# Patient Record
Sex: Female | Born: 1940 | Race: Black or African American | Hispanic: No | Smoking: Current every day smoker
Health system: Southern US, Community
[De-identification: ages and names within clinical notes are randomized; demographics above are authoritative.]

## PROBLEM LIST (undated history)

## (undated) DIAGNOSIS — I8390 Asymptomatic varicose veins of unspecified lower extremity: Secondary | ICD-10-CM

## (undated) DIAGNOSIS — R0902 Hypoxemia: Secondary | ICD-10-CM

## (undated) DIAGNOSIS — I739 Peripheral vascular disease, unspecified: Secondary | ICD-10-CM

## (undated) DIAGNOSIS — M1710 Unilateral primary osteoarthritis, unspecified knee: Secondary | ICD-10-CM

## (undated) DIAGNOSIS — E785 Hyperlipidemia, unspecified: Secondary | ICD-10-CM

## (undated) DIAGNOSIS — I10 Essential (primary) hypertension: Secondary | ICD-10-CM

## (undated) DIAGNOSIS — M79609 Pain in unspecified limb: Secondary | ICD-10-CM

## (undated) DIAGNOSIS — IMO0001 Reserved for inherently not codable concepts without codable children: Secondary | ICD-10-CM

## (undated) DIAGNOSIS — K219 Gastro-esophageal reflux disease without esophagitis: Secondary | ICD-10-CM

## (undated) DIAGNOSIS — M179 Osteoarthritis of knee, unspecified: Secondary | ICD-10-CM

## (undated) DIAGNOSIS — Z8679 Personal history of other diseases of the circulatory system: Secondary | ICD-10-CM

## (undated) HISTORY — DX: Unilateral primary osteoarthritis, unspecified knee: M17.10

## (undated) HISTORY — DX: Reserved for inherently not codable concepts without codable children: IMO0001

## (undated) HISTORY — PX: COLON SURGERY: SHX602

## (undated) HISTORY — PX: CHOLECYSTECTOMY: SHX55

## (undated) HISTORY — DX: Hyperlipidemia, unspecified: E78.5

## (undated) HISTORY — PX: HEMORRHOID SURGERY: SHX153

## (undated) HISTORY — DX: Gastro-esophageal reflux disease without esophagitis: K21.9

## (undated) HISTORY — DX: Essential (primary) hypertension: I10

## (undated) HISTORY — DX: Personal history of other diseases of the circulatory system: Z86.79

## (undated) HISTORY — DX: Hypoxemia: R09.02

## (undated) HISTORY — DX: Asymptomatic varicose veins of unspecified lower extremity: I83.90

## (undated) HISTORY — DX: Osteoarthritis of knee, unspecified: M17.9

## (undated) HISTORY — DX: Peripheral vascular disease, unspecified: I73.9

## (undated) HISTORY — DX: Pain in unspecified limb: M79.609

---
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
## 1999-02-06 ENCOUNTER — Ambulatory Visit (HOSPITAL_COMMUNITY): Admission: RE | Admit: 1999-02-06 | Discharge: 1999-02-06 | Payer: Self-pay | Admitting: Gastroenterology

## 1999-03-30 ENCOUNTER — Ambulatory Visit (HOSPITAL_COMMUNITY): Admission: RE | Admit: 1999-03-30 | Discharge: 1999-03-30 | Payer: Self-pay | Admitting: Pulmonary Disease

## 1999-03-30 ENCOUNTER — Encounter: Payer: Self-pay | Admitting: Pulmonary Disease

## 2005-07-02 ENCOUNTER — Ambulatory Visit (HOSPITAL_COMMUNITY): Admission: RE | Admit: 2005-07-02 | Discharge: 2005-07-02 | Payer: Self-pay | Admitting: Pulmonary Disease

## 2005-07-02 ENCOUNTER — Encounter: Admission: RE | Admit: 2005-07-02 | Discharge: 2005-07-02 | Payer: Self-pay | Admitting: Pulmonary Disease

## 2006-07-02 ENCOUNTER — Encounter: Admission: RE | Admit: 2006-07-02 | Discharge: 2006-07-02 | Payer: Self-pay | Admitting: Pulmonary Disease

## 2006-12-05 ENCOUNTER — Ambulatory Visit (HOSPITAL_COMMUNITY): Admission: RE | Admit: 2006-12-05 | Discharge: 2006-12-05 | Payer: Self-pay | Admitting: Pulmonary Disease

## 2010-10-09 ENCOUNTER — Ambulatory Visit (HOSPITAL_COMMUNITY): Admission: RE | Admit: 2010-10-09 | Discharge: 2010-10-09 | Payer: Self-pay | Admitting: Pulmonary Disease

## 2011-06-27 ENCOUNTER — Encounter: Payer: Self-pay | Admitting: Vascular Surgery

## 2011-06-27 NOTE — Progress Notes (Signed)
[facility] [place]  Referred by: Dr. Corine Shelter, Montez Hageman  Reason for referral: B calf cramping  History of Present Illness  Christina Branch is a 70 y.o. female who presents with chief complaint: B calf cramping after walking.  Onset of symptom occurred, last few month.  Pain is described as cramping after ~1 hour of walking, severity 3-5/10, and associated with walking.  Patient has attempted to treat this pain with rest.  The patient has no rest pain symptoms also and no leg wounds/ulcers.  Atherosclerotic risk factors include: HTN, Smoking, and Hyperlipidemia.  Past Medical History  Diagnosis Date  . Hypertension   . Pain in limb   . Myalgia and myositis, unspecified   . GERD (gastroesophageal reflux disease)   . Hyperlipidemia   . DJD (degenerative joint disease) of knee   . Claudication   . Hypoxemia   . H/O: varicose veins     Right Leg    Past Surgical History  Procedure Date  . Cholecystectomy     Open cholecystectomy  . Colon surgery     partial resection due to large polyps (Benign)  . Hemorrhoid surgery   . Fracture surgery 1953    ORIF right hip    History   Social History  . Marital Status: Divorced    Spouse Name: N/A    Number of Children: N/A  . Years of Education: N/A   Occupational History  . Not on file.   Social History Main Topics  . Smoking status: Current Everyday Smoker -- 0.2 packs/day    Types: Cigarettes  . Smokeless tobacco: Not on file  . Alcohol Use: No  . Drug Use: No  . Sexually Active:    Other Topics Concern  . Not on file   Social History Narrative  . No narrative on file    History reviewed. No pertinent family history.  No current outpatient prescriptions on file.  Allergies as of 06/29/2011 - Review Complete 06/29/2011  Allergen Reaction Noted  . Penicillins Hives 06/29/2011  . Sulfa antibiotics Nausea And Vomiting 06/29/2011    Review of Systems (Positive items in bold and italic,  otherwise negative)  General: Weight loss, Weight gain, Loss of appetite, Fever  Neurologic: Dizziness, Blackouts, Headaches, Seizure  Ear/Nose/Throat: Change in eyesight, Change in hearing, Nose bleeds, Sore throat  Vascular: Pain in legs with walking, Pain in feet while lying flat, Non-healing ulcer, Stroke, "Mini stroke", Slurred speech, Temporary blindness, Blood clot in vein, Phlebitis  Pulmonary: Home oxygen, Productive cough, Bronchitis, Coughing up blood, Asthma, Wheezing  Musculoskeletal: Arthritis, Joint pain, Muscle pain  Cardiac: Chest pain, Chest tightness/pressure, Shortness of breath when lying flat, Shortness of breath with exertion, Palpitations, Heart murmur, Arrythmia, Atrial fibrillation  Hematologic: Bleeding problems, Clotting disorder, Anemia  Psychiatric:  Depression, Anxiety, Attention deficit disorder  Gastrointestinal:  Black stool, Blood in stool, Peptic ulcer disease, Reflux, Hiatal hernia, Trouble swallowing, Diarrhea, Constipation  Urinary:  Kidney disease, Burning with urination, Frequent urination, Difficulty urinating  Skin: Ulcers, Rashes  Physical Examination  Filed Vitals:   06/29/11 1358  BP: 156/79  Pulse: 54  Resp: 12    General: A&O x 3, WDWN, thin  Head: [place]/AT  Ear/Nose/Throat: Hearing grossly intact, nares w/o erythema or drainage, oropharynx w/o Erythema/Exudate  Eyes: PERRLA, EOMI  Neck: Supple, no nuchal rigidity, no palpable LAD  Pulmonary: Sym exp, good air movt, CTAB, no rales, rhonchi, & wheezing  Cardiac: RRR, Nl S1, S2, no Murmurs, rubs  or gallops  Vascular: Vessel Right Left  Radial Palpable Palpable  Ulnary Palpable Palpable  Brachial Palpable Palpable  Carotid Palpable, without bruit Palpable, without bruit  Aorta Non-palpable N/A  Femoral Palpable Palpable  Popliteal Non-palpable Non-palpable  PT Palpable Palpable  DP Palpable Palpable   Gastrointestinal: soft, NTND, -G/R, - HSM, - masses, - CVAT  B  Musculoskeletal: M/S 5/5 throughout   Neurologic: CN 2-12 intact, Pain and light touch intact in extremities, Motor exam as listed above  Psychiatric: Judgment intact, Mood & affect appropriatefor pt's clinical situation  Dermatologic: See M/S exam for extremity exam, no rashes otherwise noted  Lymph : No Cervical, Axillary, or Inguinal lymphadenopathy   Non-Invasive Vascular Imaging  ABI (Date: 06/29/11)  RLE: 1.01, Biphasic PT, Triphasic DP  LLE: 0.96, Biphasic PT, Triphasic DP  Outside Studies/Documentation 5 pages of outside documents were reviewed including: arterial duplex.  Medical Decision Making  Christina Branch is a 70 y.o. female who presents with: sx consistent with BLE intermittent claudication.   Her resting ABI are completely normal but I suspect exercise ABI would reveal some degree of PAD in this patient, but given the high functional status in this patient, I would likely not intervene on her anyway.  I discussed with the patient the natural history of intermittent claudication: 75% of patients have stable or improved symptoms in a year an only 2% require amputation. Eventually 20% may require intervention in a year.  I discussed in depth with the patient the nature of atherosclerosis, and emphasized the importance of maximal medical management including strict control of blood pressure, blood glucose, and lipid levels, obtaining regular exercise, and cessation of smoking.  The patient is aware that without maximal medical management the underlying atherosclerotic disease process will progress, limiting the benefit of any interventions.  I discussed in depth with the patient a walking plan and how to execute such.  The patient is not interested in starting Pletal.  The patient will follow up in 1 year with the following studies BLE ABI.    Thank you for allowing [place] to participate in this patient's care.  Leonides Sake, MD [facility]  [place] Office: [phone] Pager: [phone]

## 2011-06-29 ENCOUNTER — Ambulatory Visit (INDEPENDENT_AMBULATORY_CARE_PROVIDER_SITE_OTHER): Payer: Medicare Other | Admitting: Vascular Surgery

## 2011-06-29 ENCOUNTER — Encounter: Payer: Self-pay | Admitting: Vascular Surgery

## 2011-06-29 ENCOUNTER — Encounter (INDEPENDENT_AMBULATORY_CARE_PROVIDER_SITE_OTHER): Payer: Medicare Other

## 2011-06-29 VITALS — BP 156/79 | HR 54 | Resp 12 | Ht 71.0 in | Wt 160.0 lb

## 2011-06-29 DIAGNOSIS — M79609 Pain in unspecified limb: Secondary | ICD-10-CM

## 2011-06-29 DIAGNOSIS — I739 Peripheral vascular disease, unspecified: Secondary | ICD-10-CM

## 2012-07-03 ENCOUNTER — Encounter: Payer: Self-pay | Admitting: Vascular Surgery

## 2012-07-04 ENCOUNTER — Encounter: Payer: Self-pay | Admitting: Vascular Surgery

## 2012-07-04 ENCOUNTER — Ambulatory Visit (INDEPENDENT_AMBULATORY_CARE_PROVIDER_SITE_OTHER): Payer: Medicare Other | Admitting: Vascular Surgery

## 2012-07-04 ENCOUNTER — Ambulatory Visit (INDEPENDENT_AMBULATORY_CARE_PROVIDER_SITE_OTHER): Payer: Medicare Other | Admitting: *Deleted

## 2012-07-04 VITALS — BP 151/77 | HR 52 | Resp 16 | Ht 71.0 in | Wt 168.4 lb

## 2012-07-04 DIAGNOSIS — M79609 Pain in unspecified limb: Secondary | ICD-10-CM

## 2012-07-04 DIAGNOSIS — I739 Peripheral vascular disease, unspecified: Secondary | ICD-10-CM | POA: Insufficient documentation

## 2012-07-04 NOTE — Progress Notes (Signed)
[facility] [place]  Established Intermittent Claudication  History of Present Illness  Christina Branch is a 71 y.o. (11/23/1941) female who presents with chief complaint: bilateral calf pain.  The patient's symptoms have not progressed.  The patient's symptoms are: cramping and numbness in calves after 1 hour walking  The patient's treatment regimen currently included: maximal medical management and rest.  Past Medical History, Past Surgical History, Social History, Family History, Medications, Allergies, and Review of Systems are unchanged from previous evaluation on 06/29/11.  Physical Examination  Filed Vitals:   07/04/12 1358  BP: 151/77  Pulse: 52  Resp: 16  Height: 5\' 11"  (1.803 m)  Weight: 168 lb 6.4 oz (76.386 kg)  SpO2: 98%   Body mass index is 23.49 kg/(m^2).  General: A&O x 3, WDWN  Pulmonary: Sym exp, good air movt, CTAB, no rales, rhonchi, & wheezing  Cardiac: RRR, Nl S1, S2, no Murmurs, rubs or gallops  Vascular: Vessel Right Left  Radial Palpable Palpable  Brachial Palpable Palpable  Carotid Palpable, without bruit Palpable, without bruit  Aorta Non-palpable N/A  Femoral Palpable Palpable  Popliteal Non-palpable Non-palpable  PT Palpable Palpable  DP Palpable Palpable   Musculoskeletal: M/S 5/5 throughout , Extremities without ischemic changes   Neurologic: Pain and light touch intact in extremities , Motor exam as listed above  Non-Invasive Vascular Imaging ABI (Date: 07/04/12)  RLE: 1.06, PT & DP: triphasic  LLE: 1.01, PT & DP: triphasic    Medical Decision Making  Christina Branch is a 71 y.o. female who presents with: B leg intermittent claudication without evidence of critical limb ischemia.  Based on the patient's vascular studies and examination, I have offered the patient: exercise ABI.  The exercise ABI could be able to uncover any underlying iliac disease.  If she is asx after the exercise ABI, I doubt she has a vascular  based cause of her sx.    I think consideration of possible nerve compression as the cause of her neuropathic like sx should be entertained if the exercise ABI is normal.  She will follow up in 4 weeks after that study is completed  I discussed in depth with the patient the nature of atherosclerosis, and emphasized the importance of maximal medical management including strict control of blood pressure, blood glucose, and lipid levels, antiplatelet agents, obtaining regular exercise, and cessation of smoking.  The patient is aware that without maximal medical management the underlying atherosclerotic disease process will progress, limiting the benefit of any interventions.  Thank you for allowing [place] to participate in this patient's care.  Leonides Sake, MD [facility] [place] Office: [phone] Pager: [phone]  07/04/2012, 2:26 PM

## 2012-07-04 NOTE — Addendum Note (Signed)
Addended by: Fransisco Hertz on: 07/04/2012 03:01 PM   Modules accepted: Orders

## 2012-07-31 ENCOUNTER — Encounter: Payer: Self-pay | Admitting: Vascular Surgery

## 2012-08-01 ENCOUNTER — Ambulatory Visit (INDEPENDENT_AMBULATORY_CARE_PROVIDER_SITE_OTHER): Payer: Medicare Other | Admitting: *Deleted

## 2012-08-01 ENCOUNTER — Encounter: Payer: Self-pay | Admitting: Vascular Surgery

## 2012-08-01 ENCOUNTER — Ambulatory Visit (INDEPENDENT_AMBULATORY_CARE_PROVIDER_SITE_OTHER): Payer: Medicare Other | Admitting: Vascular Surgery

## 2012-08-01 VITALS — BP 151/68 | HR 50 | Resp 16 | Ht 71.0 in | Wt 167.0 lb

## 2012-08-01 DIAGNOSIS — I70219 Atherosclerosis of native arteries of extremities with intermittent claudication, unspecified extremity: Secondary | ICD-10-CM | POA: Insufficient documentation

## 2012-08-01 DIAGNOSIS — I739 Peripheral vascular disease, unspecified: Secondary | ICD-10-CM

## 2012-08-01 NOTE — Progress Notes (Signed)
[facility] [place]  Established Intermittent Claudication  History of Present Illness  Christina Branch is a 71 y.o. ([birth year]) female who presents with chief complaint: R hip and thigh pain.  The patient's symptoms have progressed.  She denies any L side sx currently.  She does not have change in her claudication sx after one mile of walking.  The patient's treatment regimen currently included: maximal medical management.  Past Medical History, Past Surgical History, Social History, Family History, Medications, Allergies, and Review of Systems are unchanged from previous evaluation on 06/29/11.  Physical Examination  Filed Vitals:   08/01/12 1113  BP: 151/68  Pulse: 50  Resp: 16  Height: 5\' 11"  (1.803 m)  Weight: 167 lb (75.751 kg)  SpO2: 100%   Body mass index is 23.29 kg/(m^2).  General: A&O x 3, WDWN  Pulmonary: Sym exp, good air movt, CTAB, no rales, rhonchi, & wheezing  Cardiac: RRR, Nl S1, S2, no Murmurs, rubs or gallops  Vascular: Vessel Right Left  Radial Palpable Palpable  Ulnar Palpable Palpable  Brachial Palpable Palpable  Carotid Palpable, without bruit Palpable, without bruit  Aorta Not palpable N/A  Femoral Palpable Palpable  Popliteal Not palpable Not palpable  PT Palpable Palpable  DP Palpable Palpable   Musculoskeletal: M/S 5/5 throughout , Extremities without ischemic changes   Neurologic: Pain and light touch intact in extremities , Motor exam as listed above  Non-Invasive Vascular Imaging ABI w/ exercise (Date: 08/01/12)  RLE: unchanged  LLE: 15 mm Hg pressure drop  Medical Decision Making  Christina Branch is a 71 y.o. 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  The risk to benefit ratio for angiography  in this patient are not acceptable in this patient.  Her R side sx are not vasculogenic as the ABI was normal even with exercise.    Based on the patient's vascular studies and examination, I have offered the patient: repeat ABI in 3 months.  I counseled the patient to strongly consider complete tobacco cessation.  I discussed in depth with the patient the nature of atherosclerosis, and emphasized the importance of maximal medical management including strict control of blood pressure, blood glucose, and lipid levels, antiplatelet agents, obtaining regular exercise, and cessation of smoking.  The patient is aware that without maximal medical management the underlying atherosclerotic disease process will progress, limiting the benefit of any interventions.  Thank you for allowing [place] to participate in this patient's care.  Leonides Sake, MD [facility] [place] Office: [phone] Pager: [phone]  08/01/2012, 12:36 PM

## 2012-08-01 NOTE — Addendum Note (Signed)
Addended by: Sharee Pimple on: 08/01/2012 01:53 PM   Modules accepted: Orders

## 2012-10-31 ENCOUNTER — Ambulatory Visit: Payer: Medicare Other | Admitting: Vascular Surgery

## 2013-01-27 ENCOUNTER — Other Ambulatory Visit (HOSPITAL_COMMUNITY): Payer: Self-pay | Admitting: Pulmonary Disease

## 2013-02-03 ENCOUNTER — Ambulatory Visit (HOSPITAL_COMMUNITY)
Admission: RE | Admit: 2013-02-03 | Discharge: 2013-02-03 | Disposition: A | Discharge status: Home / Self Care | Payer: Medicare Other | Source: Ambulatory Visit | Attending: Pulmonary Disease | Admitting: Pulmonary Disease

## 2013-02-03 DIAGNOSIS — I6529 Occlusion and stenosis of unspecified carotid artery: Secondary | ICD-10-CM

## 2013-02-03 DIAGNOSIS — I10 Essential (primary) hypertension: Secondary | ICD-10-CM | POA: Insufficient documentation

## 2013-02-03 DIAGNOSIS — F17200 Nicotine dependence, unspecified, uncomplicated: Secondary | ICD-10-CM | POA: Insufficient documentation

## 2013-02-03 NOTE — Progress Notes (Signed)
VASCULAR LAB PRELIMINARY  PRELIMINARY  PRELIMINARY  PRELIMINARY  Carotid duplex completed.    Preliminary report:  Bilateral:  No evidence of hemodynamically significant internal carotid artery stenosis.   Vertebral artery flow is antegrade.     SLAUGHTER, [place], RVS 02/03/2013, 10:50 AM

## 2013-07-20 ENCOUNTER — Other Ambulatory Visit (HOSPITAL_COMMUNITY): Payer: Self-pay | Admitting: Pulmonary Disease

## 2013-07-20 ENCOUNTER — Ambulatory Visit (HOSPITAL_COMMUNITY)
Admission: RE | Admit: 2013-07-20 | Discharge: 2013-07-20 | Disposition: A | Discharge status: Home / Self Care | Payer: Medicare Other | Source: Ambulatory Visit | Attending: Pulmonary Disease | Admitting: Pulmonary Disease

## 2013-07-20 DIAGNOSIS — M1990 Unspecified osteoarthritis, unspecified site: Secondary | ICD-10-CM

## 2013-07-20 DIAGNOSIS — M542 Cervicalgia: Secondary | ICD-10-CM | POA: Insufficient documentation

## 2013-10-13 ENCOUNTER — Telehealth: Payer: Self-pay | Admitting: Vascular Surgery

## 2013-10-16 NOTE — Telephone Encounter (Signed)
patient will call back to r/s her appt. for abi's and ov with suzanne which i informed her are overdue.

## 2014-01-28 ENCOUNTER — Emergency Department (HOSPITAL_COMMUNITY)
Admission: EM | Admit: 2014-01-28 | Discharge: 2014-01-28 | Disposition: A | Discharge status: Home / Self Care | Payer: Medicare Other | Attending: Emergency Medicine | Admitting: Emergency Medicine

## 2014-01-28 ENCOUNTER — Encounter (HOSPITAL_COMMUNITY): Payer: Self-pay | Admitting: Emergency Medicine

## 2014-01-28 DIAGNOSIS — L0231 Cutaneous abscess of buttock: Secondary | ICD-10-CM | POA: Insufficient documentation

## 2014-01-28 DIAGNOSIS — L03317 Cellulitis of buttock: Principal | ICD-10-CM

## 2014-01-28 DIAGNOSIS — M1710 Unilateral primary osteoarthritis, unspecified knee: Secondary | ICD-10-CM | POA: Insufficient documentation

## 2014-01-28 DIAGNOSIS — F17200 Nicotine dependence, unspecified, uncomplicated: Secondary | ICD-10-CM | POA: Insufficient documentation

## 2014-01-28 DIAGNOSIS — K219 Gastro-esophageal reflux disease without esophagitis: Secondary | ICD-10-CM | POA: Insufficient documentation

## 2014-01-28 DIAGNOSIS — Z880 Allergy status to penicillin: Secondary | ICD-10-CM | POA: Insufficient documentation

## 2014-01-28 DIAGNOSIS — Z862 Personal history of diseases of the blood and blood-forming organs and certain disorders involving the immune mechanism: Secondary | ICD-10-CM | POA: Insufficient documentation

## 2014-01-28 DIAGNOSIS — Z79899 Other long term (current) drug therapy: Secondary | ICD-10-CM | POA: Insufficient documentation

## 2014-01-28 DIAGNOSIS — Z8639 Personal history of other endocrine, nutritional and metabolic disease: Secondary | ICD-10-CM | POA: Insufficient documentation

## 2014-01-28 DIAGNOSIS — I10 Essential (primary) hypertension: Secondary | ICD-10-CM | POA: Insufficient documentation

## 2014-01-28 DIAGNOSIS — L0291 Cutaneous abscess, unspecified: Secondary | ICD-10-CM

## 2014-01-28 DIAGNOSIS — IMO0002 Reserved for concepts with insufficient information to code with codable children: Secondary | ICD-10-CM | POA: Insufficient documentation

## 2014-01-28 MED ORDER — CLINDAMYCIN HCL 150 MG PO CAPS: 450.0000 mg | ORAL_CAPSULE | Freq: Three times a day (TID) | ORAL | Status: DC

## 2014-01-28 MED ORDER — HYDROCODONE-ACETAMINOPHEN 5-325 MG PO TABS: 2.0000 | ORAL_TABLET | ORAL | Status: DC | PRN

## 2014-01-28 NOTE — Discharge Instructions (Signed)
Abscess An abscess is an infected area that contains a collection of pus and debris.It can occur in almost any part of the body. An abscess is also known as a furuncle or boil. CAUSES  An abscess occurs when tissue gets infected. This can occur from blockage of oil or sweat glands, infection of hair follicles, or a minor injury to the skin. As the body tries to fight the infection, pus collects in the area and creates pressure under the skin. This pressure causes pain. People with weakened immune systems have difficulty fighting infections and get certain abscesses more often.  SYMPTOMS Usually an abscess develops on the skin and becomes a painful mass that is red, warm, and tender. If the abscess forms under the skin, you may feel a moveable soft area under the skin. Some abscesses break open (rupture) on their own, but most will continue to get worse without care. The infection can spread deeper into the body and eventually into the bloodstream, causing you to feel ill.  DIAGNOSIS  Your caregiver will take your medical history and perform a physical exam. A sample of fluid may also be taken from the abscess to determine what is causing your infection. TREATMENT  Your caregiver may prescribe antibiotic medicines to fight the infection. However, taking antibiotics alone usually does not cure an abscess. Your caregiver may need to make a small cut (incision) in the abscess to drain the pus. In some cases, gauze is packed into the abscess to reduce pain and to continue draining the area. HOME CARE INSTRUCTIONS   Only take over-the-counter or prescription medicines for pain, discomfort, or fever as directed by your caregiver.  If you were prescribed antibiotics, take them as directed. Finish them even if you start to feel better.  If gauze is used, follow your caregiver's directions for changing the gauze.  To avoid spreading the infection:  Keep your draining abscess covered with a  bandage.  Wash your hands well.  Do not share personal care items, towels, or whirlpools with others.  Avoid skin contact with others.  Keep your skin and clothes clean around the abscess.  Keep all follow-up appointments as directed by your caregiver. SEEK MEDICAL CARE IF:   You have increased pain, swelling, redness, fluid drainage, or bleeding.  You have muscle aches, chills, or a general ill feeling.  You have a fever. MAKE SURE YOU:   Understand these instructions.  Will watch your condition.  Will get help right away if you are not doing well or get worse. Document Released: 08/22/2005 Document Revised: 05/13/2012 Document Reviewed: 01/25/2012 [facility] Patient Information 2014 [place].  Abscess Care After An abscess (also called a boil or furuncle) is an infected area that contains a collection of pus. Signs and symptoms of an abscess include pain, tenderness, redness, or hardness, or you may feel a moveable soft area under your skin. An abscess can occur anywhere in the body. The infection may spread to surrounding tissues causing cellulitis. A cut (incision) by the surgeon was made over your abscess and the pus was drained out. Gauze may have been packed into the space to provide a drain that will allow the cavity to heal from the inside outwards. The boil may be painful for 5 to 7 days. Most people with a boil do not have high fevers. Your abscess, if seen early, may not have localized, and may not have been lanced. If not, another appointment may be required for this if it does  not get better on its own or with medications. HOME CARE INSTRUCTIONS   Only take over-the-counter or prescription medicines for pain, discomfort, or fever as directed by your caregiver.  When you bathe, soak and then remove gauze or iodoform packs at least daily or as directed by your caregiver. You may then wash the wound gently with mild soapy water. Repack with gauze or do as your  caregiver directs. SEEK IMMEDIATE MEDICAL CARE IF:   You develop increased pain, swelling, redness, drainage, or bleeding in the wound site.  You develop signs of generalized infection including muscle aches, chills, fever, or a general ill feeling.  An oral temperature above 102 F (38.9 C) develops, not controlled by medication. See your caregiver for a recheck if you develop any of the symptoms described above. If medications (antibiotics) were prescribed, take them as directed. Document Released: 05/31/2005 Document Revised: 02/04/2012 Document Reviewed: 01/26/2008 [facility] Patient Information 2014 [place].   Take antibiotic as prescribed Return here or to your PCP in 2 days for a wound check and removal of the packing Return if symptoms worsen or if fever  Take ibuprofen for pain May take hydrocodone for moderate to severe pain

## 2014-01-28 NOTE — ED Provider Notes (Signed)
CSN: [id number]     Arrival date & time 01/28/14  1444 History  This chart was scribed for non-physician practitioner  Elisha Headland, NP, working with Janice Norrie, MD, by Neta Ehlers, ED Scribe. This patient was seen in room [place] and the patient's care was started at 4:16 PM.  First MD Initiated Contact with Patient 01/28/14 1541     Chief Complaint  Patient presents with  . Abscess    HPI HPI Comments: Christina Branch is a 73 y.o. female who presents to the Emergency Department complaining of abscess to her right buttocks which has been present for at least four days. She reports the abscess seems to have "popped," though she is unsure if it drained.  She denies pain associated with the site. She also denies a recent fever; in the ED her temperature is 98.52F. Additionally, she denies a h/o abscesses.   Past Medical History  Diagnosis Date  . Hypertension   . Pain in limb   . Myalgia and myositis, unspecified   . GERD (gastroesophageal reflux disease)   . Hyperlipidemia   . DJD (degenerative joint disease) of knee   . Claudication   . Hypoxemia   . H/O: varicose veins     Right Leg  . Varicose veins    Past Surgical History  Procedure Laterality Date  . Cholecystectomy      Open cholecystectomy  . Colon surgery      partial resection due to large polyps (Benign)  . Hemorrhoid surgery    . Fracture surgery  1953    ORIF right hip   Family History  Problem Relation Age of Onset  . Cancer Mother    History  Substance Use Topics  . Smoking status: Current Every Day Smoker -- 0.25 packs/day    Types: Cigarettes  . Smokeless tobacco: Never Used  . Alcohol Use: No   No OB history provided.  Review of Systems  Constitutional: Negative for fever.  Skin: Positive for color change and wound.  All other systems reviewed and are negative.   Allergies  Penicillins and Sulfa antibiotics  Home Medications   Current Outpatient Rx  Name  Route  Sig  Dispense  Refill   . amLODipine (NORVASC) 10 MG tablet   Oral   Take 10 mg by mouth daily.          . cyclobenzaprine (FLEXERIL) 10 MG tablet   Oral   Take 10 mg by mouth 2 (two) times daily. For muscle spasms         . desoximetasone (TOPICORT) 0.25 % cream   Topical   Apply 1 application topically 2 (two) times daily.         Marland Kitchen HYDROcodone-ibuprofen (VICOPROFEN) 7.5-200 MG per tablet   Oral   Take 1 tablet by mouth every 6 (six) hours as needed for moderate pain.          . Multiple Vitamins-Minerals (CENTRUM SILVER PO)   Oral   Take 1 tablet by mouth daily.          Marland Kitchen omeprazole (PRILOSEC) 20 MG capsule   Oral   Take 20 mg by mouth daily.          Triage Vitals: BP 171/73  Pulse 68  Temp(Src) 98.5 F (36.9 C) (Oral)  Resp 16  SpO2 97%  Physical Exam  Nursing note and vitals reviewed. Constitutional: She is oriented to person, place, and time. She appears well-developed and well-nourished. No distress.  HENT:  Head: Normocephalic and atraumatic.  Eyes: EOM are normal.  Neck: Neck supple. No tracheal deviation present.  Cardiovascular: Normal rate.   Pulmonary/Chest: Effort normal. No respiratory distress.  Musculoskeletal: Normal range of motion.  Neurological: She is alert and oriented to person, place, and time.  Skin: Skin is warm and dry. There is erythema.  4 cm circumscribed area, soft and indurated in the center, to the right buttocks. Moderate erythema. Fluctuant at the middle.   Psychiatric: She has a normal mood and affect. Her behavior is normal.    ED Course  Procedures (including critical care time)  DIAGNOSTIC STUDIES: Oxygen Saturation is 97% on room air, normal by my interpretation.    COORDINATION OF CARE:  4:18 PM- Discussed treatment plan with patient, which includes an incision and drainage, and the patient agreed to the plan.   5:01 PM- INCISION AND DRAINAGE PROCEDURE NOTE: Patient identification was confirmed and verbal consent was  obtained. This procedure was performed by Elisha Headland, NP, at 5:01 PM. Site: Right buttocks Sterile procedures observed Needle size: 23 Anesthetic used (type and amt): 2% lido with epi; 7 cc Blade size:  Drainage: Copious amounts of purulent fluid Complexity: Complex Packing used Site anesthetized, incision made over site, wound drained and explored loculations, rinsed with copious amounts of normal saline, wound packed with sterile gauze, covered with dry, sterile dressing.  Pt tolerated procedure well without complications.  Instructions for care discussed verbally and pt provided with additional written instructions for homecare and f/u.   Labs Review Labs Reviewed - No data to display Imaging Review No results found.   EKG Interpretation None      MDM   Final diagnoses:  Abscess   I&D of abscess on right buttock. Copious amount of purulent fluid drained. Afebrile. Erythema and edema localized. No red streaking. Discussed plan of care with pt and she agrees. Wound care instructions given. Prescription for Clindamycin and Norco given. Return precaution discussed.  I personally performed the services described in this documentation, which was scribed in my presence. The recorded information has been reviewed and is accurate.      Elisha Headland, NP 02/04/14 1430

## 2014-01-28 NOTE — ED Notes (Signed)
Per pt, states abscess on right buttocks-states she think it "popped"

## 2014-01-30 ENCOUNTER — Encounter (HOSPITAL_COMMUNITY): Payer: Self-pay | Admitting: Emergency Medicine

## 2014-01-30 ENCOUNTER — Emergency Department (HOSPITAL_COMMUNITY)
Admission: EM | Admit: 2014-01-30 | Discharge: 2014-01-30 | Disposition: A | Discharge status: Home / Self Care | Payer: Medicare Other | Attending: Emergency Medicine | Admitting: Emergency Medicine

## 2014-01-30 DIAGNOSIS — Z79899 Other long term (current) drug therapy: Secondary | ICD-10-CM | POA: Insufficient documentation

## 2014-01-30 DIAGNOSIS — IMO0002 Reserved for concepts with insufficient information to code with codable children: Secondary | ICD-10-CM | POA: Insufficient documentation

## 2014-01-30 DIAGNOSIS — I10 Essential (primary) hypertension: Secondary | ICD-10-CM | POA: Insufficient documentation

## 2014-01-30 DIAGNOSIS — Z880 Allergy status to penicillin: Secondary | ICD-10-CM | POA: Insufficient documentation

## 2014-01-30 DIAGNOSIS — Z4801 Encounter for change or removal of surgical wound dressing: Secondary | ICD-10-CM | POA: Insufficient documentation

## 2014-01-30 DIAGNOSIS — F17200 Nicotine dependence, unspecified, uncomplicated: Secondary | ICD-10-CM | POA: Insufficient documentation

## 2014-01-30 DIAGNOSIS — Z5189 Encounter for other specified aftercare: Secondary | ICD-10-CM

## 2014-01-30 DIAGNOSIS — Z48817 Encounter for surgical aftercare following surgery on the skin and subcutaneous tissue: Secondary | ICD-10-CM | POA: Insufficient documentation

## 2014-01-30 DIAGNOSIS — E785 Hyperlipidemia, unspecified: Secondary | ICD-10-CM | POA: Insufficient documentation

## 2014-01-30 DIAGNOSIS — K219 Gastro-esophageal reflux disease without esophagitis: Secondary | ICD-10-CM | POA: Insufficient documentation

## 2014-01-30 DIAGNOSIS — M1710 Unilateral primary osteoarthritis, unspecified knee: Secondary | ICD-10-CM | POA: Insufficient documentation

## 2014-01-30 NOTE — ED Notes (Signed)
Pt presents today with request to have wound checked and to have her packing removed from the site of an I&D that was done to the patient's right buttocks. Pt was seen on 01/28/2014 here for the I&D. Pt denies complications, vitals are WDL, and pt is in NAD.

## 2014-01-30 NOTE — ED Provider Notes (Addendum)
CSN: [id number]     Arrival date & time 01/30/14  1510 History   First MD Initiated Contact with Patient 01/30/14 1549     Chief Complaint  Patient presents with  . Wound Check     (Consider location/radiation/quality/duration/timing/severity/associated sxs/prior Treatment) Patient is a 73 y.o. female presenting with wound check. The history is provided by the patient.  Wound Check Pertinent negatives include no chest pain, no abdominal pain, no headaches and no shortness of breath.   patient status post I&D of abscess on the right buttocks done at [facility] on March 5. Patient presents for wound check. Patient states that the wound area is feeling much better.  Past Medical History  Diagnosis Date  . Hypertension   . Pain in limb   . Myalgia and myositis, unspecified   . GERD (gastroesophageal reflux disease)   . Hyperlipidemia   . DJD (degenerative joint disease) of knee   . Claudication   . Hypoxemia   . H/O: varicose veins     Right Leg  . Varicose veins    Past Surgical History  Procedure Laterality Date  . Cholecystectomy      Open cholecystectomy  . Colon surgery      partial resection due to large polyps (Benign)  . Hemorrhoid surgery    . Fracture surgery  1953    ORIF right hip   Family History  Problem Relation Age of Onset  . Cancer Mother    History  Substance Use Topics  . Smoking status: Current Every Day Smoker -- 0.25 packs/day    Types: Cigarettes  . Smokeless tobacco: Never Used  . Alcohol Use: No   OB History   Grav Para Term Preterm Abortions TAB SAB Ect Mult Living                 Review of Systems  Constitutional: Negative for fever.  Respiratory: Negative for shortness of breath.   Cardiovascular: Negative for chest pain.  Gastrointestinal: Negative for abdominal pain.  Skin: Positive for wound.  Neurological: Negative for headaches.  Hematological: Does not bruise/bleed easily.  Psychiatric/Behavioral: Negative for confusion.       Allergies  Penicillins and Sulfa antibiotics  Home Medications   Current Outpatient Rx  Name  Route  Sig  Dispense  Refill  . amLODipine (NORVASC) 10 MG tablet   Oral   Take 10 mg by mouth daily.          . clindamycin (CLEOCIN) 150 MG capsule   Oral   Take 3 capsules (450 mg total) by mouth 3 (three) times daily.   45 capsule   0   . cyclobenzaprine (FLEXERIL) 10 MG tablet   Oral   Take 10 mg by mouth 2 (two) times daily. For muscle spasms         . desoximetasone (TOPICORT) 0.25 % cream   Topical   Apply 1 application topically 2 (two) times daily.         Marland Kitchen HYDROcodone-acetaminophen (NORCO/VICODIN) 5-325 MG per tablet   Oral   Take 2 tablets by mouth every 4 (four) hours as needed.   6 tablet   0   . HYDROcodone-ibuprofen (VICOPROFEN) 7.5-200 MG per tablet   Oral   Take 1 tablet by mouth every 6 (six) hours as needed for moderate pain.          . Multiple Vitamins-Minerals (CENTRUM SILVER PO)   Oral   Take 1 tablet by mouth daily.          Marland Kitchen  omeprazole (PRILOSEC) 20 MG capsule   Oral   Take 20 mg by mouth daily.          BP 149/81  Pulse 56  Temp(Src) 98.2 F (36.8 C) (Oral)  Resp 16  SpO2 95% Physical Exam  Nursing note and vitals reviewed. Constitutional: She is oriented to person, place, and time. She appears well-developed and well-nourished. No distress.  HENT:  Head: Normocephalic and atraumatic.  Eyes: Conjunctivae and EOM are normal. Pupils are equal, round, and reactive to light.  Neck: Normal range of motion.  Pulmonary/Chest: Effort normal. No respiratory distress.  Abdominal: Soft. There is no tenderness.  Musculoskeletal:  Right buttocks area with minimal erythema measuring about the 3 cm. I&D opening of with the packing gauze in place. This was removed. No significant induration no further fluctuance minimal tenderness.  Neurological: She is alert and oriented to person, place, and time. No cranial nerve deficit. She  exhibits normal muscle tone. Coordination normal.  Skin: Skin is warm.    ED Course  Procedures (including critical care time) Labs Review Labs Reviewed - No data to display Imaging Review No results found.   EKG Interpretation None      MDM   Final diagnoses:  Wound check, abscess    Patient status post I&D of abscess on March 5 here with packing placed. Patient returns for wound check and packing removal. The abscess today is healing well significantly deep abscess cavity. Packing all removed and then replaced with quarter inch packing. And redress. Patient will need at this packing removed again in 2-3 days. Patient will followup either here or with her primary care doctor.    Mervin Kung, MD 01/30/14 0973  Mervin Kung, MD 01/30/14 [phone]

## 2014-01-30 NOTE — Discharge Instructions (Signed)
Abscess repacked today. We'll need to have packing removed again in 2-3 days. That can be done here or you can followup with your regular doctor. Return earlier for any new or worse symptoms. Abscess appears to be healing well up to this point.

## 2014-02-04 NOTE — ED Provider Notes (Signed)
Medical screening examination/treatment/procedure(s) were performed by non-physician practitioner and as supervising physician I was immediately available for consultation/collaboration.   EKG Interpretation None       Rolland Porter, MD, Abram Sander   Janice Norrie, MD 02/04/14 [phone]

## 2014-06-17 ENCOUNTER — Emergency Department (HOSPITAL_COMMUNITY): Payer: Medicare Other

## 2014-06-17 ENCOUNTER — Encounter (HOSPITAL_COMMUNITY): Payer: Self-pay | Admitting: Emergency Medicine

## 2014-06-17 ENCOUNTER — Emergency Department (HOSPITAL_COMMUNITY)
Admission: EM | Admit: 2014-06-17 | Discharge: 2014-06-18 | Disposition: A | Discharge status: Home / Self Care | Payer: Medicare Other | Attending: Emergency Medicine | Admitting: Emergency Medicine

## 2014-06-17 DIAGNOSIS — Z8739 Personal history of other diseases of the musculoskeletal system and connective tissue: Secondary | ICD-10-CM | POA: Diagnosis not present

## 2014-06-17 DIAGNOSIS — Z880 Allergy status to penicillin: Secondary | ICD-10-CM | POA: Insufficient documentation

## 2014-06-17 DIAGNOSIS — R079 Chest pain, unspecified: Secondary | ICD-10-CM | POA: Diagnosis present

## 2014-06-17 DIAGNOSIS — Z862 Personal history of diseases of the blood and blood-forming organs and certain disorders involving the immune mechanism: Secondary | ICD-10-CM | POA: Diagnosis not present

## 2014-06-17 DIAGNOSIS — I10 Essential (primary) hypertension: Secondary | ICD-10-CM | POA: Diagnosis not present

## 2014-06-17 DIAGNOSIS — R040 Epistaxis: Secondary | ICD-10-CM | POA: Insufficient documentation

## 2014-06-17 DIAGNOSIS — Z7982 Long term (current) use of aspirin: Secondary | ICD-10-CM | POA: Insufficient documentation

## 2014-06-17 DIAGNOSIS — K219 Gastro-esophageal reflux disease without esophagitis: Secondary | ICD-10-CM | POA: Insufficient documentation

## 2014-06-17 DIAGNOSIS — F17200 Nicotine dependence, unspecified, uncomplicated: Secondary | ICD-10-CM | POA: Diagnosis not present

## 2014-06-17 DIAGNOSIS — R0789 Other chest pain: Secondary | ICD-10-CM | POA: Insufficient documentation

## 2014-06-17 DIAGNOSIS — Z79899 Other long term (current) drug therapy: Secondary | ICD-10-CM | POA: Diagnosis not present

## 2014-06-17 DIAGNOSIS — Z8639 Personal history of other endocrine, nutritional and metabolic disease: Secondary | ICD-10-CM | POA: Insufficient documentation

## 2014-06-17 LAB — I-STAT TROPONIN, ED: Troponin i, poc: 0.01 ng/mL (ref 0.00–0.08)

## 2014-06-17 LAB — BASIC METABOLIC PANEL
Anion gap: 15 (ref 5–15)
BUN: 14 mg/dL (ref 6–23)
CO2: 22 mEq/L (ref 19–32)
Calcium: 10.5 mg/dL (ref 8.4–10.5)
Chloride: 103 mEq/L (ref 96–112)
Creatinine, Ser: 0.58 mg/dL (ref 0.50–1.10)
GFR calc Af Amer: >90 mL/min (ref >90)
GFR calc non Af Amer: 89 mL/min — ABNORMAL LOW (ref >90)
Glucose, Bld: 91 mg/dL (ref 70–99)
Potassium: 3.3 mEq/L — ABNORMAL LOW (ref 3.7–5.3)
Sodium: 140 mEq/L (ref 137–147)

## 2014-06-17 LAB — CBC
HCT: 51.9 % — ABNORMAL HIGH (ref 36.0–46.0)
Hemoglobin: 17.1 g/dL — ABNORMAL HIGH (ref 12.0–15.0)
MCH: 30.9 pg (ref 26.0–34.0)
MCHC: 32.9 g/dL (ref 30.0–36.0)
MCV: 93.9 fL (ref 78.0–100.0)
PLATELETS: 261 10*3/uL (ref 150–400)
RBC: 5.53 MIL/uL — ABNORMAL HIGH (ref 3.87–5.11)
RDW: 14.5 % (ref 11.5–15.5)
WBC: 8.1 10*3/uL (ref 4.0–10.5)

## 2014-06-17 MED ORDER — GABAPENTIN 100 MG PO CAPS: 100.0000 mg | ORAL_CAPSULE | Freq: Three times a day (TID) | ORAL | Status: DC

## 2014-06-17 NOTE — Discharge Instructions (Signed)

## 2014-06-17 NOTE — ED Notes (Signed)
Bed: WA12 Expected date:  Expected time:  Means of arrival:  Comments: Tr 1 

## 2014-06-17 NOTE — ED Provider Notes (Signed)
CSN: [id number]     Arrival date & time 06/17/14  2145 History   First MD Initiated Contact with Patient 06/17/14 2216     Chief Complaint  Patient presents with  . Chest Pain  . Epistaxis     (Consider location/radiation/quality/duration/timing/severity/associated sxs/prior Treatment) Patient is a 73 y.o. female presenting with chest pain and nosebleeds.  Chest Pain Associated symptoms: no abdominal pain, no back pain, no cough, no dizziness, no fever, no headache, no nausea, no numbness, no palpitations, no shortness of breath, not vomiting and no weakness   Epistaxis Associated symptoms: no cough, no dizziness, no fever and no headaches    Patient presents with sharp stabbing type pain under her left breast is episodic in nature. She states is going on for the past 2 weeks. Episodes lasts less than 3 seconds. She currently has pain. The pain is not associated with breathing or movement. She's had no shortness of breath, nausea vomiting. She's had no fever or chills. She denies any chest trauma. And is being evaluated by her primary Dr. for this pain he felt this may be neuropathic. Denies any recent rashes.  Patient also had a brief episode of epistaxis earlier this evening. States he was bleeding from the right near. Bleeding was controlled with pressure. No previous history of epistaxis. No dizziness or lightheadedness. No vision changes.  Past Medical History  Diagnosis Date  . Hypertension   . Pain in limb   . Myalgia and myositis, unspecified   . GERD (gastroesophageal reflux disease)   . Hyperlipidemia   . DJD (degenerative joint disease) of knee   . Claudication   . Hypoxemia   . H/O: varicose veins     Right Leg  . Varicose veins    Past Surgical History  Procedure Laterality Date  . Cholecystectomy      Open cholecystectomy  . Colon surgery      partial resection due to large polyps (Benign)  . Hemorrhoid surgery    . Fracture surgery  1953    ORIF right hip    Family History  Problem Relation Age of Onset  . Cancer Mother    History  Substance Use Topics  . Smoking status: Current Every Day Smoker -- 0.25 packs/day    Types: Cigarettes  . Smokeless tobacco: Never Used  . Alcohol Use: No   OB History   Grav Para Term Preterm Abortions TAB SAB Ect Mult Living                 Review of Systems  Unable to perform ROS Constitutional: Negative for fever and chills.  HENT: Positive for nosebleeds.   Respiratory: Negative for cough and shortness of breath.   Cardiovascular: Positive for chest pain. Negative for palpitations and leg swelling.  Gastrointestinal: Negative for nausea, vomiting and abdominal pain.  Genitourinary: Negative for dysuria.  Musculoskeletal: Negative for back pain, myalgias, neck pain and neck stiffness.  Skin: Negative for rash and wound.  Neurological: Negative for dizziness, syncope, weakness, light-headedness, numbness and headaches.  All other systems reviewed and are negative.     Allergies  Penicillins and Sulfa antibiotics  Home Medications   Prior to Admission medications   Medication Sig Start Date End Date Taking? Authorizing Provider  amLODipine (NORVASC) 10 MG tablet Take 10 mg by mouth daily.  03/31/12  Yes Historical Provider, MD  aspirin 81 MG chewable tablet Chew 81 mg by mouth daily.   Yes Historical Provider, MD  HYDROcodone-ibuprofen Burtis Junes)  7.5-200 MG per tablet Take 1 tablet by mouth every 6 (six) hours as needed for moderate pain.  06/03/12  Yes Historical Provider, MD  Multiple Vitamins-Minerals (CENTRUM SILVER PO) Take 1 tablet by mouth daily.    Yes Historical Provider, MD  omeprazole (PRILOSEC) 20 MG capsule Take 20 mg by mouth daily.   Yes Historical Provider, MD   BP 191/65  Temp(Src) 98.5 F (36.9 C) (Oral)  Resp 21  SpO2 95% Physical Exam  Nursing note and vitals reviewed. Constitutional: She is oriented to person, place, and time. She appears well-developed and  well-nourished. No distress.  HENT:  Head: Normocephalic and atraumatic.  Mouth/Throat: Oropharynx is clear and moist.  Small amount of dried blood in the right nare. No active bleeding.  Eyes: EOM are normal. Pupils are equal, round, and reactive to light.  Neck: Normal range of motion. Neck supple.  Cardiovascular: Normal rate and regular rhythm.  Exam reveals no gallop and no friction rub.   No murmur heard. Pulmonary/Chest: Effort normal and breath sounds normal. No respiratory distress. She has no wheezes. She has no rales. She exhibits no tenderness.  Abdominal: Soft. Bowel sounds are normal. She exhibits no distension and no mass. There is no tenderness. There is no rebound and no guarding.  Musculoskeletal: Normal range of motion. She exhibits no edema and no tenderness.  No calf swelling or tenderness.  Neurological: She is alert and oriented to person, place, and time.  Skin: Skin is warm and dry. No rash noted. No erythema.  Psychiatric: She has a normal mood and affect. Her behavior is normal.    ED Course  Procedures (including critical care time) Labs Review Labs Reviewed  CBC - Abnormal; Notable for the following:    RBC 5.53 (*)    Hemoglobin 17.1 (*)    HCT 51.9 (*)    All other components within normal limits  BASIC METABOLIC PANEL  I-STAT TROPOININ, ED    Imaging Review No results found.   EKG Interpretation   Date/Time:  Thursday June 17 2014 21:50:26 EDT Ventricular Rate:  63 PR Interval:  158 QRS Duration: 65 QT Interval:  400 QTC Calculation: 409 R Axis:   40 Text Interpretation:  Sinus rhythm Supraventricular bigeminy Left atrial  enlargement Confirmed by Lita Mains  MD, Donabelle Molden ([id number]) on 06/17/2014  11:52:49 PM      MDM   Final diagnoses:  None   Patient with very atypical left chest pain. Suspicion for coronary artery disease is very low. Chest pain is sharp and episodic lasting a few seconds. This been going on for the last 2 weeks. She  has a normal troponin and non-concerning EKG. Patient has close followup with primary MD. return precautions have been given. Given the nature of the pain is maybe neuropathic. Start on trial of Neurontin.      Julianne Rice, MD 06/17/14 [phone]

## 2014-06-17 NOTE — ED Notes (Signed)
Per pt report: pt has had chest pain in her left breast area for the past 2 weeks.  Pt reports the pain seems to go to her back.  Pt denies SOB, N/V but does have a headache. Pt reports she saw her PCP (Dr. Leonel Ramsay) for her chest pain and then when the pt left the office, pt experienced a nosebleed. Bleeding is controlled. Pt a/o x 4.  Skin warm and dry. Pt ambulatory.

## 2014-06-23 ENCOUNTER — Ambulatory Visit (HOSPITAL_COMMUNITY)
Admission: RE | Admit: 2014-06-23 | Discharge: 2014-06-23 | Disposition: A | Discharge status: Home / Self Care | Payer: Medicare Other | Source: Ambulatory Visit | Attending: Pulmonary Disease | Admitting: Pulmonary Disease

## 2014-06-23 ENCOUNTER — Other Ambulatory Visit (HOSPITAL_COMMUNITY): Payer: Self-pay | Admitting: Pulmonary Disease

## 2014-06-23 DIAGNOSIS — M47814 Spondylosis without myelopathy or radiculopathy, thoracic region: Secondary | ICD-10-CM | POA: Diagnosis not present

## 2014-06-23 DIAGNOSIS — R079 Chest pain, unspecified: Secondary | ICD-10-CM | POA: Insufficient documentation

## 2014-08-20 ENCOUNTER — Other Ambulatory Visit: Payer: Self-pay | Admitting: Orthopedic Surgery

## 2014-08-20 DIAGNOSIS — M25562 Pain in left knee: Secondary | ICD-10-CM

## 2014-08-25 ENCOUNTER — Other Ambulatory Visit: Payer: Medicare Other

## 2014-08-26 ENCOUNTER — Other Ambulatory Visit: Payer: Medicare Other

## 2014-08-31 ENCOUNTER — Other Ambulatory Visit: Payer: Medicare Other

## 2014-08-31 ENCOUNTER — Other Ambulatory Visit: Payer: Self-pay | Admitting: Orthopedic Surgery

## 2014-08-31 DIAGNOSIS — M25562 Pain in left knee: Secondary | ICD-10-CM

## 2014-09-06 ENCOUNTER — Ambulatory Visit
Admission: RE | Admit: 2014-09-06 | Discharge: 2014-09-06 | Disposition: A | Discharge status: Home / Self Care | Payer: Medicare Other | Source: Ambulatory Visit | Attending: Orthopedic Surgery | Admitting: Orthopedic Surgery

## 2014-09-06 DIAGNOSIS — M25562 Pain in left knee: Secondary | ICD-10-CM

## 2016-09-20 ENCOUNTER — Other Ambulatory Visit (HOSPITAL_COMMUNITY): Payer: Self-pay | Admitting: Pulmonary Disease

## 2016-09-20 DIAGNOSIS — R51 Headache: Principal | ICD-10-CM

## 2016-09-20 DIAGNOSIS — R519 Headache, unspecified: Secondary | ICD-10-CM

## 2016-09-25 ENCOUNTER — Ambulatory Visit (HOSPITAL_COMMUNITY)
Admission: RE | Admit: 2016-09-25 | Discharge: 2016-09-25 | Disposition: A | Discharge status: Home / Self Care | Payer: Medicare Other | Source: Ambulatory Visit | Attending: Pulmonary Disease | Admitting: Pulmonary Disease

## 2016-09-25 DIAGNOSIS — G319 Degenerative disease of nervous system, unspecified: Secondary | ICD-10-CM | POA: Diagnosis not present

## 2016-09-25 DIAGNOSIS — R51 Headache: Secondary | ICD-10-CM | POA: Diagnosis not present

## 2016-09-25 DIAGNOSIS — R519 Headache, unspecified: Secondary | ICD-10-CM

## 2016-10-09 ENCOUNTER — Other Ambulatory Visit (HOSPITAL_COMMUNITY): Payer: Self-pay | Admitting: Pulmonary Disease

## 2016-10-09 ENCOUNTER — Ambulatory Visit (HOSPITAL_COMMUNITY)
Admission: RE | Admit: 2016-10-09 | Discharge: 2016-10-09 | Disposition: A | Discharge status: Home / Self Care | Payer: Medicare Other | Source: Ambulatory Visit | Attending: Pulmonary Disease | Admitting: Pulmonary Disease

## 2016-10-09 DIAGNOSIS — T148XXA Other injury of unspecified body region, initial encounter: Secondary | ICD-10-CM

## 2016-10-09 DIAGNOSIS — S99921A Unspecified injury of right foot, initial encounter: Secondary | ICD-10-CM | POA: Diagnosis not present

## 2016-10-09 DIAGNOSIS — R937 Abnormal findings on diagnostic imaging of other parts of musculoskeletal system: Secondary | ICD-10-CM | POA: Diagnosis not present

## 2016-10-09 DIAGNOSIS — X58XXXA Exposure to other specified factors, initial encounter: Secondary | ICD-10-CM | POA: Diagnosis not present

## 2016-10-09 DIAGNOSIS — M19071 Primary osteoarthritis, right ankle and foot: Secondary | ICD-10-CM | POA: Insufficient documentation

## 2016-10-24 ENCOUNTER — Encounter (INDEPENDENT_AMBULATORY_CARE_PROVIDER_SITE_OTHER): Payer: Self-pay | Admitting: Orthopedic Surgery

## 2016-10-24 ENCOUNTER — Ambulatory Visit (INDEPENDENT_AMBULATORY_CARE_PROVIDER_SITE_OTHER): Payer: Medicare Other

## 2016-10-24 ENCOUNTER — Ambulatory Visit (INDEPENDENT_AMBULATORY_CARE_PROVIDER_SITE_OTHER): Payer: Medicare Other | Admitting: Orthopedic Surgery

## 2016-10-24 DIAGNOSIS — M545 Low back pain, unspecified: Secondary | ICD-10-CM

## 2016-10-24 DIAGNOSIS — M79671 Pain in right foot: Secondary | ICD-10-CM | POA: Diagnosis not present

## 2016-10-24 MED ORDER — METHOCARBAMOL 500 MG PO TABS: 500.0000 mg | ORAL_TABLET | Freq: Three times a day (TID) | ORAL | 0 refills | Status: DC | PRN

## 2016-10-24 MED ORDER — DICLOFENAC SODIUM 1 % TD GEL: 4.0000 g | Freq: Two times a day (BID) | TRANSDERMAL | Status: AC

## 2016-10-24 NOTE — Progress Notes (Signed)
Office Visit Note   Patient: Christina Branch           Date of Birth: [birth year]           MRN: [id number] Visit Date: 10/24/2016 Requested by: Vincente Liberty, MD [address] [place], [place] [address] PCP: Leola Brazil, MD  Subjective: Chief Complaint  Patient presents with  . Right 2nd Toe - Pain  . Lower Back - Pain    HPI Christina Branch is a 60 showed female who injured her right second toe 10/04/2016.  Outside radiographs are reviewed.  Interpretation was no fracture however in the distal part of the middle phalanx there is some obliquity which I think does represent a fracture.  That is consistent with her mechanism of injury as well as her exam.  Patient states that the right second toe still swollen and it hurts a lot.  She is concerned about the circulation to the toe.  Patient also describes Back pain.  Besides her.  She feels like it's more of a muscular problem.  She is putting on her sock and straightened up and had the onset of pain which is a grabbing type pain this occurred 10/17/2016.              Review of Systems All systems reviewed are negative as they relate to the chief complaint within the history of present illness.  Patient denies  fevers or chills.    Assessment & Plan: Visit Diagnoses:  1. Pain in right foot   2. Acute bilateral low back pain without sciatica     Plan: Impression is stable right toe distal phalanx fracture which doesn't really need any treatment or intervention.  The perfusion to the toe is intact.  In regards to the low back she has pretty good x-rays in terms of absence of degenerative changes.  I think this likely is a muscular problem.  I'm not try her on some muscle relaxers and diclofenac gel.  Follow-Up Instructions: No Follow-up on file.   Orders:  Orders Placed This Encounter  Procedures  . XR Lumbar Spine 2-3 Views   No orders of the defined types were placed in this encounter.     Procedures: No procedures  performed   Clinical Data: No additional findings.  Objective: Vital Signs: There were no vitals taken for this visit.  Physical Exam  Constitutional: She appears well-developed.  HENT:  Head: Normocephalic.  Eyes: EOM are normal.  Neck: Normal range of motion.  Cardiovascular: Normal rate.   Pulmonary/Chest: Effort normal.  Neurological: She is alert.  Skin: Skin is warm.  Psychiatric: She has a normal mood and affect.    Ortho Exam examination of the right second toe demonstrates some swelling in a fusiform fashion of the toe.  The toe itself is perfused.  Minimal pain with flexion of the PIP and DIP joint of the toe.  The foot itself is also perfused but pulses are diminished which is consistent with her smoking history.  Ankle range of motion is intact.  There is no significant deformity to the second toe in relation to the other toes  Patient has some pain with rotation in forward and lateral bending but no nerve root tension signs and good ankle dorsi flexion plantar flexion: History strength bilaterally with no paresthesias in the legs.  Specialty Comments:  No specialty comments available.  Imaging: Xr Lumbar Spine 2-3 Views  Result Date: 10/24/2016 AP lateral lumbar spine reviewed.  No  spondylolisthesis is present.  Only mild facet arthritis is visualized on the lateral.  Disc space is maintained in the lumbar spine.  Evidence of prior hardware placement noted in the right hip with subsequent degenerative changes present.  left hip normal    PMFS History: Patient Active Problem List   Diagnosis Date Noted  . Atherosclerosis of native arteries of the extremities with intermittent claudication 08/01/2012  . Pain in limb 07/04/2012  . Peripheral vascular disease, unspecified 07/04/2012  . Intermittent claudication ([place]) 06/29/2011   Past Medical History:  Diagnosis Date  . Claudication ([place])   . DJD (degenerative joint disease) of knee   . GERD (gastroesophageal  reflux disease)   . H/O: varicose veins    Right Leg  . Hyperlipidemia   . Hypertension   . Hypoxemia   . Myalgia and myositis, unspecified   . Pain in limb   . Varicose veins     Family History  Problem Relation Age of Onset  . Cancer Mother     Past Surgical History:  Procedure Laterality Date  . CHOLECYSTECTOMY     Open cholecystectomy  . COLON SURGERY     partial resection due to large polyps (Benign)  . FRACTURE SURGERY  1953   ORIF right hip  . HEMORRHOID SURGERY     Social History   Occupational History  . Not on file.   Social History Main Topics  . Smoking status: Current Every Day Smoker    Packs/day: 0.25    Types: Cigarettes  . Smokeless tobacco: Never Used  . Alcohol use No  . Drug use: No  . Sexual activity: Not on file

## 2016-10-25 ENCOUNTER — Telehealth (INDEPENDENT_AMBULATORY_CARE_PROVIDER_SITE_OTHER): Payer: Self-pay | Admitting: *Deleted

## 2016-10-25 NOTE — Telephone Encounter (Signed)
Pt called stating her RX was not at pharmacy and also had a question about yesterdays visit pt requesting call back.

## 2016-10-25 NOTE — Telephone Encounter (Signed)
Yes please prescribe and send her samples thanks

## 2016-10-25 NOTE — Telephone Encounter (Signed)
Pt still had [facility] in her chart for a pharmacy, I took this out because pt stated this was done yesterday and when I tried to explain that it was still in there and possibly her RX went to that one pt got very angry.

## 2016-10-25 NOTE — Telephone Encounter (Signed)
Patient called and was asking about the pennsaid gel.  Was I supposed to give her samples?

## 2016-10-26 NOTE — Telephone Encounter (Signed)
IC pt LMVM to come pickup samples, cannot call this in for her, she has [employer] and it will not cover it.

## 2017-05-23 ENCOUNTER — Telehealth (INDEPENDENT_AMBULATORY_CARE_PROVIDER_SITE_OTHER): Payer: Self-pay

## 2017-05-23 NOTE — Telephone Encounter (Signed)
Patient called to make an appointment.

## 2017-05-27 ENCOUNTER — Ambulatory Visit (INDEPENDENT_AMBULATORY_CARE_PROVIDER_SITE_OTHER): Payer: Medicare Other | Admitting: Orthopedic Surgery

## 2017-05-27 ENCOUNTER — Encounter (INDEPENDENT_AMBULATORY_CARE_PROVIDER_SITE_OTHER): Payer: Self-pay | Admitting: Orthopedic Surgery

## 2017-05-27 DIAGNOSIS — G8929 Other chronic pain: Secondary | ICD-10-CM | POA: Diagnosis not present

## 2017-05-27 DIAGNOSIS — M25562 Pain in left knee: Secondary | ICD-10-CM

## 2017-05-27 DIAGNOSIS — M25512 Pain in left shoulder: Secondary | ICD-10-CM

## 2017-05-27 MED ORDER — METHYLPREDNISOLONE ACETATE 40 MG/ML IJ SUSP
40.0000 mg | INTRAMUSCULAR | Status: AC | PRN
  Administered 2017-05-27: 40 mg via INTRA_ARTICULAR

## 2017-05-27 MED ORDER — BUPIVACAINE HCL 0.25 % IJ SOLN
4.0000 mL | INTRAMUSCULAR | Status: AC | PRN
Start: 2017-05-27 — End: 2017-05-27
  Administered 2017-05-27: 4 mL via INTRA_ARTICULAR

## 2017-05-27 MED ORDER — BUPIVACAINE HCL 0.5 % IJ SOLN
9.0000 mL | INTRAMUSCULAR | Status: AC | PRN
  Administered 2017-05-27: 9 mL via INTRA_ARTICULAR

## 2017-05-27 MED ORDER — LIDOCAINE HCL 1 % IJ SOLN
5.0000 mL | INTRAMUSCULAR | Status: AC | PRN
  Administered 2017-05-27: 5 mL

## 2017-05-27 NOTE — Progress Notes (Signed)
Office Visit Note   Patient: Christina Branch           Date of Birth: June 05, 1941           MRN: [id number] Visit Date: 05/27/2017 Requested by: Vincente Liberty, MD [id number] [place], [place] [address] PCP: Vincente Liberty, MD  Subjective: Chief Complaint  Patient presents with  . Left Shoulder - Pain  . Left Knee - Pain    HPI: Christina Branch is a 76 year old female with left knee pain and left shoulder pain.  She has a known history of arthritis in both of these areas.  Previous aspiration injection in the knee on the left performed September 2015.  Left shoulder previously injected June 2015.  She states with the left shoulder she's having some waking out of sleep due to pain.  She also reports some pain radiating down the arm.  She is right-hand dominant.  States that the pain in the left knee has gotten worse in the last couple of weeks.              ROS: All systems reviewed are negative as they relate to the chief complaint within the history of present illness.  Patient denies  fevers or chills.   Assessment & Plan: Visit Diagnoses:  1. Chronic pain of left knee   2. Chronic left shoulder pain     Plan: Impression is symptomatic left knee arthritis primarily patellofemoral with mild effusion.  Lantus to aspirate and inject that knee today.  In regard to the left shoulder I think she has some arthritis and early rotator cuff arthropathy but with reasonable the vein pain strength.  We'll inject this area as well today.  I will see her back as needed.  Below shoulder levels strengthening exercises and nonweightbearing quad strengthening exercises have been discussed in the past and she should continue those moving forward  Follow-Up Instructions: Return if symptoms worsen or fail to improve.   Orders:  No orders of the defined types were placed in this encounter.  No orders of the defined types were placed in this encounter.     Procedures: Large Joint Inj Date/Time:  05/27/2017 5:11 PM Performed by: Meredith Pel Authorized by: Meredith Pel   Consent Given by:  Patient Site marked: the procedure site was marked   Timeout: prior to procedure the correct patient, procedure, and site was verified   Indications:  Pain, joint swelling and diagnostic evaluation Location:  Knee Site:  L knee Prep: patient was prepped and draped in usual sterile fashion   Needle Size:  18 G Needle Length:  1.5 inches Approach:  Superolateral Ultrasound Guidance: No   Fluoroscopic Guidance: No   Arthrogram: No   Medications:  5 mL lidocaine 1 %; 4 mL bupivacaine 0.25 %; 40 mg methylPREDNISolone acetate 40 MG/ML Aspiration Attempted: Yes   Aspirate amount (mL):  15 Aspirate:  Yellow Patient tolerance:  Patient tolerated the procedure well with no immediate complications Large Joint Inj Date/Time: 05/27/2017 5:11 PM Performed by: Meredith Pel Authorized by: Meredith Pel   Consent Given by:  Patient Site marked: the procedure site was marked   Timeout: prior to procedure the correct patient, procedure, and site was verified   Indications:  Pain and diagnostic evaluation Location:  Shoulder Site:  L subacromial bursa Prep: patient was prepped and draped in usual sterile fashion   Needle Size:  18 G Needle Length:  1.5 inches Approach:  Posterior Ultrasound  Guidance: No   Fluoroscopic Guidance: No   Arthrogram: No   Medications:  5 mL lidocaine 1 %; 9 mL bupivacaine 0.5 %; 40 mg methylPREDNISolone acetate 40 MG/ML Aspiration Attempted: No   Patient tolerance:  Patient tolerated the procedure well with no immediate complications     Clinical Data: No additional findings.  Objective:  orthopedic exam demonstrates normal gait and alignment trace effusion in the left knee with mild patellofemoral crepitus.  Extensor mechanism is intact.  Pedal pulses palpable despite the fact that she is a smoker.  Collateral and cruciate ligaments are  stable.  Range of motion is full.  Lipoma present 4 x 4 centimeters posterior lateral which is unchanged from previous workup.  No groin pain with internal/external rotation of the leg.  No focal joint line tenderness is present.  Examination of the left shoulder demonstrates reasonable active and passive range of motion with good rotator cuff strength isolated infraspinatus super space and subscap muscle testing.  She does have some coarse grinding and crepitus with passive range of motion of the shoulder.  Negative apprehension relocation testing.  No acromioclavicular joint tenderness to direct palpation.  No other masses lymph adenopathy or skin changes noted in the left shoulder girdle region  Vital Signs: There were no vitals taken for this visit.  Physical Exam:   Constitutional: Patient appears well-developed HEENT:  Head: Normocephalic Eyes:EOM are normal Neck: Normal range of motion Cardiovascular: Normal rate Pulmonary/chest: Effort normal Neurologic: Patient is alert Skin: Skin is warm Psychiatric: Patient has normal mood and affect    Ortho Exam:                   Specialty Comments:  No specialty comments available.  Imaging: No results found.   PMFS History: Patient Active Problem List   Diagnosis Date Noted  . Atherosclerosis of native arteries of the extremities with intermittent claudication 08/01/2012  . Pain in limb 07/04/2012  . Peripheral vascular disease, unspecified ([place]) 07/04/2012  . Intermittent claudication ([place]) 06/29/2011   Past Medical History:  Diagnosis Date  . Claudication ([place])   . DJD (degenerative joint disease) of knee   . GERD (gastroesophageal reflux disease)   . H/O: varicose veins    Right Leg  . Hyperlipidemia   . Hypertension   . Hypoxemia   . Myalgia and myositis, unspecified   . Pain in limb   . Varicose veins     Family History  Problem Relation Age of Onset  . Cancer Mother     Past Surgical  History:  Procedure Laterality Date  . CHOLECYSTECTOMY     Open cholecystectomy  . COLON SURGERY     partial resection due to large polyps (Benign)  . FRACTURE SURGERY  1953   ORIF right hip  . HEMORRHOID SURGERY     Social History   Occupational History  . Not on file.   Social History Main Topics  . Smoking status: Current Every Day Smoker    Packs/day: 0.25    Types: Cigarettes  . Smokeless tobacco: Never Used  . Alcohol use No  . Drug use: No  . Sexual activity: Not on file

## 2018-03-25 ENCOUNTER — Other Ambulatory Visit (HOSPITAL_COMMUNITY): Payer: Self-pay | Admitting: Pulmonary Disease

## 2018-03-25 DIAGNOSIS — R109 Unspecified abdominal pain: Secondary | ICD-10-CM

## 2018-03-31 ENCOUNTER — Encounter (HOSPITAL_COMMUNITY): Payer: Self-pay

## 2018-03-31 ENCOUNTER — Ambulatory Visit (HOSPITAL_COMMUNITY)
Admission: RE | Admit: 2018-03-31 | Discharge: 2018-03-31 | Disposition: A | Discharge status: Home / Self Care | Payer: Medicare Other | Source: Ambulatory Visit | Attending: Pulmonary Disease | Admitting: Pulmonary Disease

## 2018-03-31 DIAGNOSIS — D1771 Benign lipomatous neoplasm of kidney: Secondary | ICD-10-CM | POA: Insufficient documentation

## 2018-03-31 DIAGNOSIS — I700 Atherosclerosis of aorta: Secondary | ICD-10-CM | POA: Diagnosis not present

## 2018-03-31 DIAGNOSIS — Z9049 Acquired absence of other specified parts of digestive tract: Secondary | ICD-10-CM | POA: Insufficient documentation

## 2018-03-31 DIAGNOSIS — E278 Other specified disorders of adrenal gland: Secondary | ICD-10-CM | POA: Diagnosis not present

## 2018-03-31 DIAGNOSIS — K469 Unspecified abdominal hernia without obstruction or gangrene: Secondary | ICD-10-CM | POA: Diagnosis not present

## 2018-03-31 DIAGNOSIS — M167 Other unilateral secondary osteoarthritis of hip: Secondary | ICD-10-CM | POA: Insufficient documentation

## 2018-03-31 DIAGNOSIS — K5730 Diverticulosis of large intestine without perforation or abscess without bleeding: Secondary | ICD-10-CM | POA: Diagnosis not present

## 2018-03-31 DIAGNOSIS — R109 Unspecified abdominal pain: Secondary | ICD-10-CM

## 2018-03-31 MED ORDER — IOHEXOL 300 MG/ML  SOLN
100.0000 mL | Freq: Once | INTRAMUSCULAR | Status: AC | PRN
  Administered 2018-03-31: 100 mL via INTRAVENOUS

## 2018-08-27 ENCOUNTER — Encounter (INDEPENDENT_AMBULATORY_CARE_PROVIDER_SITE_OTHER): Payer: Self-pay | Admitting: Orthopedic Surgery

## 2018-08-27 ENCOUNTER — Ambulatory Visit (INDEPENDENT_AMBULATORY_CARE_PROVIDER_SITE_OTHER): Payer: Self-pay

## 2018-08-27 ENCOUNTER — Ambulatory Visit (INDEPENDENT_AMBULATORY_CARE_PROVIDER_SITE_OTHER): Payer: Medicare Other | Admitting: Orthopedic Surgery

## 2018-08-27 DIAGNOSIS — M79642 Pain in left hand: Secondary | ICD-10-CM | POA: Diagnosis not present

## 2018-08-27 DIAGNOSIS — G8929 Other chronic pain: Secondary | ICD-10-CM

## 2018-08-27 DIAGNOSIS — M25511 Pain in right shoulder: Secondary | ICD-10-CM | POA: Diagnosis not present

## 2018-08-27 DIAGNOSIS — M25562 Pain in left knee: Secondary | ICD-10-CM

## 2018-08-27 MED ORDER — IBUPROFEN 800 MG PO TABS: ORAL_TABLET | ORAL | 0 refills | Status: DC

## 2018-08-27 NOTE — Progress Notes (Signed)
Office Visit Note   Patient: Christina Branch           Date of Birth: 03-16-1941           MRN: [id number] Visit Date: 08/27/2018 Requested by: Vincente Liberty, MD [address] [place], [place] [address] PCP: Vincente Liberty, MD  Subjective: Chief Complaint  Patient presents with  . Right Shoulder - Pain  . Left Hand - Pain  . Left Knee - Pain    HPI: Patient presents with multiple joint complaints including the right shoulder left thumb and left knee.  She describes several months of pain in that right arm and shoulder with some radiation into the neck as well as arm pain.  She denies any numbness and tingling.  She is right-hand dominant.  Reports some mechanical symptoms in the shoulder.  Patient also describes left thumb pain but only with certain movements and no history of injury.  This is been going on for several months as well.  Does report pain with pinching.  She is right-hand dominant.  Patient also describes left knee pain and swelling which is consistent with her known history of arthritis.  Denies any interval history of injury or mechanical symptoms in that left knee.              ROS: All systems reviewed are negative as they relate to the chief complaint within the history of present illness.  Patient denies  fevers or chills.   Assessment & Plan: Visit Diagnoses:  1. Chronic pain of left knee   2. Pain in left hand   3. Chronic right shoulder pain     Plan: Impression is end-stage arthritis left knee with effusion.  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  Plan at this time is ibuprofen to be taken once daily.  We discussed injection into the thumb shoulder and knee as the next step of intervention.  She wants to hold off on that intervention for now.  She had been taking hydrocodone but she is been out of that pain medicine for about  4 weeks.  Her physician has been out due to sickness.  I will see her back as needed.  Follow-Up Instructions: Return if symptoms worsen or fail to improve.   Orders:  Orders Placed This Encounter  Procedures  . XR KNEE 3 VIEW LEFT  . XR Hand Complete Left  . XR Shoulder Right   Meds ordered this encounter  Medications  . ibuprofen (ADVIL,MOTRIN) 800 MG tablet    Sig: 1 po q d prn pain    Dispense:  60 tablet    Refill:  0      Procedures: No procedures performed   Clinical Data: No additional findings.  Objective: Vital Signs: There were no vitals taken for this visit.  Physical Exam:   Constitutional: Patient appears well-developed HEENT:  Head: Normocephalic Eyes:EOM are normal Neck: Normal range of motion Cardiovascular: Normal rate Pulmonary/chest: Effort normal Neurologic: Patient is alert Skin: Skin is warm Psychiatric: Patient has normal mood and affect    Ortho Exam: Ortho exam of the right shoulder demonstrates forward flexion and abduction both above 90 degrees.  She does have some coarseness and grinding with passive range of motion of that right shoulder but good deltoid function.  Cuff strength is slightly weak to infraspinatus supraspinatus testing on the right compared  to the left.  She does have pretty good passive range of motion and good motor or sensory function to the hand.  No masses lymph adenopathy or skin changes noted in that shoulder girdle region.  In regards to the left thumb the patient has positive grind test but intact EPL FPL function.  Grip strength is intact and radial pulses intact.  No other masses lymphadenopathy or skin changes noted in that left hand and thumb region.  In regards to the left knee the patient has effusion but good range of motion intact extensor mechanism.  Pedal pulses trace palpable.  Collateral crucial ligaments are stable.  Specialty Comments:  No specialty comments available.  Imaging: Xr Hand Complete  Left  Result Date: 08/27/2018 AP lateral oblique left hand reviewed.  CMC arthritis is present at the base of the thumb.  No significant fracture dislocation or other degenerative changes noted in the carpals or metacarpal phalangeal articulations.  Xr Knee 3 View Left  Result Date: 08/27/2018 AP lateral left knee reviewed.  End-stage tricompartmental arthritis with slight varus alignment present.  Spurring is noted in all 3 compartments.  No fracture or dislocation present.  Xr Shoulder Right  Result Date: 08/27/2018 AP outlet axillary right shoulder reviewed.  Mild osteo-pia is present.  Enthesopathic changes at the rotator cuff attachment site on the greater tuberosity are present.  There is no fracture or dislocation present.  No glenohumeral arthritis and only mild AC joint arthritis noted    PMFS History: Patient Active Problem List   Diagnosis Date Noted  . Atherosclerosis of native arteries of the extremities with intermittent claudication 08/01/2012  . Pain in limb 07/04/2012  . Peripheral vascular disease, unspecified ([place]) 07/04/2012  . Intermittent claudication ([place]) 06/29/2011   Past Medical History:  Diagnosis Date  . Claudication ([place])   . DJD (degenerative joint disease) of knee   . GERD (gastroesophageal reflux disease)   . H/O: varicose veins    Right Leg  . Hyperlipidemia   . Hypertension   . Hypoxemia   . Myalgia and myositis, unspecified   . Pain in limb   . Varicose veins     Family History  Problem Relation Age of Onset  . Cancer Mother     Past Surgical History:  Procedure Laterality Date  . CHOLECYSTECTOMY     Open cholecystectomy  . COLON SURGERY     partial resection due to large polyps (Benign)  . FRACTURE SURGERY  1953   ORIF right hip  . HEMORRHOID SURGERY     Social History   Occupational History  . Not on file  Tobacco Use  . Smoking status: Current Every Day Smoker    Packs/day: 0.25    Types: Cigarettes  . Smokeless tobacco:  Never Used  Substance and Sexual Activity  . Alcohol use: No  . Drug use: No  . Sexual activity: Not on file

## 2020-01-29 ENCOUNTER — Ambulatory Visit: Payer: Medicare Other | Attending: Internal Medicine

## 2020-01-29 DIAGNOSIS — Z23 Encounter for immunization: Secondary | ICD-10-CM | POA: Insufficient documentation

## 2020-01-29 NOTE — Progress Notes (Signed)
   Covid-19 Vaccination Clinic  Name:  Christina Branch    MRN: [id number] DOB: 1941-01-25  01/29/2020  [name] was observed post Covid-19 immunization for 15 minutes without incident. She was provided with Vaccine Information Sheet and instruction to access the V-Safe system.   [name] was instructed to call 911 with any severe reactions post vaccine: Marland Kitchen Difficulty breathing  . Swelling of face and throat  . A fast heartbeat  . A bad rash all over body  . Dizziness and weakness   Immunizations Administered    Name Date Dose VIS Date Route   Pfizer COVID-19 Vaccine 01/29/2020  2:40 PM 0.3 mL 11/06/2019 Intramuscular   Manufacturer: [place]   Lot: [id number]   [place]: [id number]

## 2020-02-01 NOTE — Progress Notes (Signed)
Office Visit Note  Patient: Christina Branch             Date of Birth: 12-26-1940           MRN: [id number]             PCP: Vincente Liberty, MD Referring: Vincente Liberty, MD Visit Date: 02/02/2020 Occupation: @GUAROCC @  Subjective:  Positive ANA.   History of Present Illness: Christina Branch is a 79 y.o. female seen in consultation per request of her PCP for evaluation of positive ANA.  According to patient she has had arthritis in her joints for many years.  She has been under care of Dr. Marlou Sa  He is diagnosed her with rotator cuff tendinopathy.  He is also done knee x-rays and aspirated her knee joint for effusion.  She states she has some discomfort in her left CMC joint but does not notice any swelling in her hands.  She has occasional swelling in her left ankle joint without discomfort.  She has been experiencing numbness off and on in her hands and her feet.  She was recently evaluated by her PCP and her labs showed positive ANA.  Activities of Daily Living:  Patient reports morning stiffness for several hours.   Patient Reports nocturnal pain.  Difficulty dressing/grooming: Denies Difficulty climbing stairs: Denies Difficulty getting out of chair: Denies Difficulty using hands for taps, buttons, cutlery, and/or writing: Reports  Review of Systems  Constitutional: Negative for fatigue, night sweats, weight gain and weight loss.  HENT: Negative for mouth sores, trouble swallowing, trouble swallowing, mouth dryness and nose dryness.   Eyes: Negative for pain, redness, itching, visual disturbance and dryness.  Respiratory: Negative for cough, shortness of breath and difficulty breathing.   Cardiovascular: Negative for chest pain, palpitations, hypertension, irregular heartbeat and swelling in legs/feet.  Gastrointestinal: Negative for blood in stool, constipation and diarrhea.  Endocrine: Negative for increased urination.  Genitourinary: Negative for difficulty urinating,  painful urination and vaginal dryness.  Musculoskeletal: Positive for arthralgias, joint pain, joint swelling, morning stiffness and muscle tenderness. Negative for myalgias, muscle weakness and myalgias.  Skin: Negative for color change, rash, hair loss, skin tightness, ulcers and sensitivity to sunlight.  Allergic/Immunologic: Negative for susceptible to infections.  Neurological: Positive for numbness. Negative for dizziness, headaches, memory loss, night sweats and weakness.  Hematological: Negative for bruising/bleeding tendency and swollen glands.  Psychiatric/Behavioral: Negative for depressed mood, confusion and sleep disturbance. The patient is not nervous/anxious.     PMFS History:  Patient Active Problem List   Diagnosis Date Noted  . Primary osteoarthritis of both knees 02/02/2020  . Primary osteoarthritis of both hands 02/02/2020  . Arthropathy of lumbar facet joint 02/02/2020  . 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 DDD (degenerative disc disease), cervical 02/02/2020  . Atherosclerosis of native artery of extremity with intermittent claudication ([place]) 08/01/2012  . Pain in limb 07/04/2012  . Peripheral vascular disease, unspecified ([place]) 07/04/2012  . Intermittent claudication ([place]) 06/29/2011    Past Medical History:  Diagnosis Date  . Claudication ([place])   . DJD (degenerative joint disease) of knee   . GERD (gastroesophageal reflux disease)   . H/O: varicose veins    Right Leg  . Hyperlipidemia   . Hypertension   . Hypoxemia   . Myalgia and myositis, unspecified   . Pain in limb   . Varicose veins     Family History  Problem Relation Age of Onset  .  Cancer Mother   . Breast cancer Sister   . Hypertension Son    Past Surgical History:  Procedure Laterality Date  . CHOLECYSTECTOMY     Open cholecystectomy  . COLON SURGERY     partial resection due to large polyps (Benign)  . FRACTURE SURGERY  1953   ORIF right  hip  . HEMORRHOID SURGERY     Social History   Social History Narrative  . Not on file   Immunization History  Administered Date(s) Administered  . PFIZER SARS-COV-2 Vaccination 01/29/2020     Objective: Vital Signs: BP (!) 162/86 (BP Location: Right Arm, Patient Position: Sitting, Cuff Size: Normal)   Pulse 61   Resp 16   Ht 5\' 10"  (1.778 m)   Wt 180 lb (81.6 kg)   BMI 25.83 kg/m    Physical Exam Vitals and nursing note reviewed.  Constitutional:      Appearance: She is well-developed.  HENT:     Head: Normocephalic and atraumatic.  Eyes:     Conjunctiva/sclera: Conjunctivae normal.  Cardiovascular:     Rate and Rhythm: Normal rate and regular rhythm.     Heart sounds: Normal heart sounds.  Pulmonary:     Effort: Pulmonary effort is normal.     Breath sounds: Normal breath sounds.  Abdominal:     General: Bowel sounds are normal.     Palpations: Abdomen is soft.  Musculoskeletal:     Cervical back: Normal range of motion.  Lymphadenopathy:     Cervical: No cervical adenopathy.  Skin:    General: Skin is warm and dry.     Capillary Refill: Capillary refill takes less than 2 seconds.  Neurological:     Mental Status: She is alert and oriented to person, place, and time.  Psychiatric:        Behavior: Behavior normal.      Musculoskeletal Exam: Patient had good range of motion of cervical lumbar spine.  She had right shoulder joint abduction was limited to 90 degrees.  Left shoulder joint was in good range of motion.  Elbow joints and wrist joints in good range of motion.  She has bilateral CMC arthritis and first MCP arthropathy.  None of the other MCP or PIP or DIP changes were noted.  Right hip joint has limited range of motion.  Her left hip joint was in full range of motion.  She has bilateral knee joint effusions and warmth on palpation.  Ankle joints with good range of motion.  She is some tenderness across MTPs.  No synovitis was noted.  CDAI Exam: CDAI  Score: -- Patient Global: --; Provider Global: -- Swollen: --; Tender: -- Joint Exam 02/02/2020   No joint exam has been documented for this visit   There is currently no information documented on the homunculus. Go to the Rheumatology activity and complete the homunculus joint exam.  Investigation: No additional findings.  Imaging: No results found.  Recent Labs: Lab Results  Component Value Date   WBC 8.1 06/17/2014   HGB 17.1 (H) 06/17/2014   PLT 261 06/17/2014   NA 140 06/17/2014   K 3.3 (L) 06/17/2014   CL 103 06/17/2014   CO2 22 06/17/2014   GLUCOSE 91 06/17/2014   BUN 14 06/17/2014   CREATININE 0.58 06/17/2014   CALCIUM 10.5 06/17/2014   GFRAA >90 06/17/2014    Speciality Comments: No specialty comments available.  Procedures:  No procedures performed Allergies: Penicillins, Shellfish allergy, Sulfa antibiotics, and Blain Pais  allergy]   Assessment / Plan:     Visit Diagnoses: Positive ANA (antinuclear antibody) - 09/30/19: ANA 1:40 NS, RF<14, uric acid 6.0, vitamin D 385/20/20: ANA 1:320 NS, 1:80 cytoplasmic, vitamin D 68 -patient had recent labs by her PCP.  Her ANA is positive.  He does not have any clinical features of autoimmune disease.  She does have mild sicca symptoms.  That may be related to the use of narcotics.  We will obtain additional labs today.  Plan: RNP Antibody, Anti-Smith antibody, Sjogrens syndrome-A extractable nuclear antibody, Sjogrens syndrome-B extractable nuclear antibody, Anti-DNA antibody, double-stranded, C3 and C4, Anti-scleroderma antibody  Myalgia-she reports myalgias mostly related to underlying arthritis.  Primary osteoarthritis of both hands-she has osteoarthritic changes in her hands mostly in her CMC joints.  No synovitis was noted.  Primary osteoarthritis of both knees-she has severe osteoarthritis in bilateral knee joints.  Patient states she does not want to get total knee replacement.  She has difficulty walking due to  arthritis.  She has been under care of Dr. Marlou Sa and had several knee joint aspirations in the past.  She has warmth swelling and effusion in bilateral knee joints today.  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  I believe the recurrent effusions most likely are related to pseudogout.  I will obtain additional labs.  She is on ibuprofen which should be controlling her symptoms.  Plan: Iron, TIBC and Ferritin Panel, Magnesium  Pain in both feet -she complains of pain and discomfort in her bilateral feet.  Will obtain x-rays and labs today.  Plan: XR Foot 2 Views Right, XR Foot 2 Views Left, x-rays reveal only osteoarthritic changes.  Cyclic citrul peptide antibody, IgG, 14-3-3 eta Protein, Uric acid  DDD (degenerative disc disease), cervical-I reviewed her previous cervical spine x-rays which were consistent with degenerative disc disease.  Arthropathy of lumbar facet joint-I reviewed x-rays of her lumbar spine which were consistent with facet joint arthropathy.  Paresthesias -she complains of intermittent paresthesias in her bilateral hands and her bilateral feet.  Plan: Vitamin B12, Folate.  I will also refer to neurology for nerve conduction velocities and EMGs.  Age-related osteoporosis without current pathological fracture-I do not have a DEXA scan to review.  Have advised her to bring it next visit with her.    Idiopathic chronic gout without tophus, unspecified site -patient denies any history of gout.  She is not taking any medications currently.  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  Other medical problems are listed as follows:  Asymptomatic varicose veins of bilateral lower extremities  Intermittent claudication (HCC)  History of COPD  Hypertensive heart  disease without heart failure  History of hyperlipidemia  Diverticulosis of colon  History of gastroesophageal reflux (GERD)  Other atopic dermatitis  Tobacco use  Orders: Orders Placed This Encounter  Procedures  . XR Foot 2 Views Right  . XR Foot 2 Views Left  . Cyclic citrul peptide antibody, IgG  . 14-3-3 eta Protein  . Uric acid  . RNP Antibody  . Anti-Smith antibody  . Sjogrens syndrome-A extractable nuclear antibody  . Sjogrens syndrome-B extractable nuclear antibody  . Anti-DNA antibody, double-stranded  . C3 and C4  . Anti-scleroderma antibody  . Vitamin B12  . Folate  . Iron, TIBC and Ferritin Panel  . Magnesium  No orders of the defined types were placed in this encounter.   Face-to-face time spent with patient was 50 minutes. Greater than 50% of time was spent in counseling and coordination of care.  Follow-Up Instructions: Return for Polyarthralgia, positive ANA, CPPD, OA.   Bo Merino, MD  Note - This record has been created using Editor, commissioning.  Chart creation errors have been sought, but may not always  have been located. Such creation errors do not reflect on  the standard of medical care.

## 2020-02-02 ENCOUNTER — Ambulatory Visit: Payer: Self-pay

## 2020-02-02 ENCOUNTER — Other Ambulatory Visit: Payer: Self-pay

## 2020-02-02 ENCOUNTER — Encounter: Payer: Self-pay | Admitting: Rheumatology

## 2020-02-02 ENCOUNTER — Ambulatory Visit: Payer: Medicare Other | Admitting: Rheumatology

## 2020-02-02 ENCOUNTER — Other Ambulatory Visit: Payer: Self-pay | Admitting: *Deleted

## 2020-02-02 VITALS — BP 162/86 | HR 61 | Resp 16 | Ht 70.0 in | Wt 180.0 lb

## 2020-02-02 DIAGNOSIS — M11269 Other chondrocalcinosis, unspecified knee: Secondary | ICD-10-CM | POA: Insufficient documentation

## 2020-02-02 DIAGNOSIS — Z720 Tobacco use: Secondary | ICD-10-CM

## 2020-02-02 DIAGNOSIS — Z8719 Personal history of other diseases of the digestive system: Secondary | ICD-10-CM

## 2020-02-02 DIAGNOSIS — M19041 Primary osteoarthritis, right hand: Secondary | ICD-10-CM | POA: Diagnosis not present

## 2020-02-02 DIAGNOSIS — R768 Other specified abnormal immunological findings in serum: Secondary | ICD-10-CM

## 2020-02-02 DIAGNOSIS — M19042 Primary osteoarthritis, left hand: Secondary | ICD-10-CM

## 2020-02-02 DIAGNOSIS — M79672 Pain in left foot: Secondary | ICD-10-CM | POA: Diagnosis not present

## 2020-02-02 DIAGNOSIS — M791 Myalgia, unspecified site: Secondary | ICD-10-CM | POA: Diagnosis not present

## 2020-02-02 DIAGNOSIS — M170 Bilateral primary osteoarthritis of knee: Secondary | ICD-10-CM | POA: Insufficient documentation

## 2020-02-02 DIAGNOSIS — I8393 Asymptomatic varicose veins of bilateral lower extremities: Secondary | ICD-10-CM

## 2020-02-02 DIAGNOSIS — M79671 Pain in right foot: Secondary | ICD-10-CM | POA: Diagnosis not present

## 2020-02-02 DIAGNOSIS — K5730 Diverticulosis of large intestine without perforation or abscess without bleeding: Secondary | ICD-10-CM

## 2020-02-02 DIAGNOSIS — Z8709 Personal history of other diseases of the respiratory system: Secondary | ICD-10-CM

## 2020-02-02 DIAGNOSIS — M5030 Other cervical disc degeneration, unspecified cervical region: Secondary | ICD-10-CM

## 2020-02-02 DIAGNOSIS — M1A00X0 Idiopathic chronic gout, unspecified site, without tophus (tophi): Secondary | ICD-10-CM

## 2020-02-02 DIAGNOSIS — I739 Peripheral vascular disease, unspecified: Secondary | ICD-10-CM

## 2020-02-02 DIAGNOSIS — I119 Hypertensive heart disease without heart failure: Secondary | ICD-10-CM

## 2020-02-02 DIAGNOSIS — M47816 Spondylosis without myelopathy or radiculopathy, lumbar region: Secondary | ICD-10-CM

## 2020-02-02 DIAGNOSIS — L2089 Other atopic dermatitis: Secondary | ICD-10-CM

## 2020-02-02 DIAGNOSIS — R202 Paresthesia of skin: Secondary | ICD-10-CM

## 2020-02-02 DIAGNOSIS — Z8639 Personal history of other endocrine, nutritional and metabolic disease: Secondary | ICD-10-CM

## 2020-02-02 DIAGNOSIS — Z79891 Long term (current) use of opiate analgesic: Secondary | ICD-10-CM

## 2020-02-02 DIAGNOSIS — M810 Age-related osteoporosis without current pathological fracture: Secondary | ICD-10-CM

## 2020-02-10 LAB — FOLATE: Folate: >24 ng/mL

## 2020-02-10 LAB — C3 AND C4
C3 Complement: 132 mg/dL (ref 83–193)
C4 Complement: 33 mg/dL (ref 15–57)

## 2020-02-10 LAB — VITAMIN B12: Vitamin B-12: 893 pg/mL (ref 200–1100)

## 2020-02-10 LAB — ANTI-SCLERODERMA ANTIBODY: Scleroderma (Scl-70) (ENA) Antibody, IgG: <1 AI

## 2020-02-10 LAB — ANTI-DNA ANTIBODY, DOUBLE-STRANDED: ds DNA Ab: 1 IU/mL

## 2020-02-10 LAB — URIC ACID: Uric Acid, Serum: 6.2 mg/dL (ref 2.5–7.0)

## 2020-02-10 LAB — ANTI-SMITH ANTIBODY: ENA SM Ab Ser-aCnc: <1 AI

## 2020-02-10 LAB — SJOGRENS SYNDROME-A EXTRACTABLE NUCLEAR ANTIBODY: SSA (Ro) (ENA) Antibody, IgG: <1 AI

## 2020-02-10 LAB — 14-3-3 ETA PROTEIN: 14-3-3 eta Protein: <0.2 ng/mL (ref <0.2)

## 2020-02-10 LAB — MAGNESIUM: Magnesium: 2 mg/dL (ref 1.5–2.5)

## 2020-02-10 LAB — SJOGRENS SYNDROME-B EXTRACTABLE NUCLEAR ANTIBODY: SSB (La) (ENA) Antibody, IgG: <1 AI

## 2020-02-10 LAB — CYCLIC CITRUL PEPTIDE ANTIBODY, IGG: Cyclic Citrullin Peptide Ab: <16 UNITS

## 2020-02-10 LAB — RNP ANTIBODY: Ribonucleic Protein(ENA) Antibody, IgG: <1 AI

## 2020-02-10 NOTE — Progress Notes (Signed)
I will discuss results at the follow-up visit.

## 2020-02-24 ENCOUNTER — Ambulatory Visit: Payer: Medicare Other | Attending: Internal Medicine

## 2020-02-24 DIAGNOSIS — Z23 Encounter for immunization: Secondary | ICD-10-CM

## 2020-02-24 NOTE — Progress Notes (Signed)
   Covid-19 Vaccination Clinic  Name:  Christina Branch    MRN: [id number] DOB: 11-13-1941  02/24/2020  Christina Branch was observed post Covid-19 immunization for 30 minutes based on pre-vaccination screening without incident. She was provided with Vaccine Information Sheet and instruction to access the V-Safe system.   Christina Branch was instructed to call 911 with any severe reactions post vaccine: Marland Kitchen Difficulty breathing  . Swelling of face and throat  . A fast heartbeat  . A bad rash all over body  . Dizziness and weakness   Immunizations Administered    Name Date Dose VIS Date Route   Pfizer COVID-19 Vaccine 02/24/2020  2:34 PM 0.3 mL 11/06/2019 Intramuscular   Manufacturer: [place]   Lot: [id number]   [place]: [id number]

## 2020-02-29 NOTE — Progress Notes (Signed)
Office Visit Note  Patient: Christina Branch             Date of Birth: 06/01/1941           MRN: [id number]             PCP: Vincente Liberty, MD Referring: Vincente Liberty, MD Visit Date: 03/01/2020 Occupation: @GUAROCC @  Subjective:  Positive ANA and joint pain   History of Present Illness: Christina Branch is a 79 y.o. female with history of positive ANA and osteoarthritis.  She states she continues to have some discomfort in her hands, hip joints and knee joints.  She denies any joint swelling.  She has difficulty walking and climbing stairs.  There is no history of oral ulcers, nasal ulcers, malar rash, photosensitivity, Raynaud's phenomenon.  Activities of Daily Living:  Patient reports morning stiffness for 3 hours.   Patient Reports nocturnal pain.  Difficulty dressing/grooming: Denies Difficulty climbing stairs: Reports Difficulty getting out of chair: Denies Difficulty using hands for taps, buttons, cutlery, and/or writing: Denies  Review of Systems  Constitutional: Negative for fatigue, night sweats, weight gain and weight loss.  HENT: Negative for mouth sores, trouble swallowing, trouble swallowing, mouth dryness and nose dryness.   Eyes: Negative for pain, redness, visual disturbance and dryness.  Respiratory: Negative for cough, shortness of breath and difficulty breathing.   Cardiovascular: Negative for chest pain, palpitations, hypertension, irregular heartbeat and swelling in legs/feet.  Gastrointestinal: Negative for blood in stool, constipation and diarrhea.  Endocrine: Negative for excessive thirst and increased urination.  Genitourinary: Positive for involuntary urination. Negative for vaginal dryness.  Musculoskeletal: Positive for arthralgias, joint pain, morning stiffness and muscle tenderness. Negative for joint swelling, myalgias, muscle weakness and myalgias.  Skin: Negative for color change, rash, hair loss, skin tightness, ulcers and sensitivity to  sunlight.  Allergic/Immunologic: Negative for susceptible to infections.  Neurological: Positive for numbness. Negative for dizziness, memory loss, night sweats and weakness.  Hematological: Positive for bruising/bleeding tendency. Negative for swollen glands.  Psychiatric/Behavioral: Positive for sleep disturbance. Negative for depressed mood. The patient is not nervous/anxious.     PMFS History:  Patient Active Problem List   Diagnosis Date Noted  . Primary osteoarthritis of both knees 02/02/2020  . Primary osteoarthritis of both hands 02/02/2020  . Arthropathy of lumbar facet joint 02/02/2020  . 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 DDD (degenerative disc disease), cervical 02/02/2020  . Atherosclerosis of native artery of extremity with intermittent claudication ([place]) 08/01/2012  . Pain in limb 07/04/2012  . Peripheral vascular disease, unspecified ([place]) 07/04/2012  . Intermittent claudication ([place]) 06/29/2011    Past Medical History:  Diagnosis Date  . Claudication ([place])   . DJD (degenerative joint disease) of knee   . GERD (gastroesophageal reflux disease)   . H/O: varicose veins    Right Leg  . Hyperlipidemia   . Hypertension   . Hypoxemia   . Myalgia and myositis, unspecified   . Pain in limb   . Varicose veins     Family History  Problem Relation Age of Onset  . Cancer Mother   . Breast cancer Sister   . Hypertension Son    Past Surgical History:  Procedure Laterality Date  . CHOLECYSTECTOMY     Open cholecystectomy  . COLON SURGERY     partial resection due to large polyps (Benign)  . FRACTURE SURGERY  1953   ORIF right hip  . HEMORRHOID SURGERY  Social History   Social History Narrative  . Not on file   Immunization History  Administered Date(s) Administered  . PFIZER SARS-COV-2 Vaccination 01/29/2020, 02/24/2020     Objective: Vital Signs: BP (!) 162/80 (BP Location: Left Arm, Patient Position:  Sitting, Cuff Size: Normal)   Pulse 62   Resp 16   Ht 5' 10.5" (1.791 m)   Wt 186 lb 9.6 oz (84.6 kg)   BMI 26.40 kg/m    Physical Exam Vitals and nursing note reviewed.  Constitutional:      Appearance: She is well-developed.  HENT:     Head: Normocephalic and atraumatic.  Eyes:     Conjunctiva/sclera: Conjunctivae normal.  Cardiovascular:     Rate and Rhythm: Normal rate and regular rhythm.     Heart sounds: Normal heart sounds.  Pulmonary:     Effort: Pulmonary effort is normal.     Breath sounds: Normal breath sounds.  Abdominal:     General: Bowel sounds are normal.     Palpations: Abdomen is soft.  Musculoskeletal:     Cervical back: Normal range of motion.  Lymphadenopathy:     Cervical: No cervical adenopathy.  Skin:    General: Skin is warm and dry.     Capillary Refill: Capillary refill takes less than 2 seconds.  Neurological:     Mental Status: She is alert and oriented to person, place, and time.  Psychiatric:        Behavior: Behavior normal.      Musculoskeletal Exam: C-spine was in good range of motion.  She had good range of motion of shoulder joints and elbow joints.  She had mild DIP thickening but no synovitis was noted.  She has limited range of motion of her bilateral hip joints especially the right hip.  She had limited extension of her bilateral knee joints but no warmth swelling or effusion was noted.  Ankle joints with good range of motion.  She had no tenderness over MTPs.  CDAI Exam: CDAI Score: -- Patient Global: --; Provider Global: -- Swollen: --; Tender: -- Joint Exam 03/01/2020   No joint exam has been documented for this visit   There is currently no information documented on the homunculus. Go to the Rheumatology activity and complete the homunculus joint exam.  Investigation: No additional findings.  Imaging: XR Foot 2 Views Left  Result Date: 02/02/2020 First MTP narrowing and valgus deformity was noted.  PIP and DIP  narrowing was noted.  Dorsal spurring was noted.  Small calcaneal spur was noted.  No erosive changes were noted. Impression: These findings are consistent with osteoarthritis of the foot.  XR Foot 2 Views Right  Result Date: 02/02/2020 Mild PIP and DIP narrowing was noted.  First MTP narrowing was noted.  No intertarsal tibiotalar joint space narrowing was noted.  A small inferior and posterior calcaneal spurs were noted. Impression: These findings are consistent with osteoarthritis of the foot.   Recent Labs: Lab Results  Component Value Date   WBC 8.1 06/17/2014   HGB 17.1 (H) 06/17/2014   PLT 261 06/17/2014   NA 140 06/17/2014   K 3.3 (L) 06/17/2014   CL 103 06/17/2014   CO2 22 06/17/2014   GLUCOSE 91 06/17/2014   BUN 14 06/17/2014   CREATININE 0.58 06/17/2014   CALCIUM 10.5 06/17/2014   GFRAA >90 06/17/2014   February 02, 2020 ENA negative, C3-C4 normal, anti-CCP negative, 14 3 3  eta negative, uric acid 6.2, magnesium normal, B12 normal,  folate normal  09/30/19: ANA 1:40 NS, RF<14, uric acid 6.0, vitamin D 38  04/15/19: ANA 1:320 NS, 1:80 cytoplasmic, vitamin D 68  Speciality Comments: No specialty comments available.  Procedures:  No procedures performed Allergies: Penicillins, Shellfish allergy, Sulfa antibiotics, and Tuna [fish allergy]   Assessment / Plan:     Visit Diagnoses: Positive ANA (antinuclear antibody) - ANA low titer.  ANA and complements normal.  All the lab values were discussed at length.  Rheumatoid factor and anti-CCP were negative.  Uric acid was normal.  Patient had no clinical features of autoimmune disease.  Primary osteoarthritis of both hands-she has mild osteoarthritis in her DIPs.  She has some stiffness but no significant pain.  Primary osteoarthritis of both knees - Patient has severe osteoarthritis of bilateral knee joints.  She is followed by Dr. Marlou Sa.  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  I discussed the possibility of colchicine use on as needed basis or regular basis if it becomes an issue.  She can also had a as needed cortisone injection by Dr. Marlou Sa.  Primary osteoarthritis of both feet-proper fitting shoes were discussed.  DDD (degenerative disc disease), cervical-she has some stiffness in her cervical spine.  Arthropathy of lumbar facet joint-she is currently not having much discomfort.  Paresthesias-in upper and lower extremities.  B12 and folate levels normal.  She was referred to neurologist.  Age-related osteoporosis without current pathological fracture-I do not have a DEXA available.  Patient states that she has been getting it through her PCP.  Have advised her to discuss treatment for osteoporosis with her PCP.  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    Follow-Up Instructions: Return if symptoms worsen or fail to improve, for Osteoarthritis.   Bo Merino, MD  Note - This record has been created using Editor, commissioning.  Chart creation errors have been sought, but may not always  have been located. Such creation errors do not reflect on  the standard of medical care.

## 2020-03-01 ENCOUNTER — Ambulatory Visit: Payer: Medicare Other | Admitting: Rheumatology

## 2020-03-01 ENCOUNTER — Encounter: Payer: Self-pay | Admitting: Rheumatology

## 2020-03-01 ENCOUNTER — Other Ambulatory Visit: Payer: Self-pay

## 2020-03-01 VITALS — BP 162/80 | HR 62 | Resp 16 | Ht 70.5 in | Wt 186.6 lb

## 2020-03-01 DIAGNOSIS — R202 Paresthesia of skin: Secondary | ICD-10-CM

## 2020-03-01 DIAGNOSIS — M19071 Primary osteoarthritis, right ankle and foot: Secondary | ICD-10-CM

## 2020-03-01 DIAGNOSIS — I8393 Asymptomatic varicose veins of bilateral lower extremities: Secondary | ICD-10-CM

## 2020-03-01 DIAGNOSIS — M170 Bilateral primary osteoarthritis of knee: Secondary | ICD-10-CM

## 2020-03-01 DIAGNOSIS — M19072 Primary osteoarthritis, left ankle and foot: Secondary | ICD-10-CM

## 2020-03-01 DIAGNOSIS — M19041 Primary osteoarthritis, right hand: Secondary | ICD-10-CM | POA: Diagnosis not present

## 2020-03-01 DIAGNOSIS — I739 Peripheral vascular disease, unspecified: Secondary | ICD-10-CM

## 2020-03-01 DIAGNOSIS — M11269 Other chondrocalcinosis, unspecified knee: Secondary | ICD-10-CM

## 2020-03-01 DIAGNOSIS — R7689 Other specified abnormal immunological findings in serum: Secondary | ICD-10-CM

## 2020-03-01 DIAGNOSIS — Z8709 Personal history of other diseases of the respiratory system: Secondary | ICD-10-CM

## 2020-03-01 DIAGNOSIS — Z8719 Personal history of other diseases of the digestive system: Secondary | ICD-10-CM

## 2020-03-01 DIAGNOSIS — I119 Hypertensive heart disease without heart failure: Secondary | ICD-10-CM

## 2020-03-01 DIAGNOSIS — Z8639 Personal history of other endocrine, nutritional and metabolic disease: Secondary | ICD-10-CM

## 2020-03-01 DIAGNOSIS — L2089 Other atopic dermatitis: Secondary | ICD-10-CM

## 2020-03-01 DIAGNOSIS — K5730 Diverticulosis of large intestine without perforation or abscess without bleeding: Secondary | ICD-10-CM

## 2020-03-01 DIAGNOSIS — M5030 Other cervical disc degeneration, unspecified cervical region: Secondary | ICD-10-CM

## 2020-03-01 DIAGNOSIS — R768 Other specified abnormal immunological findings in serum: Secondary | ICD-10-CM

## 2020-03-01 DIAGNOSIS — M47816 Spondylosis without myelopathy or radiculopathy, lumbar region: Secondary | ICD-10-CM

## 2020-03-01 DIAGNOSIS — M19042 Primary osteoarthritis, left hand: Secondary | ICD-10-CM

## 2020-03-01 DIAGNOSIS — M810 Age-related osteoporosis without current pathological fracture: Secondary | ICD-10-CM

## 2020-03-01 DIAGNOSIS — Z720 Tobacco use: Secondary | ICD-10-CM

## 2020-03-07 ENCOUNTER — Encounter: Payer: Self-pay | Admitting: Neurology

## 2020-03-07 ENCOUNTER — Ambulatory Visit (INDEPENDENT_AMBULATORY_CARE_PROVIDER_SITE_OTHER): Payer: Medicare Other | Admitting: Neurology

## 2020-03-07 ENCOUNTER — Other Ambulatory Visit: Payer: Self-pay

## 2020-03-07 ENCOUNTER — Ambulatory Visit: Payer: Medicare Other | Admitting: Neurology

## 2020-03-07 DIAGNOSIS — G629 Polyneuropathy, unspecified: Secondary | ICD-10-CM

## 2020-03-07 DIAGNOSIS — G603 Idiopathic progressive neuropathy: Secondary | ICD-10-CM

## 2020-03-07 HISTORY — DX: Polyneuropathy, unspecified: G62.9

## 2020-03-07 NOTE — Progress Notes (Signed)
Please refer to EMG and nerve conduction procedure note.  

## 2020-03-07 NOTE — Progress Notes (Signed)
[facility]    Nerve / Sites Muscle Latency Ref. Amplitude Ref. Rel Amp Segments Distance Velocity Ref. Area    ms ms mV mV %  cm m/s m/s mVms  L Ulnar - ADM     Wrist ADM 2.6 ?3.3 7.7 ?6.0 100 Wrist - ADM 7   24.6     B.Elbow ADM 6.8  6.8  88.4 B.Elbow - Wrist 25 59 ?49 24.2     A.Elbow ADM 8.7  6.3  93.5 A.Elbow - B.Elbow 10 51 ?49 24.1         A.Elbow - Wrist      L Peroneal - EDB     Ankle EDB 5.2 ?6.5 2.4 ?2.0 100 Ankle - EDB 9   9.0     Fib head EDB 12.5  2.2  88.7 Fib head - Ankle 34 46 ?44 8.2     Pop fossa EDB 14.6  2.1  96 Pop fossa - Fib head 10 49 ?44 8.1         Pop fossa - Ankle      R Peroneal - EDB     Ankle EDB 5.4 ?6.5 3.0 ?2.0 100 Ankle - EDB 9   10.8     Fib head EDB 12.9  2.5  83.7 Fib head - Ankle 33 44 ?44 10.2     Pop fossa EDB 15.2  2.5  100 Pop fossa - Fib head 10 44 ?44 10.2         Pop fossa - Ankle      L Tibial - AH     Ankle AH 4.7 ?5.8 3.4 ?4.0 100 Ankle - AH 9   11.9     Pop fossa AH 15.5  3.1  90.8 Pop fossa - Ankle 42 39 ?41 10.4  R Tibial - AH     Ankle AH 5.5 ?5.8 3.2 ?4.0 100 Ankle - AH 9   10.9     Pop fossa AH 16.6  2.5  79.4 Pop fossa - Ankle 43 39 ?41 8.3                SNC    Nerve / Sites Rec. Site Peak Lat Ref.  Amp Ref. Segments Distance    ms ms V V  cm  L Radial - Anatomical snuff box (Forearm)     Forearm Wrist 2.6 ?2.9 16 ?15 Forearm - Wrist 10  L Sural - Ankle (Calf)     Calf Ankle 4.2 ?4.4 3 ?6 Calf - Ankle 14  R Sural - Ankle (Calf)     Calf Ankle 4.4 ?4.4 3 ?6 Calf - Ankle 14  L Superficial peroneal - Ankle     Lat leg Ankle 4.4 ?4.4 1 ?6 Lat leg - Ankle 14  R Superficial peroneal - Ankle     Lat leg Ankle NR ?4.4 NR ?6 Lat leg - Ankle 14  L Ulnar - Orthodromic, (Dig V, Mid palm)     Dig V Wrist 2.8 ?3.1 4 ?5 Dig V - Wrist 58                 F  Wave    Nerve F Lat Ref.   ms ms  L Tibial - AH 66.0 ?56.0  R Tibial - AH 69.4 ?56.0  L Ulnar - ADM 32.6 ?32.0

## 2020-03-07 NOTE — Procedures (Signed)
     HISTORY:  Christina Branch is a 79 year old patient with a 75-month history of sensory alteration in the feet with numbness and a leather type sensation of the feet.  She denies any pain in the back or down the legs on either side.  She does have some numbness in the hands as well.  She is being evaluated for a possible neuropathy or lumbosacral radiculopathy.  NERVE CONDUCTION STUDIES:  Nerve conduction study was performed on the left upper extremity.  The left ulnar nerve revealed a normal distal motor latency and motor amplitude with normal nerve conduction velocities for this nerve.  The left ulnar sensory latency was normal and the left radial sensory latency was normal.  The F-wave latency for the left ulnar nerve was slightly prolonged.  Nerve conduction studies were performed on both lower extremities.  The distal motor latencies for the peroneal and posterior tibial nerves were normal bilaterally with low motor amplitudes seen for the posterior tibial nerves bilaterally, normal for the peroneal nerves bilaterally.  Slowing was seen for the posterior tibial nerves bilaterally, borderline normal for the right peroneal nerve and normal for the left peroneal nerve.  The sensory latencies for the peroneal nerves were unobtainable on the right and borderline normal on the left.  The sural sensory latency on the right was borderline normal and was normal on the left.  The F-wave latencies for the posterior tibial nerves were prolonged bilaterally.  EMG STUDIES:  EMG study was performed on the left lower extremity:  The tibialis anterior muscle reveals 2 to 4K motor units with full recruitment. No fibrillations or positive waves were seen. The peroneus tertius muscle reveals 1 to 3K motor units with decreased recruitment. No fibrillations or positive waves were seen.  Polyphasic motor units were seen. The medial gastrocnemius muscle reveals 1 to 3K motor units with full recruitment. No  fibrillations or positive waves were seen. The vastus lateralis muscle reveals 2 to 4K motor units with full recruitment. No fibrillations or positive waves were seen. The iliopsoas muscle reveals 2 to 4K motor units with full recruitment. No fibrillations or positive waves were seen. The biceps femoris muscle (long head) reveals 2 to 4K motor units with full recruitment. No fibrillations or positive waves were seen. The lumbosacral paraspinal muscles were tested at 3 levels, and revealed no abnormalities of insertional activity at all 3 levels tested. There was good relaxation.   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  EMG evaluation of the left lower extremity showed some minimal chronic distal neuropathic signs of denervation consistent with peripheral neuropathy, there is no evidence of an overlying lumbosacral radiculopathy.  Jill Alexanders MD 03/07/2020 1:34 PM  [facility] [address] [place], [place] [address]  Phone [phone] Fax [phone]

## 2020-05-31 ENCOUNTER — Telehealth: Payer: Self-pay | Admitting: Rheumatology

## 2020-05-31 NOTE — Telephone Encounter (Signed)
Requested infomration sent to PCP: Dr. Katherine Roan

## 2020-05-31 NOTE — Telephone Encounter (Signed)
Dorothea Ogle from Dr. Oralia Rud office left a voicemail stating Christina Branch is a mutual patient who was seen by Dr. Estanislado Pandy in March and April of this year.  We would like the office notes, labs, x-rays from her appointment faxed to our office [phone].  If you have any questions, please call [phone]

## 2020-08-02 ENCOUNTER — Other Ambulatory Visit: Payer: Self-pay | Admitting: Pulmonary Disease

## 2020-08-02 ENCOUNTER — Other Ambulatory Visit (HOSPITAL_COMMUNITY): Payer: Self-pay | Admitting: Pulmonary Disease

## 2020-08-02 DIAGNOSIS — R1902 Left upper quadrant abdominal swelling, mass and lump: Secondary | ICD-10-CM

## 2020-08-04 ENCOUNTER — Telehealth: Payer: Self-pay | Admitting: Pulmonary Disease

## 2020-08-05 ENCOUNTER — Ambulatory Visit (HOSPITAL_COMMUNITY): Payer: Medicare Other

## 2020-08-23 ENCOUNTER — Other Ambulatory Visit: Payer: Self-pay

## 2020-08-23 ENCOUNTER — Ambulatory Visit (HOSPITAL_COMMUNITY)
Admission: RE | Admit: 2020-08-23 | Discharge: 2020-08-23 | Disposition: A | Discharge status: Home / Self Care | Payer: Medicare Other | Source: Ambulatory Visit | Attending: Pulmonary Disease | Admitting: Pulmonary Disease

## 2020-08-23 DIAGNOSIS — R1902 Left upper quadrant abdominal swelling, mass and lump: Secondary | ICD-10-CM | POA: Diagnosis not present

## 2020-09-24 ENCOUNTER — Ambulatory Visit: Payer: Medicare Other | Attending: Internal Medicine

## 2020-09-24 ENCOUNTER — Other Ambulatory Visit: Payer: Self-pay

## 2020-09-24 DIAGNOSIS — Z23 Encounter for immunization: Secondary | ICD-10-CM

## 2020-09-24 NOTE — Progress Notes (Signed)
   Covid-19 Vaccination Clinic  Name:  Christina Branch    MRN: [id number] DOB: [birth year]  09/24/2020  [name] was observed post Covid-19 immunization for 15 minutes without incident. She was provided with Vaccine Information Sheet and instruction to access the V-Safe system.   [name] was instructed to call 911 with any severe reactions post vaccine: Marland Kitchen Difficulty breathing  . Swelling of face and throat  . A fast heartbeat  . A bad rash all over body  . Dizziness and weakness

## 2021-04-05 ENCOUNTER — Ambulatory Visit: Payer: Self-pay

## 2021-04-05 ENCOUNTER — Ambulatory Visit: Payer: Medicare Other | Admitting: Orthopedic Surgery

## 2021-04-05 DIAGNOSIS — M79604 Pain in right leg: Secondary | ICD-10-CM

## 2021-04-05 DIAGNOSIS — M1611 Unilateral primary osteoarthritis, right hip: Secondary | ICD-10-CM | POA: Diagnosis not present

## 2021-04-05 MED ORDER — PREDNISONE 5 MG (21) PO TBPK: ORAL_TABLET | ORAL | 0 refills | Status: DC

## 2021-04-06 ENCOUNTER — Encounter: Payer: Self-pay | Admitting: Orthopedic Surgery

## 2021-04-06 NOTE — Progress Notes (Signed)
Office Visit Note   Patient: Christina Branch           Date of Birth: 02/04/1941           MRN: [id number] Visit Date: 04/05/2021 Requested by: Vincente Liberty, MD [address] [place],  [place] [address] PCP: Vincente Liberty, MD  Subjective: Chief Complaint  Patient presents with  . Right Hip - Pain    HPI: Christina Branch is a 80 y.o. female who presents to the office complaining of right hip pain over the last 3 weeks.  Patient denies any recent injury.  She complains of right lateral groin pain with some buttock pain and posterior buttock pain that radiates down to the mid hamstring.  Denies any low back pain.  Pain is better with sitting and is worse with standing and walking.  She denies any history of previous issues.  Pain does not wake her up at night and she is able to lay on her right side.  Coughing makes her pain worse.  No history of back surgery.  She does have history of prior hip pinning at 80 years old.  No new numbness or tingling in the legs though she does have history of neuropathy.  She has started to notice some left-sided symptoms similar to the right side but this is only been in the last couple days.  Right side is much more of a problem at this time..                ROS: All systems reviewed are negative as they relate to the chief complaint within the history of present illness.  Patient denies fevers or chills.  Assessment & Plan: Visit Diagnoses:  1. Unilateral primary osteoarthritis, right hip   2. Pain in right leg     Plan: Patient is an 80 year old female who presents complaining of right hip pain.  She has history of right hip pinning when she was 79 years old.  She has had pain that she has noticed in the last 3 weeks that is mostly localized to the lateral groin but also has some buttock pain that radiates into the mid hamstring.  Physical exam suggests that most of her pain is coming from the right hip joint.  She had radiographs taken today  that show severe end-stage arthritis of the right hip joint with intact hardware from her prior surgery.  Lumbar spine radiographs do not show anything nearly as impressive as the right hip arthritis that she has.  There is a small amount of spondylolisthesis at L5-S1 but overall her lumbar spine looks okay.  Plan to try Medrol Dosepak for hip arthritis.  This does not look like it is coming from the back.  She is not interested in hip replacement.  Hip replacement would be exceedingly difficult.  Injection not really feasible in this case due to the severity of the arthritis.  She is actually doing very well based on the amount of arthritis present Follow-Up Instructions: No follow-ups on file.   Orders:  Orders Placed This Encounter  Procedures  . XR HIP UNILAT W OR W/O PELVIS 2-3 VIEWS RIGHT  . XR Lumbar Spine 2-3 Views  . Ambulatory referral to Physical Medicine Rehab   Meds ordered this encounter  Medications  . predniSONE (STERAPRED UNI-PAK 21 TAB) 5 MG (21) TBPK tablet    Sig: Take dosepak as directed    Dispense:  21 tablet    Refill:  0  Procedures: No procedures performed   Clinical Data: No additional findings.  Objective: Vital Signs: There were no vitals taken for this visit.  Physical Exam:  Constitutional: Patient appears well-developed HEENT:  Head: Normocephalic Eyes:EOM are normal Neck: Normal range of motion Cardiovascular: Normal rate Pulmonary/chest: Effort normal Neurologic: Patient is alert Skin: Skin is warm Psychiatric: Patient has normal mood and affect  Ortho Exam: Ortho exam demonstrates right hip with severely limited internal rotation.  Decreased passive hip flexion compared with the contralateral side.  Lateral right groin pain noted with internal rotation of the right hip as well as positive Stinchfield sign.  Negative straight leg raise.  5/5 motor strength of bilateral hip flexor, quadricep, hamstring, dorsiflexion, plantarflexion.   Sensation intact through all dermatomes of bilateral lower extremities.  Normoreflexic bilateral patellar and Achilles tendon reflexes.  No tenderness over the greater trochanter bilaterally.  No tenderness throughout the axial lumbar spine.  Pain is no worse with passive extension and flexion of the lumbar spine.  Specialty Comments:  No specialty comments available.  Imaging: No results found.   PMFS History: Patient Active Problem List   Diagnosis Date Noted  . Peripheral neuropathy 03/07/2020  . Primary osteoarthritis of both knees 02/02/2020  . Primary osteoarthritis of both hands 02/02/2020  . Arthropathy of lumbar facet joint 02/02/2020  . 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 DDD (degenerative disc disease), cervical 02/02/2020  . Atherosclerosis of native artery of extremity with intermittent claudication ([place]) 08/01/2012  . Pain in limb 07/04/2012  . Peripheral vascular disease, unspecified ([place]) 07/04/2012  . Intermittent claudication ([place]) 06/29/2011   Past Medical History:  Diagnosis Date  . Claudication ([place])   . DJD (degenerative joint disease) of knee   . GERD (gastroesophageal reflux disease)   . H/O: varicose veins    Right Leg  . Hyperlipidemia   . Hypertension   . Hypoxemia   . Myalgia and myositis, unspecified   . Pain in limb   . Peripheral neuropathy 03/07/2020  . Varicose veins     Family History  Problem Relation Age of Onset  . Cancer Mother   . Breast cancer Sister   . Hypertension Son     Past Surgical History:  Procedure Laterality Date  . CHOLECYSTECTOMY     Open cholecystectomy  . COLON SURGERY     partial resection due to large polyps (Benign)  . FRACTURE SURGERY  1953   ORIF right hip  . HEMORRHOID SURGERY     Social History   Occupational History  . Not on file  Tobacco Use  . Smoking status: Current Every Day Smoker    Packs/day: 0.10    Years: 30.00    Pack years: 3.00     Types: Cigarettes  . Smokeless tobacco: Never Used  Vaping Use  . Vaping Use: Never used  Substance and Sexual Activity  . Alcohol use: No  . Drug use: No  . Sexual activity: Not on file

## 2021-05-17 ENCOUNTER — Other Ambulatory Visit: Payer: Self-pay

## 2021-05-17 ENCOUNTER — Ambulatory Visit: Payer: Self-pay

## 2021-05-17 ENCOUNTER — Ambulatory Visit (INDEPENDENT_AMBULATORY_CARE_PROVIDER_SITE_OTHER): Payer: Medicare Other | Admitting: Physical Medicine and Rehabilitation

## 2021-05-17 ENCOUNTER — Encounter: Payer: Self-pay | Admitting: Physical Medicine and Rehabilitation

## 2021-05-17 DIAGNOSIS — M1611 Unilateral primary osteoarthritis, right hip: Secondary | ICD-10-CM

## 2021-05-17 NOTE — Progress Notes (Signed)
Pt state right hip spasm not pain. Pt state when she takes a step she feels something but it not pain its a grip feeling. Pt state she can feel the muscle sink in on her right thigh..  Pt state it happen while she walking. Pt state she take pain meds to help ease her pain.   Numeric Pain Rating Scale and Functional Assessment Average Pain 1   In the last MONTH (on 0-10 scale) has pain interfered with the following?  1. General activity like being  able to carry out your everyday physical activities such as walking, climbing stairs, carrying groceries, or moving a chair?  Rating(10)   -BT, -Dye Allergies.

## 2021-05-21 MED ORDER — TRIAMCINOLONE ACETONIDE 40 MG/ML IJ SUSP
60.0000 mg | INTRAMUSCULAR | Status: AC | PRN
  Administered 2021-05-17: 60 mg via INTRA_ARTICULAR

## 2021-05-21 MED ORDER — BUPIVACAINE HCL 0.25 % IJ SOLN
4.0000 mL | INTRAMUSCULAR | Status: AC | PRN
  Administered 2021-05-17: 4 mL via INTRA_ARTICULAR

## 2021-05-21 NOTE — Progress Notes (Signed)
   Christina Branch - 80 y.o. female MRN [id number]  Date of birth: 1941-07-04  Office Visit Note: Visit Date: 05/17/2021 PCP: Vincente Liberty, MD Referred by: Vincente Liberty, MD  Subjective: Chief Complaint  Patient presents with   Right Hip - Pain   HPI:  Christina Branch is a 80 y.o. female who comes in today For planned intra-articular right hip injection with fluoroscopic guidance at the request of Dr. Anderson Malta.  Emphatically states to me that she is not having right hip pain but a lot of spasming.  She has prior hip pinning when she was 80 years old and now osteoarthritis of the hip.  She will continue to follow-up with Dr. Marlou Sa depending on relief.  Also as of note she has seen Dr. 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  ROS Otherwise per HPI.  Assessment & Plan: Visit Diagnoses:    ICD-10-CM   1. Unilateral primary osteoarthritis, right hip  M16.11 XR C-ARM NO REPORT      Plan: No additional findings.   Meds & Orders: No orders of the defined types were placed in this encounter.   Orders Placed This Encounter  Procedures   Large Joint Inj   XR C-ARM NO REPORT    Follow-up: No follow-ups on file.   Procedures: Large Joint Inj: R hip joint on 05/17/2021 3:00 PM Indications: diagnostic evaluation and pain Details: 22 G 3.5 in needle, fluoroscopy-guided anterior approach  Arthrogram: No  Medications: 4 mL bupivacaine 0.25 %; 60 mg triamcinolone acetonide 40 MG/ML Outcome: tolerated well, no immediate complications  There was excellent flow of contrast producing a partial arthrogram of the hip. The patient did NOT have relief of symptoms during the anesthetic phase of the injection. Procedure, treatment alternatives, risks and benefits explained, specific risks discussed. Consent was given by the patient. Immediately prior to procedure a time out was called to verify the correct patient, procedure, equipment, support staff and  site/side marked as required. Patient was prepped and draped in the usual sterile fashion.         Clinical History: No specialty comments available.     Objective:  VS:  HT:    WT:   BMI:     BP:   HR: bpm  TEMP: ( )  RESP:  Physical Exam   Imaging: No results found.

## 2021-06-16 ENCOUNTER — Other Ambulatory Visit (HOSPITAL_COMMUNITY): Payer: Self-pay | Admitting: Pulmonary Disease

## 2021-06-16 DIAGNOSIS — R0789 Other chest pain: Secondary | ICD-10-CM

## 2021-06-23 ENCOUNTER — Other Ambulatory Visit: Payer: Self-pay

## 2021-06-23 ENCOUNTER — Ambulatory Visit (HOSPITAL_COMMUNITY)
Admission: RE | Admit: 2021-06-23 | Discharge: 2021-06-23 | Disposition: A | Discharge status: Home / Self Care | Payer: Medicare Other | Source: Ambulatory Visit | Attending: Pulmonary Disease | Admitting: Pulmonary Disease

## 2021-06-23 DIAGNOSIS — R0789 Other chest pain: Secondary | ICD-10-CM

## 2021-07-25 ENCOUNTER — Encounter: Payer: Self-pay | Admitting: Internal Medicine

## 2021-07-25 ENCOUNTER — Other Ambulatory Visit: Payer: Self-pay

## 2021-07-25 ENCOUNTER — Ambulatory Visit: Payer: Medicare Other | Admitting: Internal Medicine

## 2021-07-25 VITALS — BP 124/78 | HR 69 | Temp 98.2°F | Ht 71.0 in | Wt 179.4 lb

## 2021-07-25 DIAGNOSIS — Z87891 Personal history of nicotine dependence: Secondary | ICD-10-CM | POA: Diagnosis not present

## 2021-07-25 DIAGNOSIS — R0609 Other forms of dyspnea: Secondary | ICD-10-CM

## 2021-07-25 DIAGNOSIS — R0600 Dyspnea, unspecified: Secondary | ICD-10-CM

## 2021-07-25 NOTE — Patient Instructions (Addendum)
ICD-10-CM   1. Dyspnea on exertion  R06.00     2. Stopped smoking with greater than 20 pack year history  Z87.891      Many potential reasons for shortness of breath This includes COPD, asthma, pulmonary fibrosis, cardiac issues, anemia  Plan - Do full pulmonary function test - Do high-resolution CT chest supine and prone - Do echocardiogram - Do blood work CBC with differential, chemistry panel and liver function test today -Respect to declining trying empiric Spiriva inhaler until results are ready  Follow-up - Return to see nurse practitioner to discuss test results in the next few to several weeks

## 2021-07-25 NOTE — Progress Notes (Signed)
OV 07/25/2021  Subjective:  Patient ID: Christina Branch, female , DOB: 1941/11/26 , age 80 y.o. , MRN: [id number] , ADDRESS: [place] [id number] PCP Vincente Liberty, MD Patient Care Team: Vincente Liberty, MD as PCP - General (Pulmonary Disease) Marlou Sa Tonna Corner, MD (Orthopedic Surgery)  This Provider for this visit: Treatment Team:  Attending Provider: Brand Males, MD    07/25/2021 -   Chief Complaint  Patient presents with   Consult    Pt had a cxr performed which is the reason for the referral. States that she does have complaints of SOB when she exerts herself.     HPI Christina Branch 80 y.o. -referred by Dr. Vincente Liberty for shortness of breath.  She tells me that she has insidious onset of shortness of breath for few months.  There is a specific can gather.  History is not fully forthcoming.  She definitely denies shortness of breath for few years.  Definitely denies shortness of breath for several months.  It is stable since onset.  It is very mild and only on heavy exertion there is no associated chest pain.  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  When I offered empiric Spiriva for this she declined saying she does not exert herself much and so she does not want an inhaler.  However she is willing to undergo test work-up.  She is willing to consider treatment based on test results.  There is no associated chest pain or cough orthopnea paroxysmal nocturnal dyspnea or edema.  Walking desaturation test showed a tendency to drop by 3 points.  Old blood work here showed polycythemia Chest x-ray shows hyperinflation CT scan abdomen lung image shows clear lung fields Serology work-up in the last few years is negative   SYMPTOM SCALE - 07/25/2021   O2 use ra  Shortness of Breath 0 -> 5 scale with 5 being worst (score 6 If  unable to do)  At rest 0  Simple tasks - showers, clothes change, eating, shaving 1  Household (dishes, doing bed, laundry) 3  Shopping 4  Walking level at own pace 3  Walking up Stairs 3  Total (30-36) Dyspnea Score 14  How bad is your cough? 0  How bad is your fatigue 3  How bad is nausea 0  How bad is vomiting?  0  How bad is diarrhea? 0  How bad is anxiety? 2  How bad is depression 0      CT Chest data     March 2021  Results for Christina Branch (MRN [id number]) as of 07/25/2021 15:37  Ref. Range [id number]  Cyclic Citrullin Peptide Ab Latest Units: UNITS <16  ds DNA Ab Latest Units: IU/mL 1  ENA SM Ab Ser-aCnc Latest Ref Range: <1.0 NEG AI <1.0 NEG  C3 Complement Latest Ref Range: 83 - 193 mg/dL 132  C4 Complement Latest Ref Range: 15 - 57 mg/dL 33  Ribonucleic Protein(ENA) Antibody, IgG Latest Ref Range: <1.0 NEG AI <1.0 NEG  SSA (Ro) (ENA) Antibody, IgG Latest Ref Range: <1.0 NEG AI <1.0 NEG  SSB (La) (ENA) Antibody, IgG Latest Ref Range: <1.0 NEG AI <1.0 NEG  Scleroderma (Scl-70) (ENA) Antibody, IgG Latest Ref Range: <1.0 NEG AI <1.0 NEG  Results for Christina Branch (MRN [id number]) as of 07/25/2021  15:37  Ref. Range 06/17/2014 22:17  Hemoglobin Latest Ref Range: 12.0 - 15.0 g/dL 17.1 (H)    No results found.    PFT  No flowsheet data found.  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   reports that she has been smoking cigarettes. She started smoking about 58 years ago. She has a 20.00 pack-year smoking history. She has never used smokeless tobacco.  Past Surgical History:  Procedure Laterality Date   CHOLECYSTECTOMY     Open cholecystectomy   COLON SURGERY     partial resection due to large polyps (Benign)   FRACTURE SURGERY  1953   ORIF right hip   HEMORRHOID SURGERY      Allergies  Allergen Reactions   Penicillins Hives   Shellfish Allergy Swelling   Sulfa Antibiotics Nausea And Vomiting   Tuna [Fish Allergy] Swelling    Immunization History  Administered Date(s) Administered   PFIZER(Purple Top)SARS-COV-2 Vaccination 01/29/2020, 02/24/2020, 09/24/2020    Family History  Problem Relation Age of Onset   Cancer Mother    Breast cancer Sister    Hypertension Son      Current Outpatient Medications:    amLODipine (NORVASC) 10 MG tablet, Take 10 mg by mouth daily. , Disp: , Rfl:    diclofenac Sodium (VOLTAREN) 1 % GEL, APPLY EXTERNALLY TO THE LEFT ANKLE AND LEFT FOOT EVERY 12 HOURS, Disp: , Rfl:    HYDROcodone-ibuprofen (VICOPROFEN) 7.5-200 MG per tablet, Take 1 tablet by mouth every 6 (six) hours as needed for moderate pain. , Disp: , Rfl:    loratadine (CLARITIN) 10 MG tablet, TK 1 T PO QD., Disp: , Rfl: 4   Multiple Vitamins-Minerals (CENTRUM SILVER PO), Take 1 tablet by mouth daily. , Disp: , Rfl:    Vitamin D, Ergocalciferol, (DRISDOL) 50000 units CAPS capsule, TK 1 C PO Q WK, Disp: , Rfl: 4      Objective:   Vitals:   07/25/21 1459  BP: 124/78  Pulse: 69  Temp: 98.2 F (36.8 C)  TempSrc: Oral  SpO2: 99%  Weight: 179 lb 6.4 oz (81.4 kg)  Height: '5\' 11"'$  (1.803 m)    Estimated body mass index is 25.02 kg/m as calculated from the following:   Height as of this encounter: '5\' 11"'$  (1.803 m).   Weight as of this encounter: 179 lb 6.4 oz (81.4 kg).  '@WEIGHTCHANGE'$ @  [facility]   07/25/21 1459  Weight: 179 lb 6.4 oz (81.4 kg)     Physical Exam  General Appearance:    Alert, cooperative, no distress, appears stated age - yes , Deconditioned looking - no , OBESE  - no, Sitting on Wheelchair -  no  Head:    Normocephalic, without obvious abnormality, atraumatic  Eyes:    PERRL, conjunctiva/corneas clear,  Ears:    Normal TM's and external ear canals, both ears  Nose:   Nares normal, septum midline, mucosa normal, no drainage    or sinus tenderness. OXYGEN ON  - no . Patient is @ ra   Throat:   Lips, mucosa, and tongue normal; teeth and gums normal. Cyanosis on lips - no  Neck:   Supple, symmetrical, trachea midline, no adenopathy;    thyroid:  no enlargement/tenderness/nodules; no carotid   bruit or JVD  Back:     Symmetric, no curvature, ROM normal, no CVA tenderness  Lungs:     Distress - no , Wheeze no, Barrell Chest - no, Purse lip breathing - no, Crackles - no   Chest Wall:    No tenderness or deformity.    Heart:    Regular rate and rhythm, S1 and S2 normal, no rub   or gallop, Murmur - no  Breast Exam:    NOT DONE  Abdomen:     Soft, non-tender, bowel sounds active all four quadrants,    no masses, no organomegaly. Visceral obesity - no  Genitalia:   NOT DONE  Rectal:   NOT DONE  Extremities:   Extremities - normal, Has Cane - no, Clubbing - no, Edema - no  Pulses:   2+ and symmetric all extremities  Skin:   Stigmata of Connective Tissue Disease - no  Lymph nodes:   Cervical, supraclavicular, and axillary nodes normal  Psychiatric:  Neurologic:   Pleasant - yes, Anxious - no, Flat affect - no  CAm-ICU - neg, Alert and Oriented x 3 - yes, Moves all 4s - yes, Speech - normal, Cognition - intact      Assessment:       ICD-10-CM   1. Dyspnea on exertion  R06.00     2. Stopped smoking with greater than 20 pack year history  Z87.891          Plan:     Patient Instructions     ICD-10-CM   1. Dyspnea on exertion  R06.00     2.  Stopped smoking with greater than 20 pack year history  Z87.891      Many potential reasons for shortness of breath This includes COPD, asthma, pulmonary fibrosis, cardiac issues, anemia  Plan - Do full pulmonary function test - Do high-resolution CT chest supine and prone - Do echocardiogram - Do blood work CBC with differential, chemistry panel and liver function test today -Respect to declining trying empiric Spiriva inhaler until results are ready  Follow-up - Return to see nurse practitioner to discuss test results in the next few to several weeks    SIGNATURE    Dr. Brand Males, M.D., F.C.C.P,  Pulmonary and Critical Care Medicine Staff Physician, [place] Director - Interstitial Lung Disease  Program  Pulmonary [place] at [place], [place], [address]  Pager: [phone], If no answer or between  15:00h - 7:00h: call [phone] Telephone: [phone]  3:53 PM 07/25/2021

## 2021-07-26 LAB — CBC WITH DIFFERENTIAL/PLATELET
Basophils Absolute: 0.1 10*3/uL (ref 0.0–0.1)
Basophils Relative: 1.2 % (ref 0.0–3.0)
Eosinophils Absolute: 0.2 10*3/uL (ref 0.0–0.7)
Eosinophils Relative: 2 % (ref 0.0–5.0)
HCT: 43.6 % (ref 36.0–46.0)
Hemoglobin: 14 g/dL (ref 12.0–15.0)
Lymphocytes Relative: 15.5 % (ref 12.0–46.0)
Lymphs Abs: 1.5 10*3/uL (ref 0.7–4.0)
MCHC: 32.1 g/dL (ref 30.0–36.0)
MCV: 92.7 fl (ref 78.0–100.0)
Monocytes Absolute: 0.7 10*3/uL (ref 0.1–1.0)
Monocytes Relative: 7.6 % (ref 3.0–12.0)
Neutro Abs: 7 10*3/uL (ref 1.4–7.7)
Neutrophils Relative %: 73.7 % (ref 43.0–77.0)
Platelets: 406 10*3/uL — ABNORMAL HIGH (ref 150.0–400.0)
RBC: 4.7 Mil/uL (ref 3.87–5.11)
RDW: 15.2 % (ref 11.5–15.5)
WBC: 9.4 10*3/uL (ref 4.0–10.5)

## 2021-07-26 LAB — BASIC METABOLIC PANEL
BUN: 14 mg/dL (ref 6–23)
CO2: 27 mEq/L (ref 19–32)
Calcium: 10.6 mg/dL — ABNORMAL HIGH (ref 8.4–10.5)
Chloride: 104 mEq/L (ref 96–112)
Creatinine, Ser: 1.18 mg/dL (ref 0.40–1.20)
GFR: 43.4 mL/min — ABNORMAL LOW (ref >60.00)
Glucose, Bld: 98 mg/dL (ref 70–99)
Potassium: 4.7 mEq/L (ref 3.5–5.1)
Sodium: 137 mEq/L (ref 135–145)

## 2021-07-26 LAB — HEPATIC FUNCTION PANEL
ALT: 11 U/L (ref 0–35)
AST: 16 U/L (ref 0–37)
Albumin: 3.8 g/dL (ref 3.5–5.2)
Alkaline Phosphatase: 70 U/L (ref 39–117)
Bilirubin, Direct: 0 mg/dL (ref 0.0–0.3)
Total Bilirubin: 0.7 mg/dL (ref 0.2–1.2)
Total Protein: 6.5 g/dL (ref 6.0–8.3)

## 2021-07-28 ENCOUNTER — Ambulatory Visit (INDEPENDENT_AMBULATORY_CARE_PROVIDER_SITE_OTHER)
Admission: RE | Admit: 2021-07-28 | Discharge: 2021-07-28 | Disposition: A | Discharge status: Home / Self Care | Payer: Medicare Other | Source: Ambulatory Visit | Attending: Internal Medicine | Admitting: Internal Medicine

## 2021-07-28 ENCOUNTER — Other Ambulatory Visit: Payer: Self-pay

## 2021-07-28 DIAGNOSIS — R0600 Dyspnea, unspecified: Secondary | ICD-10-CM

## 2021-07-28 DIAGNOSIS — R0609 Other forms of dyspnea: Secondary | ICD-10-CM

## 2021-08-03 ENCOUNTER — Other Ambulatory Visit: Payer: Self-pay

## 2021-08-03 ENCOUNTER — Ambulatory Visit: Payer: Medicare Other | Admitting: Adult Health

## 2021-08-03 ENCOUNTER — Ambulatory Visit (INDEPENDENT_AMBULATORY_CARE_PROVIDER_SITE_OTHER): Payer: Medicare Other | Admitting: Internal Medicine

## 2021-08-03 ENCOUNTER — Encounter: Payer: Self-pay | Admitting: Adult Health

## 2021-08-03 DIAGNOSIS — J438 Other emphysema: Secondary | ICD-10-CM | POA: Diagnosis not present

## 2021-08-03 DIAGNOSIS — R0600 Dyspnea, unspecified: Secondary | ICD-10-CM

## 2021-08-03 DIAGNOSIS — Z720 Tobacco use: Secondary | ICD-10-CM

## 2021-08-03 DIAGNOSIS — R0609 Other forms of dyspnea: Secondary | ICD-10-CM

## 2021-08-03 DIAGNOSIS — F17210 Nicotine dependence, cigarettes, uncomplicated: Secondary | ICD-10-CM | POA: Diagnosis not present

## 2021-08-03 DIAGNOSIS — J439 Emphysema, unspecified: Secondary | ICD-10-CM | POA: Insufficient documentation

## 2021-08-03 LAB — PULMONARY FUNCTION TEST
DL/VA % pred: 87 %
DL/VA: 3.41 ml/min/mmHg/L
DLCO cor % pred: 72 %
DLCO cor: 16.52 ml/min/mmHg
DLCO unc % pred: 73 %
DLCO unc: 16.82 ml/min/mmHg
FEF 25-75 Pre: 1.22 L/sec
FEF2575-%Pred-Pre: 70 %
FEV1-%Pred-Pre: 95 %
FEV1-Pre: 2.01 L
FEV1FVC-%Pred-Pre: 97 %
FEV6-%Pred-Pre: 105 %
FEV6-Pre: 2.73 L
FEV6FVC-%Pred-Pre: 103 %
FVC-%Pred-Pre: 101 %
FVC-Pre: 2.74 L
Pre FEV1/FVC ratio: 73 %
Pre FEV6/FVC Ratio: 100 %
RV % pred: 86 %
RV: 2.34 L
TLC % pred: 84 %
TLC: 5.05 L

## 2021-08-03 MED ORDER — ALBUTEROL SULFATE HFA 108 (90 BASE) MCG/ACT IN AERS: 1.0000 | INHALATION_SPRAY | Freq: Four times a day (QID) | RESPIRATORY_TRACT | 2 refills | Status: DC | PRN

## 2021-08-03 NOTE — Assessment & Plan Note (Signed)
Smoking cessation discussed 

## 2021-08-03 NOTE — Assessment & Plan Note (Signed)
Mild emphysema on CT.  Patient is encouraged on smoking cessation. Despite long history of smoking.  Patient has no significant airflow obstruction or restriction on PFTs.  Suspect her shortness of breath has some relation to her underlying mild emphysema age and deconditioning.  2D echo is pending. Recommend Spiriva however patient declines.  We will give her albuterol to use as needed.  Activity as tolerated  Plan  Patient Instructions  Albuterol inhaler 1-2 puffs every 6hr as needed for shortness of breath.  2 D echo as planned later this month  Mucinex DM Twice daily  As needed  cough/congestion  Work on not smoking .  Activity as tolerated.  Follow up with Dr. Chase Caller in 4 months and As needed

## 2021-08-03 NOTE — Patient Instructions (Addendum)
Albuterol inhaler 1-2 puffs every 6hr as needed for shortness of breath.  2 D echo as planned later this month  Mucinex DM Twice daily  As needed  cough/congestion  Work on not smoking .  Activity as tolerated.  Follow up with Dr. Chase Caller in 4 months and As needed

## 2021-08-03 NOTE — Progress Notes (Signed)
$'@Patient'B$  ID: Christina Branch, female    DOB: 08/15/1941, 80 y.o.   MRN: [id number]  Chief Complaint  Patient presents with   Follow-up    Referring provider: Vincente Liberty, MD  HPI: 80 year old female active smoker seen for pulmonary consult July 25, 2021 for shortness of breath x6 months.   TEST/EVENTS :    Results for Christina, Branch (MRN [id number]) as of 07/25/2021 15:37   Ref. Range 0000000 [id number]  Cyclic Citrullin Peptide Ab Latest Units: UNITS <16  ds DNA Ab Latest Units: IU/mL 1  ENA SM Ab Ser-aCnc Latest Ref Range: <1.0 NEG AI <1.0 NEG  C3 Complement Latest Ref Range: 83 - 193 mg/dL 132  C4 Complement Latest Ref Range: 15 - 57 mg/dL 33  Ribonucleic Protein(ENA) Antibody, IgG Latest Ref Range: <1.0 NEG AI <1.0 NEG  SSA (Ro) (ENA) Antibody, IgG Latest Ref Range: <1.0 NEG AI <1.0 NEG  SSB (La) (ENA) Antibody, IgG Latest Ref Range: <1.0 NEG AI <1.0 NEG  Scleroderma (Scl-70) (ENA) Antibody, IgG Latest Ref Range: <1.0 NEG AI <1.0 NEG    08/03/2021 Follow up ; Emphysema , Dyspnea  Patient returns for a 1 week follow-up.  Patient was seen last visit for a pulmonary consult.  Patient's been having shortness of breath with activity over the last 6 months.  She is an active smoker.  Has smoked for many years.  She has intermittent cough.  Patient was set up for pulmonary function testing that showed no significant airflow obstruction or restriction.  Slightly decreased diffusing capacity.  FEV1 at 95%, ratio 73, FVC 101%.,  DLCO 73%.  High-res CT chest showed mild emphysema and bronchiectasis.  Right upper lobe granuloma.  Incidental findings were thyroid nodules, atherosclerosis, biliary ductal dilation (previous cholecystectomy), left adrenal nodule and left kidney angiomyolipoma.  We discussed this in detail and advised her to follow-up with her primary care provider for further evaluation if indicated Lab work showed no evidence of anemia.  Electrolyte panel showed chronic kidney  disease liver function testing was normal. Patient has been set up for 2D echo which is pending.  Patient was recommended to begin Spiriva.  Patient declined.  Today in the office we discussed all her test results.  I recommend that she begin Spiriva as well.  Patient says she does not want to be on an inhaler every day.  She feels that her shortness of breath is not that significant.  She has no significant cough or wheezing.  She says she would like to have a inhaler to use if needed on occasion but does not want an every day medication She declines a flu shot She denies any chest pain.   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[place])    DJD (degenerative joint disease) of knee    GERD (gastroesophageal reflux disease)    H/O: varicose veins    Right Leg   Hyperlipidemia    Hypertension    Hypoxemia    Myalgia and myositis, unspecified    Pain in limb    Peripheral neuropathy 03/07/2020   Varicose veins     Tobacco History: Social History   Tobacco Use  Smoking Status Every Day   Packs/day:  0.50   Years: 40.00   Pack years: 20.00   Types: Cigarettes   Start date: 11/26/1962  Smokeless Tobacco Never  Tobacco Comments   Currently smoking 2-3 cigs a day as of 08/03/21 bnm   Ready to quit: No Counseling given: Yes Tobacco comments: Currently smoking 2-3 cigs a day as of 08/03/21 bnm   Outpatient Medications Prior to Visit  Medication Sig Dispense Refill   amLODipine (NORVASC) 10 MG tablet Take 10 mg by mouth daily.      diclofenac Sodium (VOLTAREN) 1 % GEL APPLY EXTERNALLY TO THE LEFT ANKLE AND LEFT FOOT EVERY 12 HOURS     HYDROcodone-ibuprofen (VICOPROFEN) 7.5-200 MG per  tablet Take 1 tablet by mouth every 6 (six) hours as needed for moderate pain.      Multiple Vitamins-Minerals (CENTRUM SILVER PO) Take 1 tablet by mouth daily.      Vitamin D, Ergocalciferol, (DRISDOL) 50000 units CAPS capsule TK 1 C PO Q WK  4   loratadine (CLARITIN) 10 MG tablet TK 1 T PO QD. (Patient not taking: Reported on 08/03/2021)  4   No facility-administered medications prior to visit.     Review of Systems:   Constitutional:   No  weight loss, night sweats,  Fevers, chills, fatigue, or  lassitude.  HEENT:   No headaches,  Difficulty swallowing,  Tooth/dental problems, or  Sore throat,                No sneezing, itching, ear ache, nasal congestion, post nasal drip,   CV:  No chest pain,  Orthopnea, PND, swelling in lower extremities, anasarca, dizziness, palpitations, syncope.   GI  No heartburn, indigestion, abdominal pain, nausea, vomiting, diarrhea, change in bowel habits, loss of appetite, bloody stools.   Resp:  No excess mucus, no productive cough,  No non-productive cough,  No coughing up of blood.  No change in color of mucus.  No wheezing.  No chest wall deformity  Skin: no rash or lesions.  GU: no dysuria, change in color of urine, no urgency or frequency.  No flank pain, no hematuria   MS:  No joint pain or swelling.  No decreased range of motion.  No back pain.    Physical Exam  BP (!) 148/70 (BP Location: Left Arm, Patient Position: Sitting, Cuff Size: Normal)   Pulse 68   Temp 98.5 F (36.9 C) (Oral)   Ht '5\' 10"'$  (1.778 m)   Wt 179 lb 9.6 oz (81.5 kg)   SpO2 97%   BMI 25.77 kg/m   GEN: A/Ox3; pleasant , NAD, well nourished    HEENT:  [place]/AT,   NOSE-clear, THROAT-clear, no lesions, no postnasal drip or exudate noted.   NECK:  Supple w/ fair ROM; no JVD; normal carotid impulses w/o bruits; no thyromegaly or nodules palpated; no lymphadenopathy.    RESP  Clear  P & A; w/o, wheezes/ rales/ or rhonchi. no accessory muscle use, no dullness to  percussion  CARD:  RRR, no m/r/g, tr  peripheral edema, pulses intact, no cyanosis or clubbing.  GI:   Soft & nt; nml bowel sounds; no organomegaly or masses detected.   Musco: Warm bil, no deformities or joint swelling noted.   Neuro: alert, no focal deficits noted.    Skin: Warm, no lesions or rashes    Lab Results:    BNP No results found for: BNP  ProBNP No results found for: PROBNP  Imaging: CT Chest High Resolution  Result Date: 07/31/2021  CLINICAL DATA:  Chronic dyspnea on exertion since January. Former smoker. EXAM: CT CHEST WITHOUT CONTRAST TECHNIQUE: Multidetector CT imaging of the chest was performed following the standard protocol without intravenous contrast. High resolution imaging of the lungs, as well as inspiratory and expiratory imaging, was performed. COMPARISON:  06/23/2021 chest radiograph. FINDINGS: Cardiovascular: Normal heart size. No significant pericardial effusion/thickening. Left anterior descending coronary atherosclerosis. Atherosclerotic nonaneurysmal thoracic aorta. Top-normal caliber main pulmonary artery (3.3 cm diameter). Mediastinum/Nodes: Hypodense bilateral thyroid nodules, largest 1.3 cm on the right. Not clinically significant; no follow-up imaging recommended (ref: J Am Coll Radiol. 2015 Feb;12(2): 143-50). Unremarkable esophagus. No pathologically enlarged axillary, mediastinal or hilar lymph nodes, noting limited sensitivity for the detection of hilar adenopathy on this noncontrast study. Lungs/Pleura: No pneumothorax. No pleural effusion. Mild centrilobular emphysema. No acute consolidative airspace disease, lung masses or significant pulmonary nodules. Generalized mild cylindrical bronchiolectasis in the basilar lower lobes bilaterally, left greater than right, and in the medial right middle lobe and inferior lingula. No significant regions of subpleural reticulation, ground-glass attenuation, architectural distortion or frank honeycombing. No  significant lobular air trapping or evidence of tracheobronchomalacia on the expiration sequence. Tiny calcified peripheral right upper lobe granuloma. Upper abdomen: Mild diffuse intrahepatic biliary ductal dilatation. Left adrenal 2.2 cm nodule with density -12 HU, compatible with an adenoma. Subcentimeter angiomyolipoma in the posteromedial upper left kidney. Colonic diverticulosis. Musculoskeletal: No aggressive appearing focal osseous lesions. Moderate thoracic spondylosis. IMPRESSION: 1. No evidence of interstitial lung disease. 2. Mild cylindrical bronchiolectasis in the mid to lower lungs bilaterally, left greater than right. 3. One vessel coronary atherosclerosis. 4. Mild diffuse intrahepatic biliary ductal dilatation, presumably due to chronic post cholecystectomy effect. Recommend correlation with serum bilirubin levels. 5. Left adrenal adenoma. 6. Colonic diverticulosis. 7. Aortic Atherosclerosis (ICD10-I70.0) and Emphysema (ICD10-J43.9). Electronically Signed   By: Ilona Sorrel M.D.   On: 07/31/2021 10:37      PFT Results Latest Ref Rng & Units 08/03/2021  FVC-Pre L 2.74  FVC-Predicted Pre % 101  Pre FEV1/FVC % % 73  FEV1-Pre L 2.01  FEV1-Predicted Pre % 95  DLCO uncorrected ml/min/mmHg 16.82  DLCO UNC% % 73  DLCO corrected ml/min/mmHg 16.52  DLCO COR %Predicted % 72  DLVA Predicted % 87  TLC L 5.05  TLC % Predicted % 84  RV % Predicted % 86    No results found for: NITRICOXIDE      Assessment & Plan:   Emphysema lung (HCC) Mild emphysema on CT.  Patient is encouraged on smoking cessation. Despite long history of smoking.  Patient has no significant airflow obstruction or restriction on PFTs.  Suspect her shortness of breath has some relation to her underlying mild emphysema age and deconditioning.  2D echo is pending. Recommend Spiriva however patient declines.  We will give her albuterol to use as needed.  Activity as tolerated  Plan  Patient Instructions  Albuterol  inhaler 1-2 puffs every 6hr as needed for shortness of breath.  2 D echo as planned later this month  Mucinex DM Twice daily  As needed  cough/congestion  Work on not smoking .  Activity as tolerated.  Follow up with Dr. Chase Caller in 4 months and As needed       Tobacco abuse Smoking cessation discussed  Dyspnea Dyspnea suspect is multifactorial with underlying mild emphysema, age, deconditioning.  2D echo is pending we will follow those results. She does have some mild emphysema which can be contributing to her dyspnea.  Recommend Spiriva  however she declines.  May use albuterol as needed.  Activity as tolerated.     Rexene Edison, NP 08/03/2021

## 2021-08-03 NOTE — Assessment & Plan Note (Signed)
Dyspnea suspect is multifactorial with underlying mild emphysema, age, deconditioning.  2D echo is pending we will follow those results. She does have some mild emphysema which can be contributing to her dyspnea.  Recommend Spiriva however she declines.  May use albuterol as needed.  Activity as tolerated.

## 2021-08-03 NOTE — Progress Notes (Signed)
PFT done today. 

## 2021-08-21 ENCOUNTER — Ambulatory Visit (HOSPITAL_COMMUNITY): Payer: Medicare Other | Attending: Cardiology

## 2021-08-21 ENCOUNTER — Other Ambulatory Visit: Payer: Self-pay

## 2021-08-21 DIAGNOSIS — R0609 Other forms of dyspnea: Secondary | ICD-10-CM

## 2021-08-21 DIAGNOSIS — R0600 Dyspnea, unspecified: Secondary | ICD-10-CM | POA: Diagnosis present

## 2021-08-21 LAB — ECHOCARDIOGRAM COMPLETE
Area-P 1/2: 2.71 cm2
S' Lateral: 1.8 cm

## 2021-08-31 ENCOUNTER — Inpatient Hospital Stay (HOSPITAL_COMMUNITY): Payer: Medicare Other | Admitting: Anesthesiology

## 2021-08-31 ENCOUNTER — Encounter (HOSPITAL_COMMUNITY): Payer: Self-pay | Admitting: *Deleted

## 2021-08-31 ENCOUNTER — Emergency Department (HOSPITAL_COMMUNITY): Payer: Medicare Other

## 2021-08-31 ENCOUNTER — Encounter (HOSPITAL_COMMUNITY)
Admission: EM | Disposition: A | Discharge status: Home / Self Care | Payer: Self-pay | Source: Home / Self Care | Attending: Internal Medicine

## 2021-08-31 ENCOUNTER — Other Ambulatory Visit: Payer: Self-pay

## 2021-08-31 ENCOUNTER — Inpatient Hospital Stay (HOSPITAL_COMMUNITY)
Admission: EM | Admit: 2021-08-31 | Discharge: 2021-09-04 | DRG: 354 | Disposition: A | Discharge status: Home / Self Care | Payer: Medicare Other | Attending: Internal Medicine | Admitting: Internal Medicine

## 2021-08-31 DIAGNOSIS — I739 Peripheral vascular disease, unspecified: Secondary | ICD-10-CM | POA: Diagnosis present

## 2021-08-31 DIAGNOSIS — Z803 Family history of malignant neoplasm of breast: Secondary | ICD-10-CM | POA: Diagnosis not present

## 2021-08-31 DIAGNOSIS — J439 Emphysema, unspecified: Secondary | ICD-10-CM | POA: Diagnosis present

## 2021-08-31 DIAGNOSIS — K56609 Unspecified intestinal obstruction, unspecified as to partial versus complete obstruction: Secondary | ICD-10-CM | POA: Diagnosis present

## 2021-08-31 DIAGNOSIS — K436 Other and unspecified ventral hernia with obstruction, without gangrene: Secondary | ICD-10-CM

## 2021-08-31 DIAGNOSIS — Z8249 Family history of ischemic heart disease and other diseases of the circulatory system: Secondary | ICD-10-CM | POA: Diagnosis not present

## 2021-08-31 DIAGNOSIS — R109 Unspecified abdominal pain: Secondary | ICD-10-CM

## 2021-08-31 DIAGNOSIS — F17210 Nicotine dependence, cigarettes, uncomplicated: Secondary | ICD-10-CM | POA: Diagnosis present

## 2021-08-31 DIAGNOSIS — E871 Hypo-osmolality and hyponatremia: Secondary | ICD-10-CM | POA: Diagnosis present

## 2021-08-31 DIAGNOSIS — R1084 Generalized abdominal pain: Secondary | ICD-10-CM | POA: Diagnosis not present

## 2021-08-31 DIAGNOSIS — Z91013 Allergy to seafood: Secondary | ICD-10-CM | POA: Diagnosis not present

## 2021-08-31 DIAGNOSIS — E869 Volume depletion, unspecified: Secondary | ICD-10-CM | POA: Diagnosis present

## 2021-08-31 DIAGNOSIS — I10 Essential (primary) hypertension: Secondary | ICD-10-CM | POA: Diagnosis present

## 2021-08-31 DIAGNOSIS — R9431 Abnormal electrocardiogram [ECG] [EKG]: Secondary | ICD-10-CM | POA: Diagnosis present

## 2021-08-31 DIAGNOSIS — J438 Other emphysema: Secondary | ICD-10-CM | POA: Diagnosis not present

## 2021-08-31 DIAGNOSIS — E876 Hypokalemia: Secondary | ICD-10-CM | POA: Diagnosis present

## 2021-08-31 DIAGNOSIS — D72829 Elevated white blood cell count, unspecified: Secondary | ICD-10-CM | POA: Diagnosis present

## 2021-08-31 DIAGNOSIS — Z20822 Contact with and (suspected) exposure to covid-19: Secondary | ICD-10-CM | POA: Diagnosis present

## 2021-08-31 DIAGNOSIS — K460 Unspecified abdominal hernia with obstruction, without gangrene: Secondary | ICD-10-CM | POA: Diagnosis not present

## 2021-08-31 DIAGNOSIS — Z9049 Acquired absence of other specified parts of digestive tract: Secondary | ICD-10-CM

## 2021-08-31 DIAGNOSIS — Z882 Allergy status to sulfonamides status: Secondary | ICD-10-CM | POA: Diagnosis not present

## 2021-08-31 DIAGNOSIS — Z4659 Encounter for fitting and adjustment of other gastrointestinal appliance and device: Secondary | ICD-10-CM

## 2021-08-31 DIAGNOSIS — K430 Incisional hernia with obstruction, without gangrene: Secondary | ICD-10-CM | POA: Diagnosis present

## 2021-08-31 HISTORY — PX: INGUINAL HERNIA REPAIR: SHX194

## 2021-08-31 HISTORY — PX: LAPAROTOMY: SHX154

## 2021-08-31 LAB — CBC WITH DIFFERENTIAL/PLATELET
Abs Immature Granulocytes: 0.04 10*3/uL (ref 0.00–0.07)
Basophils Absolute: 0 10*3/uL (ref 0.0–0.1)
Basophils Relative: 0 %
Eosinophils Absolute: 0 10*3/uL (ref 0.0–0.5)
Eosinophils Relative: 0 %
HCT: 49 % — ABNORMAL HIGH (ref 36.0–46.0)
Hemoglobin: 15.8 g/dL — ABNORMAL HIGH (ref 12.0–15.0)
Immature Granulocytes: 0 %
Lymphocytes Relative: 10 %
Lymphs Abs: 1.3 10*3/uL (ref 0.7–4.0)
MCH: 29.9 pg (ref 26.0–34.0)
MCHC: 32.2 g/dL (ref 30.0–36.0)
MCV: 92.6 fL (ref 80.0–100.0)
Monocytes Absolute: 0.8 10*3/uL (ref 0.1–1.0)
Monocytes Relative: 6 %
Neutro Abs: 10.6 10*3/uL — ABNORMAL HIGH (ref 1.7–7.7)
Neutrophils Relative %: 84 %
Platelets: 420 10*3/uL — ABNORMAL HIGH (ref 150–400)
RBC: 5.29 MIL/uL — ABNORMAL HIGH (ref 3.87–5.11)
RDW: 14.7 % (ref 11.5–15.5)
WBC: 12.7 10*3/uL — ABNORMAL HIGH (ref 4.0–10.5)
nRBC: 0 % (ref 0.0–0.2)

## 2021-08-31 LAB — COMPREHENSIVE METABOLIC PANEL
ALT: 12 U/L (ref 0–44)
AST: 19 U/L (ref 15–41)
Albumin: 3.8 g/dL (ref 3.5–5.0)
Alkaline Phosphatase: 69 U/L (ref 38–126)
Anion gap: 9 (ref 5–15)
BUN: 11 mg/dL (ref 8–23)
CO2: 28 mmol/L (ref 22–32)
Calcium: 10.3 mg/dL (ref 8.9–10.3)
Chloride: 97 mmol/L — ABNORMAL LOW (ref 98–111)
Creatinine, Ser: 0.89 mg/dL (ref 0.44–1.00)
GFR, Estimated: >60 mL/min (ref >60)
Glucose, Bld: 133 mg/dL — ABNORMAL HIGH (ref 70–99)
Potassium: 3.8 mmol/L (ref 3.5–5.1)
Sodium: 134 mmol/L — ABNORMAL LOW (ref 135–145)
Total Bilirubin: 1.4 mg/dL — ABNORMAL HIGH (ref 0.3–1.2)
Total Protein: 6.7 g/dL (ref 6.5–8.1)

## 2021-08-31 LAB — RESP PANEL BY RT-PCR (FLU A&B, COVID) ARPGX2
Influenza A by PCR: NEGATIVE
Influenza B by PCR: NEGATIVE
SARS Coronavirus 2 by RT PCR: NEGATIVE

## 2021-08-31 LAB — LIPASE, BLOOD: Lipase: 34 U/L (ref 11–51)

## 2021-08-31 LAB — TROPONIN I (HIGH SENSITIVITY)
Troponin I (High Sensitivity): 7 ng/L (ref <18)
Troponin I (High Sensitivity): 11 ng/L (ref <18)

## 2021-08-31 SURGERY — LAPAROTOMY, EXPLORATORY
Anesthesia: General

## 2021-08-31 MED ORDER — ONDANSETRON HCL 4 MG/2ML IJ SOLN: 4.0000 mg | Freq: Once | INTRAMUSCULAR | Status: DC | PRN

## 2021-08-31 MED ORDER — PHENOL 1.4 % MT LIQD
1.0000 | OROMUCOSAL | Status: DC | PRN
  Administered 2021-09-01: 1 via OROMUCOSAL
  Filled 2021-08-31: qty 177

## 2021-08-31 MED ORDER — SODIUM CHLORIDE 0.9 % IV BOLUS
1000.0000 mL | Freq: Once | INTRAVENOUS | Status: AC
  Administered 2021-08-31: 1000 mL via INTRAVENOUS

## 2021-08-31 MED ORDER — DICLOFENAC SODIUM 1 % EX GEL: 4.0000 g | Freq: Two times a day (BID) | CUTANEOUS | Status: DC | PRN

## 2021-08-31 MED ORDER — SUCCINYLCHOLINE CHLORIDE 200 MG/10ML IV SOSY
PREFILLED_SYRINGE | INTRAVENOUS | Status: DC | PRN
  Administered 2021-08-31: 120 mg via INTRAVENOUS

## 2021-08-31 MED ORDER — HYDRALAZINE HCL 20 MG/ML IJ SOLN
10.0000 mg | Freq: Four times a day (QID) | INTRAMUSCULAR | Status: DC | PRN
  Administered 2021-08-31 – 2021-09-03 (×3): 10 mg via INTRAVENOUS
  Filled 2021-08-31 (×2): qty 1

## 2021-08-31 MED ORDER — ONDANSETRON HCL 4 MG/2ML IJ SOLN
4.0000 mg | Freq: Four times a day (QID) | INTRAMUSCULAR | Status: DC | PRN
  Administered 2021-09-01: 4 mg via INTRAVENOUS
  Filled 2021-08-31 (×2): qty 2

## 2021-08-31 MED ORDER — HYDROMORPHONE HCL 1 MG/ML IJ SOLN
0.5000 mg | Freq: Once | INTRAMUSCULAR | Status: AC
  Administered 2021-08-31: 0.5 mg via INTRAVENOUS
  Filled 2021-08-31: qty 1

## 2021-08-31 MED ORDER — BACITRACIN-NEOMYCIN-POLYMYXIN 400-5-5000 EX OINT
TOPICAL_OINTMENT | CUTANEOUS | Status: DC | PRN
  Administered 2021-08-31: 1 via TOPICAL

## 2021-08-31 MED ORDER — FENTANYL CITRATE (PF) 100 MCG/2ML IJ SOLN
INTRAMUSCULAR | Status: DC | PRN
  Administered 2021-08-31 (×4): 50 ug via INTRAVENOUS

## 2021-08-31 MED ORDER — LACTATED RINGERS IV SOLN: INTRAVENOUS | Status: DC | PRN

## 2021-08-31 MED ORDER — LIDOCAINE 2% (20 MG/ML) 5 ML SYRINGE
INTRAMUSCULAR | Status: DC | PRN
  Administered 2021-08-31: 60 mg via INTRAVENOUS

## 2021-08-31 MED ORDER — FENTANYL CITRATE PF 50 MCG/ML IJ SOSY
25.0000 ug | PREFILLED_SYRINGE | INTRAMUSCULAR | Status: DC | PRN
  Administered 2021-08-31: 50 ug via INTRAVENOUS

## 2021-08-31 MED ORDER — IOHEXOL 350 MG/ML SOLN
80.0000 mL | Freq: Once | INTRAVENOUS | Status: AC | PRN
  Administered 2021-08-31: 80 mL via INTRAVENOUS

## 2021-08-31 MED ORDER — PROPOFOL 10 MG/ML IV BOLUS
INTRAVENOUS | Status: DC | PRN
  Administered 2021-08-31: 120 mg via INTRAVENOUS

## 2021-08-31 MED ORDER — 0.9 % SODIUM CHLORIDE (POUR BTL) OPTIME
TOPICAL | Status: DC | PRN
  Administered 2021-08-31: 1000 mL

## 2021-08-31 MED ORDER — HYDROMORPHONE HCL 1 MG/ML IJ SOLN
1.0000 mg | INTRAMUSCULAR | Status: DC | PRN
  Administered 2021-08-31: 1 mg via INTRAVENOUS

## 2021-08-31 MED ORDER — FENTANYL CITRATE PF 50 MCG/ML IJ SOSY
PREFILLED_SYRINGE | INTRAMUSCULAR | Status: AC
  Filled 2021-08-31: qty 1

## 2021-08-31 MED ORDER — CIPROFLOXACIN IN D5W 400 MG/200ML IV SOLN
INTRAVENOUS | Status: AC
  Filled 2021-08-31: qty 200

## 2021-08-31 MED ORDER — PHENYLEPHRINE 40 MCG/ML (10ML) SYRINGE FOR IV PUSH (FOR BLOOD PRESSURE SUPPORT)
PREFILLED_SYRINGE | INTRAVENOUS | Status: AC
  Filled 2021-08-31: qty 30

## 2021-08-31 MED ORDER — BUPIVACAINE LIPOSOME 1.3 % IJ SUSP
INTRAMUSCULAR | Status: DC | PRN
Start: 2021-08-31 — End: 2021-08-31
  Administered 2021-08-31: 10 mL

## 2021-08-31 MED ORDER — ONDANSETRON HCL 4 MG/2ML IJ SOLN
INTRAMUSCULAR | Status: DC | PRN
  Administered 2021-08-31: 4 mg via INTRAVENOUS

## 2021-08-31 MED ORDER — DEXAMETHASONE SODIUM PHOSPHATE 10 MG/ML IJ SOLN
INTRAMUSCULAR | Status: DC | PRN
  Administered 2021-08-31: 5 mg via INTRAVENOUS

## 2021-08-31 MED ORDER — BACITRACIN-NEOMYCIN-POLYMYXIN OINTMENT TUBE
TOPICAL_OINTMENT | CUTANEOUS | Status: AC
  Filled 2021-08-31: qty 14.17

## 2021-08-31 MED ORDER — SUGAMMADEX SODIUM 200 MG/2ML IV SOLN
INTRAVENOUS | Status: DC | PRN
  Administered 2021-08-31: 200 mg via INTRAVENOUS

## 2021-08-31 MED ORDER — CIPROFLOXACIN IN D5W 400 MG/200ML IV SOLN
INTRAVENOUS | Status: DC | PRN
  Administered 2021-08-31: 400 mg via INTRAVENOUS

## 2021-08-31 MED ORDER — METRONIDAZOLE 500 MG/100ML IV SOLN
INTRAVENOUS | Status: AC
  Filled 2021-08-31: qty 100

## 2021-08-31 MED ORDER — ENOXAPARIN SODIUM 40 MG/0.4ML IJ SOSY: 40.0000 mg | PREFILLED_SYRINGE | INTRAMUSCULAR | Status: DC

## 2021-08-31 MED ORDER — FENTANYL CITRATE PF 50 MCG/ML IJ SOSY
PREFILLED_SYRINGE | INTRAMUSCULAR | Status: AC
  Administered 2021-08-31: 50 ug via INTRAVENOUS
  Filled 2021-08-31: qty 1

## 2021-08-31 MED ORDER — PHENYLEPHRINE 40 MCG/ML (10ML) SYRINGE FOR IV PUSH (FOR BLOOD PRESSURE SUPPORT)
PREFILLED_SYRINGE | INTRAVENOUS | Status: DC | PRN
  Administered 2021-08-31: 80 ug via INTRAVENOUS

## 2021-08-31 MED ORDER — BUPIVACAINE LIPOSOME 1.3 % IJ SUSP
INTRAMUSCULAR | Status: AC
  Filled 2021-08-31: qty 10

## 2021-08-31 MED ORDER — HYDROMORPHONE HCL 1 MG/ML IJ SOLN
0.5000 mg | INTRAMUSCULAR | Status: DC | PRN
  Administered 2021-09-01 – 2021-09-03 (×5): 1 mg via INTRAVENOUS
  Filled 2021-08-31 (×7): qty 1

## 2021-08-31 MED ORDER — SODIUM CHLORIDE 0.9 % IV SOLN: INTRAVENOUS | Status: DC

## 2021-08-31 MED ORDER — ROCURONIUM BROMIDE 10 MG/ML (PF) SYRINGE
PREFILLED_SYRINGE | INTRAVENOUS | Status: DC | PRN
  Administered 2021-08-31: 50 mg via INTRAVENOUS

## 2021-08-31 MED ORDER — METRONIDAZOLE 500 MG/100ML IV SOLN
INTRAVENOUS | Status: DC | PRN
  Administered 2021-08-31: 500 mg via INTRAVENOUS

## 2021-08-31 MED ORDER — ONDANSETRON HCL 4 MG PO TABS: 4.0000 mg | ORAL_TABLET | Freq: Four times a day (QID) | ORAL | Status: DC | PRN

## 2021-08-31 MED ORDER — FENTANYL CITRATE (PF) 250 MCG/5ML IJ SOLN
INTRAMUSCULAR | Status: AC
  Filled 2021-08-31: qty 5

## 2021-08-31 MED ORDER — ALBUTEROL SULFATE (2.5 MG/3ML) 0.083% IN NEBU: 3.0000 mL | INHALATION_SOLUTION | Freq: Four times a day (QID) | RESPIRATORY_TRACT | Status: DC | PRN

## 2021-08-31 MED ORDER — HYDRALAZINE HCL 20 MG/ML IJ SOLN
INTRAMUSCULAR | Status: AC
  Filled 2021-08-31: qty 1

## 2021-08-31 MED ORDER — ONDANSETRON HCL 4 MG/2ML IJ SOLN
4.0000 mg | Freq: Once | INTRAMUSCULAR | Status: AC
  Administered 2021-08-31: 4 mg via INTRAVENOUS
  Filled 2021-08-31: qty 2

## 2021-08-31 MED ORDER — ENOXAPARIN SODIUM 40 MG/0.4ML IJ SOSY
40.0000 mg | PREFILLED_SYRINGE | INTRAMUSCULAR | Status: DC
  Administered 2021-09-01 – 2021-09-04 (×4): 40 mg via SUBCUTANEOUS
  Filled 2021-08-31 (×4): qty 0.4

## 2021-08-31 MED ORDER — FENTANYL CITRATE (PF) 100 MCG/2ML IJ SOLN
INTRAMUSCULAR | Status: AC
  Filled 2021-08-31: qty 2

## 2021-08-31 MED ORDER — PROPOFOL 10 MG/ML IV BOLUS
INTRAVENOUS | Status: AC
  Filled 2021-08-31: qty 20

## 2021-08-31 MED ORDER — SODIUM CHLORIDE (PF) 0.9 % IJ SOLN
INTRAMUSCULAR | Status: AC
  Filled 2021-08-31: qty 20

## 2021-08-31 SURGICAL SUPPLY — 41 items
APPLICATOR COTTON TIP 6 STRL (MISCELLANEOUS) ×1 IMPLANT
APPLICATOR COTTON TIP 6IN STRL (MISCELLANEOUS) ×2 IMPLANT
BAG COUNTER SPONGE SURGICOUNT (BAG) IMPLANT
BINDER ABDOMINAL 12 ML 46-62 (SOFTGOODS) ×2 IMPLANT
BLADE EXTENDED COATED 6.5IN (ELECTRODE) IMPLANT
BLADE HEX COATED 2.75 (ELECTRODE) ×2 IMPLANT
BNDG ADH 1X3 SHEER STRL LF (GAUZE/BANDAGES/DRESSINGS) ×2 IMPLANT
COVER MAYO STAND STRL (DRAPES) IMPLANT
DRAPE LAPAROSCOPIC ABDOMINAL (DRAPES) ×2 IMPLANT
DRAPE WARM FLUID 44X44 (DRAPES) IMPLANT
DRSG OPSITE POSTOP 4X8 (GAUZE/BANDAGES/DRESSINGS) ×2 IMPLANT
DRSG TEGADERM 4X4.75 (GAUZE/BANDAGES/DRESSINGS) ×2 IMPLANT
ELECT REM PT RETURN 15FT ADLT (MISCELLANEOUS) ×2 IMPLANT
GAUZE SPONGE 4X4 12PLY STRL (GAUZE/BANDAGES/DRESSINGS) ×2 IMPLANT
GLOVE SURG NEOPR MICRO LF SZ8 (GLOVE) ×2 IMPLANT
GLOVE SURG UNDER LTX SZ8 (GLOVE) ×4 IMPLANT
GLOVE SURG UNDER POLY LF SZ7 (GLOVE) ×2 IMPLANT
GOWN STRL REUS W/TWL LRG LVL3 (GOWN DISPOSABLE) ×2 IMPLANT
GOWN STRL REUS W/TWL XL LVL3 (GOWN DISPOSABLE) ×4 IMPLANT
HANDLE SUCTION POOLE (INSTRUMENTS) IMPLANT
KIT BASIN OR (CUSTOM PROCEDURE TRAY) ×2 IMPLANT
KIT TURNOVER KIT A (KITS) ×2 IMPLANT
NS IRRIG 1000ML POUR BTL (IV SOLUTION) ×2 IMPLANT
PACK GENERAL/GYN (CUSTOM PROCEDURE TRAY) ×2 IMPLANT
SPONGE GAUZE 2X2 8PLY STRL LF (GAUZE/BANDAGES/DRESSINGS) ×2 IMPLANT
SPONGE T-LAP 18X18 ~~LOC~~+RFID (SPONGE) ×4 IMPLANT
STAPLER VISISTAT 35W (STAPLE) ×2 IMPLANT
SUCTION POOLE HANDLE (INSTRUMENTS)
SUT NOVA 1 T20/GS 25DT (SUTURE) ×4 IMPLANT
SUT PDS 1 0 (SUTURE) ×2 IMPLANT
SUT PDS AB 1 CTX 36 (SUTURE) IMPLANT
SUT SILK 2 0 (SUTURE) ×2
SUT SILK 2-0 18XBRD TIE 12 (SUTURE) ×1 IMPLANT
SUT SILK 3 0 (SUTURE) ×2
SUT SILK 3 0 SH CR/8 (SUTURE) ×2 IMPLANT
SUT SILK 3-0 18XBRD TIE 12 (SUTURE) ×1 IMPLANT
SUT VIC AB 2-0 SH 18 (SUTURE) ×2 IMPLANT
SUT VIC AB 3-0 SH 18 (SUTURE) IMPLANT
TOWEL OR 17X26 10 PK STRL BLUE (TOWEL DISPOSABLE) ×4 IMPLANT
TRAY FOLEY MTR SLVR 16FR STAT (SET/KITS/TRAYS/PACK) IMPLANT
YANKAUER SUCT BULB TIP NO VENT (SUCTIONS) IMPLANT

## 2021-08-31 NOTE — Anesthesia Procedure Notes (Signed)
Procedure Name: Intubation Date/Time: 08/31/2021 8:59 PM Performed by: Gean Maidens, CRNA Pre-anesthesia Checklist: Patient identified, Emergency Drugs available, Suction available, Patient being monitored and Timeout performed Patient Re-evaluated:Patient Re-evaluated prior to induction Oxygen Delivery Method: Circle system utilized Preoxygenation: Pre-oxygenation with 100% oxygen Induction Type: IV induction and Rapid sequence Laryngoscope Size: Mac and 4 Grade View: Grade II Tube type: Oral Tube size: 7.0 mm Number of attempts: 1 Airway Equipment and Method: Stylet Placement Confirmation: ETT inserted through vocal cords under direct vision, positive ETCO2 and breath sounds checked- equal and bilateral Secured at: 22 cm Tube secured with: Tape Dental Injury: Teeth and Oropharynx as per pre-operative assessment

## 2021-08-31 NOTE — ED Provider Notes (Signed)
Emergency Medicine Provider Triage Evaluation Note  Christina Branch , a 80 y.o. female  was evaluated in triage.  Pt complains of abdominal pain, distention, nausea/vomiting.  Symptoms started yesterday and have been worsening.  States that her distention will improve when she has episodes of vomiting.  States she feels as if she wants to have a bowel movement but cannot.  Denies any hematemesis and notes that her vomit has been "dark gray/charcoal".  Reports a history of multiple abdominal surgeries in the past.  Physical Exam  BP (!) 168/103 (BP Location: Left Arm)   Pulse 80   Temp 98.9 F (37.2 C) (Oral)   Resp 18   SpO2 96%  Gen:   Awake, no distress   Resp:  Normal effort  MSK:   Moves extremities without difficulty  Other:    Medical Decision Making  Medically screening exam initiated at 1:14 PM.  Appropriate orders placed.  Christina Branch was informed that the remainder of the evaluation will be completed by another provider, this initial triage assessment does not replace that evaluation, and the importance of remaining in the ED until their evaluation is complete.   Christina Sexton, PA-C 08/31/21 1315    Christina Branch 09/04/21 [phone]

## 2021-08-31 NOTE — Anesthesia Preprocedure Evaluation (Addendum)
Anesthesia Evaluation  Patient identified by MRN, date of birth, ID band Patient awake    Reviewed: Allergy & Precautions, NPO status , Patient's Chart, lab work & pertinent test results, reviewed documented beta blocker date and time   Airway Mallampati: II  TM Distance: >3 FB Neck ROM: Full    Dental  (+) Dental Advisory Given   Pulmonary shortness of breath and with exertion, COPD,  COPD inhaler, Current Smoker and Patient abstained from smoking.,    Pulmonary exam normal breath sounds clear to auscultation       Cardiovascular hypertension, Pt. 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 Hx/o myalgias and arthralgias   Abdominal   Peds  Hematology negative hematology ROS (+)   Anesthesia Other Findings   Reproductive/Obstetrics                             Anesthesia Physical Anesthesia Plan  ASA: 3 and emergent  Anesthesia Plan: General   Post-op Pain Management:    Induction: Intravenous, Cricoid pressure planned and Rapid sequence  PONV Risk Score and Plan: 4 or greater and Treatment may vary due to age or medical condition and Ondansetron  Airway Management Planned: Oral ETT  Additional Equipment:   Intra-op Plan:   Post-operative Plan: Extubation in OR  Informed Consent: I have reviewed the patients History and Physical, chart, labs and discussed the procedure including the risks, benefits and alternatives for the proposed anesthesia with the patient or  authorized representative who has indicated his/her understanding and acceptance.     Dental advisory given  Plan Discussed with: CRNA and Anesthesiologist  Anesthesia Plan Comments:       Anesthesia Quick Evaluation

## 2021-08-31 NOTE — Consult Note (Signed)
Reason for Consult: Incarcerated incisional hernia Referring Physician: Brithany Whitworth is an 80 y.o. female.  HPI: Longitudinally pleasant 80 year old female with 2-day history of lower abdominal pain nausea and vomiting.  Her pain is located in the right lower quadrant.  CT scan showed an incarcerated possibly strangulated incisional hernia.  She has a history of partial colectomy in the past.  Her pain started 2 days ago.  Location lower abdomen.  It is a 5-6 out of 10.  Attempts at reducing hernia were unsuccessful.  Review of CT scan shows small bowel incarcerated in the hernia itself of free fluid and enhancing of the bowel wall.  It is not reducible.  Past Medical History:  Diagnosis Date   Claudication ([place])    DJD (degenerative joint disease) of knee    GERD (gastroesophageal reflux disease)    H/O: varicose veins    Right Leg   Hyperlipidemia    Hypertension    Hypoxemia    Myalgia and myositis, unspecified    Pain in limb    Peripheral neuropathy 03/07/2020   Varicose veins     Past Surgical History:  Procedure Laterality Date   CHOLECYSTECTOMY     Open cholecystectomy   COLON SURGERY     partial resection due to large polyps (Benign)   FRACTURE SURGERY  1953   ORIF right hip   HEMORRHOID SURGERY      Family History  Problem Relation Age of Onset   Cancer Mother    Breast cancer Sister    Hypertension Son     Social History:  reports that she has been smoking cigarettes. She started smoking about 58 years ago. She has a 20.00 pack-year smoking history. She has never used smokeless tobacco. She reports that she does not drink alcohol and does not use drugs.  Allergies:  Allergies  Allergen Reactions   Penicillins Hives   Shellfish Allergy Swelling   Sulfa Antibiotics Nausea And Vomiting   Tuna [Fish Allergy] Swelling    Medications: I have reviewed the patient's current medications.  Results for orders placed or performed during the hospital  encounter of 08/31/21 (from the past 48 hour(s))  Comprehensive metabolic panel     Status: Abnormal   Collection Time: 08/31/21  2:14 PM  Result Value Ref Range   Sodium 134 (L) 135 - 145 mmol/L   Potassium 3.8 3.5 - 5.1 mmol/L   Chloride 97 (L) 98 - 111 mmol/L   CO2 28 22 - 32 mmol/L   Glucose, Bld 133 (H) 70 - 99 mg/dL    Comment: Glucose reference range applies only to samples taken after fasting for at least 8 hours.   BUN 11 8 - 23 mg/dL   Creatinine, Ser 0.89 0.44 - 1.00 mg/dL   Calcium 10.3 8.9 - 10.3 mg/dL   Total Protein 6.7 6.5 - 8.1 g/dL   Albumin 3.8 3.5 - 5.0 g/dL   AST 19 15 - 41 U/L   ALT 12 0 - 44 U/L   Alkaline Phosphatase 69 38 - 126 U/L   Total Bilirubin 1.4 (H) 0.3 - 1.2 mg/dL   GFR, Estimated >60 >60 mL/min    Comment: (NOTE) Calculated using the CKD-EPI Creatinine Equation (2021)    Anion gap 9 5 - 15    Comment: Performed at [facility], [place] [address] [place], [place] [address]  CBC with Differential     Status: Abnormal   Collection Time: 08/31/21  2:14 PM  Result Value Ref Range   WBC 12.7 (H) 4.0 - 10.5 K/uL   RBC 5.29 (H) 3.87 - 5.11 MIL/uL   Hemoglobin 15.8 (H) 12.0 - 15.0 g/dL   HCT 49.0 (H) 36.0 - 46.0 %   MCV 92.6 80.0 - 100.0 fL   MCH 29.9 26.0 - 34.0 pg   MCHC 32.2 30.0 - 36.0 g/dL   RDW 14.7 11.5 - 15.5 %   Platelets 420 (H) 150 - 400 K/uL   nRBC 0.0 0.0 - 0.2 %   Neutrophils Relative % 84 %   Neutro Abs 10.6 (H) 1.7 - 7.7 K/uL   Lymphocytes Relative 10 %   Lymphs Abs 1.3 0.7 - 4.0 K/uL   Monocytes Relative 6 %   Monocytes Absolute 0.8 0.1 - 1.0 K/uL   Eosinophils Relative 0 %   Eosinophils Absolute 0.0 0.0 - 0.5 K/uL   Basophils Relative 0 %   Basophils Absolute 0.0 0.0 - 0.1 K/uL   Immature Granulocytes 0 %   Abs Immature Granulocytes 0.04 0.00 - 0.07 K/uL    Comment: Performed at [facility], [place] [address], [place], [place] [address]  Lipase, blood     Status: None   Collection Time:  08/31/21  2:14 PM  Result Value Ref Range   Lipase 34 11 - 51 U/L    Comment: Performed at [facility], [place] [address], [place], [place] [address]  Troponin I (High Sensitivity)     Status: None   Collection Time: 08/31/21  5:13 PM  Result Value Ref Range   Troponin I (High Sensitivity) 7 <18 ng/L    Comment: (NOTE) Elevated high sensitivity troponin I (hsTnI) values and significant  changes across serial measurements may suggest ACS but many other  chronic and acute conditions are known to elevate hsTnI results.  Refer to the "Links" section for chest pain algorithms and additional  guidance. Performed at [facility], [place] [address], [place], [place] [address]     CT Abdomen Pelvis W Contrast  Result Date: 08/31/2021 CLINICAL DATA:  Evaluate for bowel obstruction.  Abdominal pain. EXAM: CT ABDOMEN AND PELVIS WITH CONTRAST TECHNIQUE: Multidetector CT imaging of the abdomen and pelvis was performed using the standard protocol following bolus administration of intravenous contrast. CONTRAST:  80mL OMNIPAQUE IOHEXOL 350 MG/ML SOLN COMPARISON:  CT abdomen and pelvis 08/23/2020. FINDINGS: Lower chest: There is some scarring in the lung bases. Hepatobiliary: Gallbladder surgically absent. There is stable intra and extrahepatic biliary ductal dilatation. No focal liver lesions are identified. Pancreas: Unremarkable. No pancreatic ductal dilatation or surrounding inflammatory changes. Spleen: Normal in size without focal abnormality. 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 Right adrenal gland is within normal limits. There is no hydronephrosis or perinephric fat stranding. There are cortical hypodensities in both kidneys which are too small to characterize, most likely cysts. The bladder is within normal limits. Stomach/Bowel: There is a central lower ventral hernia containing dilated  small bowel loops. This is transition point for small bowel obstruction. Small bowel loops proximal to this level are dilated measuring up to 3.2 cm with associated mesenteric edema. Stomach is moderately distended with air-fluid level. Distal small bowel and colon are decompressed. There is no pneumatosis or free air identified. Colon is nondilated. Appendix is within normal limits. There is colonic diverticulosis without evidence for acute diverticulitis. Vascular/Lymphatic: Aortic atherosclerosis. No enlarged abdominal or pelvic lymph nodes. 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 Adnexa are unremarkable. Other: There is a small amount of free fluid in the ventral hernia and pelvis. Musculoskeletal: There is severe degenerative changes of the right hip. Right hip screws are present. IMPRESSION: 1. Dilated small bowel within lower abdominal ventral hernia with resultant small bowel obstruction. Findings are concerning for strangulated or incarcerated hernia. There is mesenteric edema present which can be seen with vascular compromise. No pneumatosis or free air. 2. Small amount of free fluid in the hernia and pelvis. 3. Cholecystectomy.  Stable biliary ductal dilatation. 4. Colonic diverticulosis. 5.  Aortic Atherosclerosis (ICD10-I70.0). 6. These results were called by telephone at the time of interpretation on 08/31/2021 at 5:07 pm to provider Fredia Sorrow , who verbally acknowledged these results. Electronically Signed   By: Ronney Asters M.D.   On: 08/31/2021 17:07   DG Abd 2 Views  Result Date: 08/31/2021 CLINICAL DATA:  Abdominal pain, nausea, and vomiting. EXAM: ABDOMEN - 2 VIEW COMPARISON:  None. FINDINGS: Scattered air in the stomach, small bowel, and colon. A few dilated loops of small bowel measuring up to 4.3 cm in diameter are noted with multiple air-fluid levels on upright series. The colon is normal in caliber. Mild fecal burden. No free intraperitoneal air on upright  series. Right femoral hardware is unchanged compared to prior exams. 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 Vascular calcifications in pelvis. Multilevel degenerative disc disease in the lumbar spine. IMPRESSION: A few dilated small bowel loops with air-fluid levels are concerning for possible bowel obstruction. Electronically Signed   By: Ileana Roup M.D.   On: 08/31/2021 14:16    Review of Systems  Constitutional:  Positive for fatigue.  HENT: Negative.    Eyes: Negative.   Respiratory: Negative.    Cardiovascular:  Negative for chest pain and leg swelling.  Gastrointestinal:  Positive for abdominal pain, nausea and vomiting.  Endocrine: Negative.   Genitourinary: Negative.   Musculoskeletal: Negative.   Skin: Negative.   Neurological: Negative.   Hematological: Negative.   Psychiatric/Behavioral: Negative.    Blood pressure (!) 164/76, pulse 79, temperature 98.9 F (37.2 C), temperature source Oral, resp. rate (!) 7, SpO2 97 %. Physical Exam Constitutional:      Appearance: She is well-developed.  HENT:     Head: Normocephalic.  Eyes:     Extraocular Movements: Extraocular movements intact.  Cardiovascular:     Rate and Rhythm: Normal rate and regular rhythm.  Pulmonary:     Effort: Pulmonary effort is normal.     Breath sounds: Normal breath sounds.  Abdominal:     General: Bowel sounds are absent. There is distension.     Tenderness: There is abdominal tenderness in the right lower quadrant.     Hernia: A hernia is present.    Musculoskeletal:        General: Normal range of motion.  Skin:    General: Skin is warm.  Neurological:     General: No focal deficit present.     Mental Status: She is alert.  Psychiatric:        Mood and Affect: Mood normal.        Behavior: Behavior normal.    Assessment/Plan: Incarcerated incisional hernia  After reviewing the CT scan discussing with the patient and her daughter  the bedside, recommend emergent surgical repair given concern for strangulation.  Risks and benefits of surgery discussed.  Of note her troponins are being checked  due to mild EKG changes.  Medicine has seen the patient and evaluated her.  This is emergency therefore this needs to proceed emergently.  She is having no chest pain or shortness of breath.  I discussed this as well with the patient and family at bedside.The risk of hernia repair include bleeding,  Infection,   Recurrence of the hernia,  Mesh use, chronic pain,  Organ injury,  Bowel injury,  Bladder injury,   nerve injury with numbness around the incision,  Death,  and worsening of preexisting  medical problems.  The alternatives to surgery have been discussed as well..  Long term expectations of both operative and non operative treatments have been discussed.   The patient agrees to proceed.   Kullen Tomasetti A Keith Felten 08/31/2021, 7:03 PM

## 2021-08-31 NOTE — H&P (Signed)
[facility] History and Physical  [name] BJY:[id number] DOB: Jun 11, 1941 DOA: 08/31/2021  Referring physician: ED  PCP: Vincente Liberty, MD   Patient is coming from: Home  Chief Complaint: Abdominal pain, nausea vomiting  HPI: Christina Branch is a 80 y.o. female with past medical history of GERD, hyperlipidemia, hypertension, peripheral arterial disease presented to hospital with abdominal pain, distention and nausea and vomiting since yesterday which has been worsening in nature.  He noticed a bulge in the right lower quadrant since yesterday and had not noticed it previously.  She feels like he wants to move her bowels but has not been able to do so.  Denied any hematemesis or melena.  Patient does have history of cholecystectomy and partial resection of colon in the past.  Patient denies any fever, chills or rigor.  Denies any urinary urgency, frequency or dysuria.  Denies any chest pain, shortness of breath, cough but had seen Dr. Chase Caller as outpatient for COPD follow-up.  Has albuterol at home.  Denies any wheezing cough runny nose or sore throat.  ED Course: In the ED, patient was noted to have mild leukocytosis.  CT scan of the abdomen showed a reducible abdominal wall hernia  and small bowel loop dilatation.  EKG showed some ST segment elevation inferiorly without chest pain.  General surgery was consulted from the ED who recommended NG tube and medical admission for further evaluation.  Review of Systems:  All systems were reviewed and were negative unless otherwise mentioned in the HPI  Past Medical History:  Diagnosis Date   Claudication ([place])    DJD (degenerative joint disease) of knee    GERD (gastroesophageal reflux disease)    H/O: varicose veins    Right Leg   Hyperlipidemia    Hypertension    Hypoxemia    Myalgia and myositis, unspecified    Pain in limb    Peripheral neuropathy 03/07/2020   Varicose veins    Past Surgical History:  Procedure  Laterality Date   CHOLECYSTECTOMY     Open cholecystectomy   COLON SURGERY     partial resection due to large polyps (Benign)   FRACTURE SURGERY  1953   ORIF right hip   HEMORRHOID SURGERY      Social History:  reports that she has been smoking cigarettes. She started smoking about 58 years ago. She has a 20.00 pack-year smoking history. She has never used smokeless tobacco. She reports that she does not drink alcohol and does not use drugs.  Allergies  Allergen Reactions   Penicillins Hives   Shellfish Allergy Swelling   Sulfa Antibiotics Nausea And Vomiting   Tuna [Fish Allergy] Swelling    Family History  Problem Relation Age of Onset   Cancer Mother    Breast cancer Sister    Hypertension Son      Prior to Admission medications   Medication Sig Start Date End Date Taking? Authorizing Provider  albuterol (VENTOLIN HFA) 108 (90 Base) MCG/ACT inhaler Inhale 1-2 puffs into the lungs every 6 (six) hours as needed. 08/03/21   Parrett, Fonnie Mu, NP  amLODipine (NORVASC) 10 MG tablet Take 10 mg by mouth daily.  03/31/12   [provider]  diclofenac Sodium (VOLTAREN) 1 % GEL APPLY EXTERNALLY TO THE LEFT ANKLE AND LEFT FOOT EVERY 12 HOURS 02/01/20   [provider]  HYDROcodone-ibuprofen (VICOPROFEN) 7.5-200 MG per tablet Take 1 tablet by mouth every 6 (six) hours as needed for moderate pain.  06/03/12  [provider]  loratadine (CLARITIN) 10 MG tablet TK 1 T PO QD. Patient not taking: Reported on 08/03/2021 04/15/17   [provider]  Multiple Vitamins-Minerals (CENTRUM SILVER PO) Take 1 tablet by mouth daily.     [provider]  Vitamin D, Ergocalciferol, (DRISDOL) 50000 units CAPS capsule TK 1 C PO Q WK 04/15/17   [provider]    Physical Exam: Vitals:   08/31/21 1258 08/31/21 1530 08/31/21 1700  BP: (!) 168/103 (!) 145/83 (!) 164/76  Pulse: 80 63 79  Resp: 18 19 (!) 7  Temp: 98.9 F (37.2 C)    TempSrc: Oral    SpO2: 96%  96% 97%   Wt Readings from Last 3 Encounters:  08/03/21 81.5 kg  07/25/21 81.4 kg  03/01/20 84.6 kg   There is no height or weight on file to calculate BMI.  General:  Average built, not in obvious distress HENT: Normocephalic, pupils equally reacting to light and accommodation.  No scleral pallor or icterus noted. Oral mucosa is dry Chest: Diminished breath sounds bilaterally. No crackles or wheezes.  CVS: S1 &S2 heard. No murmur.  Regular rate and rhythm. Abdomen: Distended abdomen with  tenderness right lower quadrant, non reducible hernia noted around 4 x 4 centimeter size Extremities: No cyanosis, clubbing or edema.  Peripheral pulses are palpable. Psych: Alert, awake and oriented, normal mood CNS:  No cranial nerve deficits.  Power equal in all extremities.   No cerebellar signs.   Skin: Warm and dry.  No rashes noted.  Labs on Admission:   CBC: Recent Labs  Lab 08/31/21 1414  WBC 12.7*  NEUTROABS 10.6*  HGB 15.8*  HCT 49.0*  MCV 92.6  PLT 420*    Basic Metabolic Panel: Recent Labs  Lab 08/31/21 1414  NA 134*  K 3.8  CL 97*  CO2 28  GLUCOSE 133*  BUN 11  CREATININE 0.89  CALCIUM 10.3    Liver Function Tests: Recent Labs  Lab 08/31/21 1414  AST 19  ALT 12  ALKPHOS 69  BILITOT 1.4*  PROT 6.7  ALBUMIN 3.8   Recent Labs  Lab 08/31/21 1414  LIPASE 34   No results for input(s): AMMONIA in the last 168 hours.  Cardiac Enzymes: No results for input(s): CKTOTAL, CKMB, CKMBINDEX, TROPONINI in the last 168 hours.  BNP (last 3 results) No results for input(s): BNP in the last 8760 hours.  ProBNP (last 3 results) No results for input(s): PROBNP in the last 8760 hours.  CBG: No results for input(s): GLUCAP in the last 168 hours.  Lipase     Component Value Date/Time   LIPASE 34 08/31/2021 1414     Urinalysis No results found for: COLORURINE, APPEARANCEUR, LABSPEC, PHURINE, GLUCOSEU, HGBUR, BILIRUBINUR, KETONESUR, PROTEINUR, UROBILINOGEN,  NITRITE, LEUKOCYTESUR   Drugs of Abuse  No results found for: [place], [place], [place], AMPHETMU, THCU, LABBARB    Radiological Exams on Admission: CT Abdomen Pelvis W Contrast  Result Date: 08/31/2021 CLINICAL DATA:  Evaluate for bowel obstruction.  Abdominal pain. EXAM: CT ABDOMEN AND PELVIS WITH CONTRAST TECHNIQUE: Multidetector CT imaging of the abdomen and pelvis was performed using the standard protocol following bolus administration of intravenous contrast. CONTRAST:  54mL OMNIPAQUE IOHEXOL 350 MG/ML SOLN COMPARISON:  CT abdomen and pelvis 08/23/2020. FINDINGS: Lower chest: There is some scarring in the lung bases. Hepatobiliary: Gallbladder surgically absent. There is stable intra and extrahepatic biliary ductal dilatation. No focal liver lesions are identified. Pancreas: Unremarkable. No pancreatic ductal  dilatation or surrounding inflammatory changes. Spleen: Normal in size without focal abnormality. 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 Right adrenal gland is within normal limits. There is no hydronephrosis or perinephric fat stranding. There are cortical hypodensities in both kidneys which are too small to characterize, most likely cysts. The bladder is within normal limits. Stomach/Bowel: There is a central lower ventral hernia containing dilated small bowel loops. This is transition point for small bowel obstruction. Small bowel loops proximal to this level are dilated measuring up to 3.2 cm with associated mesenteric edema. Stomach is moderately distended with air-fluid level. Distal small bowel and colon are decompressed. There is no pneumatosis or free air identified. Colon is nondilated. Appendix is within normal limits. There is colonic diverticulosis without evidence for acute diverticulitis. Vascular/Lymphatic: Aortic atherosclerosis. No enlarged abdominal or pelvic lymph nodes. Reproductive: Small  calcified uterine fibroids are again noted. Adnexa are unremarkable. Other: There is a small amount of free fluid in the ventral hernia and pelvis. Musculoskeletal: There is severe degenerative changes of the right hip. Right hip screws are present. IMPRESSION: 1. Dilated small bowel within lower abdominal ventral hernia with resultant small bowel obstruction. Findings are concerning for strangulated or incarcerated hernia. There is mesenteric edema present which can be seen with vascular compromise. No pneumatosis or free air. 2. Small amount of free fluid in the hernia and pelvis. 3. Cholecystectomy.  Stable biliary ductal dilatation. 4. Colonic diverticulosis. 5.  Aortic Atherosclerosis (ICD10-I70.0). 6. These results were called by telephone at the time of interpretation on 08/31/2021 at 5:07 pm to provider Fredia Sorrow , who verbally acknowledged these results. Electronically Signed   By: Ronney Asters M.D.   On: 08/31/2021 17:07   DG Abd 2 Views  Result Date: 08/31/2021 CLINICAL DATA:  Abdominal pain, nausea, and vomiting. EXAM: ABDOMEN - 2 VIEW COMPARISON:  None. FINDINGS: Scattered air in the stomach, small bowel, and colon. A few dilated loops of small bowel measuring up to 4.3 cm in diameter are noted with multiple air-fluid levels on upright series. The colon is normal in caliber. Mild fecal burden. No free intraperitoneal air on upright series. Right femoral hardware is unchanged compared to prior exams. 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 Vascular calcifications in pelvis. Multilevel degenerative disc disease in the lumbar spine. IMPRESSION: A few dilated small bowel loops with air-fluid levels are concerning for possible bowel obstruction. Electronically Signed   By: Ileana Roup M.D.   On: 08/31/2021 14:16    EKG: Personally reviewed by me which shows subtle ST segment elevation in inferior leads.  Assessment/Plan Principal Problem:    Bowel obstruction (HCC) Active Problems:   Emphysema lung ([place])   Incarcerated hernia Abnormal EKG  Incarcerated ventral hernia with small bowel obstruction.  General surgery has been notified in the ED.  Recommended NG tube placement.  Might need a surgical intervention.  Continue with IV fluids n.p.o. antiemetics and analgesics as needed.  Lipase within normal limits.  History of COPD emphysema.  Continue bronchodilators.  Appears to be compensated at this time.  Patient follows up with Dr. Chase Caller as outpatient.  Volume depletion secondary to bowel obstruction.  Continue with IV fluids.  Continue normal saline hydration for now.  Leukocytosis could be reactive.  We will continue to monitor.  Abnormal EKG with ST elevation in the inferior leads.  No chest pain.  Patient denies any dyspnea.  We will trend troponins.  Troponins have been requested from the ED.  I have spoken with the ED provider to let the Cardiology know about it.  History of hypertension on amlodipine as outpatient.Hold oral medication.  We will consider IV antihypertensives  DVT Prophylaxis: Lovenox subcu  Consultant: General surgery  Code Status: Full code  Microbiology none  Antibiotics: None  Family Communication:  Patients' condition and plan of care including tests being ordered have been discussed with the patient and the patient's niece at bedside who indicate understanding and agree with the plan.  Status is: Inpatient   Dispo: The patient is from: Home              Anticipated d/c is to: Home         Severity of Illness: The appropriate patient status for this patient is INPATIENT. Inpatient status is judged to be reasonable and necessary in order to provide the required intensity of service to ensure the patient's safety. The patient's presenting symptoms, physical exam findings, and initial radiographic and laboratory data in the context of their chronic comorbidities is felt to place them at high  risk for further clinical deterioration. Furthermore, it is not anticipated that the patient will be medically stable for discharge from the hospital within 2 midnights of admission. T I certify that at the point of admission it is my clinical judgment that the patient will require inpatient hospital care spanning beyond 2 midnights from the point of admission due to high intensity of service, high risk for further deterioration and high frequency of surveillance required.  Signed, Flora Lipps, MD [facility] 08/31/2021

## 2021-08-31 NOTE — ED Notes (Signed)
Pt was provided perineal care, new linens applied to bed. Pt was assisted with toileting via bedpan. Pt repositioned to comfort.

## 2021-08-31 NOTE — ED Notes (Signed)
Rounded on pt. Pt currently denies any complaints and did not need any further assistance at this time.  ?

## 2021-08-31 NOTE — ED Triage Notes (Signed)
Pt complains of abdominal pai, n/v and urge to have BM but cannot. Started yesterday.

## 2021-08-31 NOTE — Op Note (Signed)
Preoperative diagnosis: Incarcerated incisional hernia  Postoperative diagnosis: Same  Procedure: Exploratory laparotomy with primary repair of incarcerated incisional hernia and excision of abdominal skin tag  Surgeon: Erroll Luna, MD  Anesthesia: General with Exparel diluted 20 cc with 20 cc of saline.  A total of 30 cc was injected  Drains: 19 round  IV fluids: Per anesthesia record  Indications for procedure: The patient is an 80 year old female who presents for emergent repair of incarcerated incisional hernia.  Evaluation which included a CT scan showed small bowel incarcerated within the hernia sac and concern for possible ischemia.  Risks and benefits of surgery discussed the patient as well as her daughter.  Due to the emergent nature of this concern for bowel ischemia recommended emergent repair.The risk of hernia repair include bleeding,  Infection,   Recurrence of the hernia,  Mesh use, chronic pain,  Organ injury,  Bowel injury,  Bladder injury,   nerve injury with numbness around the incision,  Death,  and worsening of preexisting  medical problems.  The alternatives to surgery have been discussed as well..  Long term expectations of both operative and non operative treatments have been discussed.   The patient agrees to proceed.   Description of procedure: The patient was met in the holding area.  Questions were answered.  She was taken back to the operative room.  She is placed supine upon the OR table.  After induction of general anesthesia, her abdomen was prepped and draped in sterile fashion and timeout performed.  Of note a Foley cath was placed after anesthesia induction under sterile conditions.  Timeout was done.  She received appropriate antibiotics.  A lower midline incision was used.  Of note there is a skin tag adjacent to this and excised.  I did not want this flopping into the incision after surgery.  This was passed off the field and sent to pathology.  This was done  with a scalpel blade.  The base was fulgurated.  Once incision was opened through an old scar in the lower midline, I was able to dissect out the hernia sac.  This was tightly incarcerated.  Once I mobilized it I then opened the fascia in the midline.  Is able get my finger up onto the fascia and connected down to the defect where the hernia was incarcerated.  I used Metzenbaum scissors to open the fascia.  Once open this was a very large hernia sac.  It was quite thickened.  I opened the hernia sac which was a large thickened hypertrophic area of peritoneum in the lower midline.  I identified the small bowel that was incarcerated bed reduce spontaneously.  The area where it was incarcerated was noted but was viable.  Are in the small bowel both proximal and distal to this and saw no other evidence of injury to small bowel.  This was observed for about 15 to 20 minutes and looked viable.  I reduce his back in the abdominal cavity.  Given the emergent nature and her advanced age and multiple medical problems, I elected to do a primary repair emergently.  I was able to close the peritoneum using 2-0 Vicryl to preserve that space.  The fascia was then closed with interrupted #1 Novafil pop-off sutures.  This closed the defect.  Of note she had extremely thin attenuated poor fascia.  I then placed a 19 round drain into the space where the hernia sac had occupied the right lower quadrant.  This was  secured to skin with 2-0 nylon.  Irrigation was used.  We then closed the skin with staples.  Honeycomb dressing placed.  Bulb suction placed to drain.  Neosporin and Band-Aid placed over the skin tag excision site.  All final counts were found to be correct.  Abdominal binder placed.  The patient was awoke extubated taken to recovery in satisfactory condition.

## 2021-08-31 NOTE — ED Provider Notes (Addendum)
[facility] DEPT Provider Note   CSN: [id number] Arrival date & time: 08/31/21  1249     History Chief Complaint  Patient presents with   Abdominal Pain    Christina Branch is a 80 y.o. female.  Patient with a complaint of abdominal pain and a bulge in the right lower quadrant area.  Started last evening.  Patient never noticed a bulge before.  Has had multiple episodes of vomiting.  No vomiting of blood.  No diarrhea.  But has an urgency like she needs to have a bowel movement.  Patient feels dehydrated.  Past surgical history significant for gallbladder removal and states that she had surgery for colon cancer appears it was a partial resection due to the large polyps they were benign.  So not really colon cancer.      Past Medical History:  Diagnosis Date   Claudication [facility])    DJD (degenerative joint disease) of knee    GERD (gastroesophageal reflux disease)    H/O: varicose veins    Right Leg   Hyperlipidemia    Hypertension    Hypoxemia    Myalgia and myositis, unspecified    Pain in limb    Peripheral neuropathy 03/07/2020   Varicose veins     Patient Active Problem List   Diagnosis Date Noted   Emphysema lung ([place]) 08/03/2021   Tobacco abuse 08/03/2021   Dyspnea 08/03/2021   Peripheral neuropathy 03/07/2020   Primary osteoarthritis of both knees 02/02/2020   Primary osteoarthritis of both hands 02/02/2020   Arthropathy of lumbar facet joint 02/02/2020   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, 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     Family History  Problem Relation Age of Onset   Cancer Mother    Breast cancer Sister    Hypertension Son     Social History   Tobacco Use   Smoking status: Every Day    Packs/day: 0.50    Years: 40.00    Pack years: 20.00    Types: Cigarettes    Start date: 11/26/1962   Smokeless tobacco: Never   Tobacco comments:    Currently smoking 2-3 cigs a day as of 08/03/21 bnm  Vaping Use   Vaping Use: Never used  Substance Use Topics   Alcohol use: No   Drug use: No    Home Medications Prior to Admission medications   Medication Sig Start Date End Date Taking? Authorizing Provider  albuterol (VENTOLIN HFA) 108 (90 Base) MCG/ACT inhaler Inhale 1-2 puffs into the lungs every 6 (six) hours as needed. 08/03/21   Parrett, Fonnie Mu, NP  amLODipine (NORVASC) 10 MG tablet Take 10 mg by mouth daily.  03/31/12   [provider]  diclofenac Sodium (VOLTAREN) 1 % GEL APPLY EXTERNALLY TO THE LEFT ANKLE AND LEFT FOOT EVERY 12 HOURS 02/01/20   [provider]  HYDROcodone-ibuprofen (VICOPROFEN) 7.5-200 MG per tablet Take 1 tablet by mouth every 6 (six) hours as needed for moderate pain.  06/03/12   [provider]  loratadine (CLARITIN) 10 MG tablet TK 1 T PO QD. Patient not taking: Reported on 08/03/2021 04/15/17   [provider]  Multiple Vitamins-Minerals (CENTRUM SILVER PO) Take 1 tablet by mouth daily.     [provider]  Vitamin D, Ergocalciferol, (DRISDOL) 50000 units CAPS capsule TK 1 C PO Q WK 04/15/17   [provider]    Allergies    Penicillins, Shellfish allergy, Sulfa antibiotics, and Tuna [fish allergy]  Review of Systems   Review of Systems  Constitutional:  Negative for chills and fever.  HENT:  Negative for ear pain and sore throat.   Eyes:  Negative for pain and  visual disturbance.  Respiratory:  Negative for cough and shortness of breath.   Cardiovascular:  Negative for chest pain and palpitations.  Gastrointestinal:  Positive for abdominal pain, nausea and vomiting.  Genitourinary:  Negative for dysuria and hematuria.  Musculoskeletal:  Negative for arthralgias and back pain.  Skin:  Negative for color change and rash.  Neurological:  Negative for seizures and syncope.  All other systems reviewed and are negative.  Physical Exam Updated Vital Signs BP (!) 164/76   Pulse 79   Temp 98.9 F (37.2 C) (Oral)   Resp (!) 7   SpO2 97%   Physical Exam Vitals and nursing note reviewed.  Constitutional:      General: She is not in acute distress.    Appearance: Normal appearance. She is well-developed.  HENT:     Head: Normocephalic and atraumatic.     Mouth/Throat:     Mouth: Mucous membranes are dry.  Eyes:     Extraocular Movements: Extraocular movements intact.     Conjunctiva/sclera: Conjunctivae normal.     Pupils: Pupils are equal, round, and reactive to light.  Cardiovascular:     Rate and Rhythm: Normal rate and regular rhythm.     Heart sounds: No murmur heard. Pulmonary:     Effort: Pulmonary effort is normal. No respiratory distress.     Breath sounds: Normal breath sounds.  Abdominal:     General: There is no distension.     Palpations: Abdomen is soft. There is mass.     Tenderness: There is no abdominal tenderness. There is no guarding or rebound.     Comments: Bowel sounds increased.  Right lower quadrant mass suggestive of hernia.  Not able to reduce.  Mass measures about 4 x 4 cm  Musculoskeletal:        General: No swelling.     Cervical back: Neck supple.  Skin:    General: Skin is warm and dry.  Neurological:     General: No focal deficit present.     Mental Status: She is alert and oriented to person, place, and time.    ED Results / Procedures / Treatments   Labs (all labs ordered are listed, but only  abnormal results are displayed) Labs Reviewed  COMPREHENSIVE METABOLIC PANEL - Abnormal; Notable for the following components:      Result Value   Sodium 134 (*)    Chloride 97 (*)    Glucose, Bld 133 (*)    Total Bilirubin 1.4 (*)    All other components within normal limits  CBC WITH DIFFERENTIAL/PLATELET - Abnormal; Notable for the following components:   WBC 12.7 (*)  RBC 5.29 (*)    Hemoglobin 15.8 (*)    HCT 49.0 (*)    Platelets 420 (*)    Neutro Abs 10.6 (*)    All other components within normal limits  RESP PANEL BY RT-PCR (FLU A&B, COVID) ARPGX2  LIPASE, BLOOD  URINALYSIS, ROUTINE W REFLEX MICROSCOPIC    EKG None  Radiology CT Abdomen Pelvis W Contrast  Result Date: 08/31/2021 CLINICAL DATA:  Evaluate for bowel obstruction.  Abdominal pain. EXAM: CT ABDOMEN AND PELVIS WITH CONTRAST TECHNIQUE: Multidetector CT imaging of the abdomen and pelvis was performed using the standard protocol following bolus administration of intravenous contrast. CONTRAST:  60mL OMNIPAQUE IOHEXOL 350 MG/ML SOLN COMPARISON:  CT abdomen and pelvis 08/23/2020. FINDINGS: Lower chest: There is some scarring in the lung bases. Hepatobiliary: Gallbladder surgically absent. There is stable intra and extrahepatic biliary ductal dilatation. No focal liver lesions are identified. Pancreas: Unremarkable. No pancreatic ductal dilatation or surrounding inflammatory changes. Spleen: Normal in size without focal abnormality. 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 Right adrenal gland is within normal limits. There is no hydronephrosis or perinephric fat stranding. There are cortical hypodensities in both kidneys which are too small to characterize, most likely cysts. The bladder is within normal limits. Stomach/Bowel: There is a central lower ventral hernia containing dilated small bowel loops. This is transition point for small bowel  obstruction. Small bowel loops proximal to this level are dilated measuring up to 3.2 cm with associated mesenteric edema. Stomach is moderately distended with air-fluid level. Distal small bowel and colon are decompressed. There is no pneumatosis or free air identified. Colon is nondilated. Appendix is within normal limits. There is colonic diverticulosis without evidence for acute diverticulitis. Vascular/Lymphatic: Aortic atherosclerosis. No enlarged abdominal or pelvic lymph nodes. 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 Adnexa are unremarkable. Other: There is a small amount of free fluid in the ventral hernia and pelvis. Musculoskeletal: There is severe degenerative changes of the right hip. Right hip screws are present. IMPRESSION: 1. Dilated small bowel within lower abdominal ventral hernia with resultant small bowel obstruction. Findings are concerning for strangulated or incarcerated hernia. There is mesenteric edema present which can be seen with vascular compromise. No pneumatosis or free air. 2. Small amount of free fluid in the hernia and pelvis. 3. Cholecystectomy.  Stable biliary ductal dilatation. 4. Colonic diverticulosis. 5.  Aortic Atherosclerosis (ICD10-I70.0). 6. These results were called by telephone at the time of interpretation on 08/31/2021 at 5:07 pm to provider Fredia Sorrow , who verbally acknowledged these results. Electronically Signed   By: Ronney Asters M.D.   On: 08/31/2021 17:07   DG Abd 2 Views  Result Date: 08/31/2021 CLINICAL DATA:  Abdominal pain, nausea, and vomiting. EXAM: ABDOMEN - 2 VIEW COMPARISON:  None. FINDINGS: Scattered air in the stomach, small bowel, and colon. A few dilated loops of small bowel measuring up to 4.3 cm in diameter are noted with multiple air-fluid levels on upright series. The colon is normal in caliber. Mild fecal burden. No free intraperitoneal air on upright series. Right femoral hardware is unchanged compared to prior  exams. 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 Vascular calcifications in pelvis. Multilevel degenerative disc disease in the lumbar spine. IMPRESSION: A few dilated small bowel loops with air-fluid levels are concerning for possible bowel obstruction. Electronically Signed   By: Ileana Roup M.D.   On: 08/31/2021  14:16    Procedures Procedures   Medications Ordered in ED Medications  0.9 %  sodium chloride infusion (has no administration in time range)  sodium chloride 0.9 % bolus 1,000 mL (1,000 mLs Intravenous New Bag/Given 08/31/21 1604)  ondansetron (ZOFRAN) injection 4 mg (4 mg Intravenous Given 08/31/21 1606)  HYDROmorphone (DILAUDID) injection 0.5 mg (0.5 mg Intravenous Given 08/31/21 1608)  iohexol (OMNIPAQUE) 350 MG/ML injection 80 mL (80 mLs Intravenous Contrast Given 08/31/21 1627)    ED Course  I have reviewed the triage vital signs and the nursing notes.  Pertinent labs & imaging results that were available during my care of the patient were reviewed by me and considered in my medical decision making (see chart for details).    MDM Rules/Calculators/A&P                           Patient's labs significant for mild leukocytosis.  Renal functions normal.  Patient had an acute abdominal series which raises concerns for bowel obstruction.  Based on this and the exam I am suspicious for a nonreducible hernia as a possible cause.  Patient will get CT scan of the abdomen and pelvis.  Patient will receive IV fluids.  Pain medicine and antinausea medicine.  And COVID screening.  Past CT from a year ago showed that she had a ventral wall hernia in that area.  Today CT shows an incarcerated hernia with a loop of small bowel and there with a small bowel obstruction.  Will discuss with general surgery.  EKG with some slight ST segment elevation inferiorly.  Patient without any chest pain.  But will order troponins.  Discussed with Dr.  Brantley Stage from general surgery.  They are recommending NG tube they will see the patient in consultation.  They want internal medicine to admit.  Final Clinical Impression(s) / ED Diagnoses Final diagnoses:  Abdominal pain  Ventral hernia with bowel obstruction    Rx / DC Orders ED Discharge Orders     None        Fredia Sorrow, MD 08/31/21 1548    Fredia Sorrow, MD 08/31/21 1710    Fredia Sorrow, MD 08/31/21 1713    Fredia Sorrow, MD 08/31/21 1732

## 2021-08-31 NOTE — Transfer of Care (Signed)
Immediate Anesthesia Transfer of Care Note  Patient: Christina Branch  Procedure(s) Performed: EXPLORATORY LAPAROTOMY PRIMARY REPAIR INCISIONAL HERNIA; EXCISION OF ABDOMINAL SKIN TAG  Patient Location: PACU  Anesthesia Type:General  Level of Consciousness: awake, alert  and oriented  Airway & Oxygen Therapy: Patient Spontanous Breathing and Patient connected to face mask oxygen  Post-op Assessment: Report given to RN and Post -op Vital signs reviewed and stable  Post vital signs: Reviewed and stable  Last Vitals:  Vitals Value Taken Time  BP 206/91 08/31/21 2204  Temp    Pulse 75 08/31/21 2206  Resp    SpO2 100 % 08/31/21 2206  Vitals shown include unvalidated device data.  Last Pain:  Vitals:   08/31/21 1945  TempSrc: Oral  PainSc:          Complications: No notable events documented.

## 2021-09-01 ENCOUNTER — Encounter (HOSPITAL_COMMUNITY): Payer: Self-pay | Admitting: Surgery

## 2021-09-01 ENCOUNTER — Inpatient Hospital Stay (HOSPITAL_COMMUNITY): Payer: Medicare Other

## 2021-09-01 DIAGNOSIS — R109 Unspecified abdominal pain: Secondary | ICD-10-CM

## 2021-09-01 DIAGNOSIS — R1084 Generalized abdominal pain: Secondary | ICD-10-CM

## 2021-09-01 DIAGNOSIS — K436 Other and unspecified ventral hernia with obstruction, without gangrene: Secondary | ICD-10-CM

## 2021-09-01 LAB — CBC
HCT: 42.8 % (ref 36.0–46.0)
Hemoglobin: 13.8 g/dL (ref 12.0–15.0)
MCH: 30.2 pg (ref 26.0–34.0)
MCHC: 32.2 g/dL (ref 30.0–36.0)
MCV: 93.7 fL (ref 80.0–100.0)
Platelets: 347 10*3/uL (ref 150–400)
RBC: 4.57 MIL/uL (ref 3.87–5.11)
RDW: 14.7 % (ref 11.5–15.5)
WBC: 13.3 10*3/uL — ABNORMAL HIGH (ref 4.0–10.5)
nRBC: 0 % (ref 0.0–0.2)

## 2021-09-01 LAB — PHOSPHORUS: Phosphorus: 3.9 mg/dL (ref 2.5–4.6)

## 2021-09-01 LAB — PROTIME-INR
INR: 1 (ref 0.8–1.2)
Prothrombin Time: 12.7 seconds (ref 11.4–15.2)

## 2021-09-01 LAB — COMPREHENSIVE METABOLIC PANEL
ALT: 11 U/L (ref 0–44)
AST: 18 U/L (ref 15–41)
Albumin: 2.8 g/dL — ABNORMAL LOW (ref 3.5–5.0)
Alkaline Phosphatase: 53 U/L (ref 38–126)
Anion gap: 5 (ref 5–15)
BUN: 10 mg/dL (ref 8–23)
CO2: 27 mmol/L (ref 22–32)
Calcium: 9.6 mg/dL (ref 8.9–10.3)
Chloride: 106 mmol/L (ref 98–111)
Creatinine, Ser: 0.82 mg/dL (ref 0.44–1.00)
GFR, Estimated: >60 mL/min (ref >60)
Glucose, Bld: 138 mg/dL — ABNORMAL HIGH (ref 70–99)
Potassium: 3.7 mmol/L (ref 3.5–5.1)
Sodium: 138 mmol/L (ref 135–145)
Total Bilirubin: 1 mg/dL (ref 0.3–1.2)
Total Protein: 5.3 g/dL — ABNORMAL LOW (ref 6.5–8.1)

## 2021-09-01 LAB — MAGNESIUM: Magnesium: 1.7 mg/dL (ref 1.7–2.4)

## 2021-09-01 MED ORDER — ORAL CARE MOUTH RINSE
15.0000 mL | Freq: Two times a day (BID) | OROMUCOSAL | Status: DC
  Administered 2021-09-01 – 2021-09-04 (×5): 15 mL via OROMUCOSAL

## 2021-09-01 MED ORDER — POTASSIUM CHLORIDE 10 MEQ/100ML IV SOLN
10.0000 meq | INTRAVENOUS | Status: AC
  Administered 2021-09-01 – 2021-09-02 (×4): 10 meq via INTRAVENOUS
  Filled 2021-09-01 (×4): qty 100

## 2021-09-01 MED ORDER — CHLORHEXIDINE GLUCONATE CLOTH 2 % EX PADS
6.0000 | MEDICATED_PAD | Freq: Every day | CUTANEOUS | Status: DC
  Administered 2021-09-01 – 2021-09-04 (×4): 6 via TOPICAL

## 2021-09-01 MED ORDER — MAGNESIUM SULFATE 2 GM/50ML IV SOLN
2.0000 g | Freq: Once | INTRAVENOUS | Status: AC
  Administered 2021-09-01: 2 g via INTRAVENOUS
  Filled 2021-09-01: qty 50

## 2021-09-01 NOTE — Anesthesia Postprocedure Evaluation (Signed)
Anesthesia Post Note  Patient: Christina Branch  Procedure(s) Performed: EXPLORATORY LAPAROTOMY PRIMARY REPAIR INCISIONAL HERNIA; EXCISION OF ABDOMINAL SKIN TAG     Patient location during evaluation: PACU Anesthesia Type: General Level of consciousness: awake and alert and oriented Pain management: pain level controlled Vital Signs Assessment: post-procedure vital signs reviewed and stable Respiratory status: spontaneous breathing, nonlabored ventilation and respiratory function stable Cardiovascular status: blood pressure returned to baseline and stable Postop Assessment: no apparent nausea or vomiting Anesthetic complications: no   No notable events documented.  Last Vitals:  Vitals:   08/31/21 2320 08/31/21 2322  BP: (!) 163/68 (!) 163/68  Pulse:  82  Resp: 20 20  Temp: 36.9 C 36.9 C  SpO2: 93% 92%    Last Pain:  Vitals:   08/31/21 2344  TempSrc:   PainSc: 6                  Aric Jost A.

## 2021-09-01 NOTE — Progress Notes (Signed)
Progress Note  1 Day Post-Op  Subjective: Having some post op abdominal pain but overall pain improved. Distension improved but still present. No nausea or emesis. No flatus or BM   Objective: Vital signs in last 24 hours: Temp:  [98.4 F (36.9 C)-99.3 F (37.4 C)] 99.3 F (37.4 C) (10/07 0514) Pulse Rate:  [53-140] 73 (10/07 0514) Resp:  [7-31] 14 (10/07 0514) BP: (121-205)/(56-165) 121/56 (10/07 0514) SpO2:  [87 %-100 %] 92 % (10/07 0514) Weight:  [82 kg] 82 kg (10/07 0411)    Intake/Output from previous day: 10/06 0701 - 10/07 0700 In: 2343.2 [I.V.:2343.2] Out: 1232 [Urine:1075; Emesis/NG output:75; Drains:32; Blood:50] Intake/Output this shift: No intake/output data recorded.  PE: General: pleasant, WD, female who is laying in bed in NAD HEENT: head is normocephalic, atraumatic.  Mouth is pink and moist Heart: regular, rate, and rhythm.  Palpable radial pulses bilaterally Lungs: Respiratory effort nonlabored Abd: soft, mildly distended and diffusely TPP greatest over lower quadrants. No rebound or guarding. Drain with sanguinous output. Honeycomb bandage c/d/I with some dried blood. Staples visible and intact. Abd binder in place. NGT with dark bilious output MSK: all 4 extremities are symmetrical with no cyanosis, clubbing, or edema. Skin: warm and dry with no masses, lesions, or rashes Psych: A&Ox3 with an appropriate affect.    Lab Results:  Recent Labs    08/31/21 1414 09/01/21 0500  WBC 12.7* 13.3*  HGB 15.8* 13.8  HCT 49.0* 42.8  PLT 420* 347   BMET Recent Labs    08/31/21 1414 09/01/21 0500  NA 134* 138  K 3.8 3.7  CL 97* 106  CO2 28 27  GLUCOSE 133* 138*  BUN 11 10  CREATININE 0.89 0.82  CALCIUM 10.3 9.6   PT/INR Recent Labs    09/01/21 0500  LABPROT 12.7  INR 1.0   CMP     Component Value Date/Time   NA 138 09/01/2021 0500   K 3.7 09/01/2021 0500   CL 106 09/01/2021 0500   CO2 27 09/01/2021 0500   GLUCOSE 138 (H) 09/01/2021  0500   BUN 10 09/01/2021 0500   CREATININE 0.82 09/01/2021 0500   CALCIUM 9.6 09/01/2021 0500   PROT 5.3 (L) 09/01/2021 0500   ALBUMIN 2.8 (L) 09/01/2021 0500   AST 18 09/01/2021 0500   ALT 11 09/01/2021 0500   ALKPHOS 53 09/01/2021 0500   BILITOT 1.0 09/01/2021 0500   GFRNONAA >60 09/01/2021 0500   GFRAA >90 06/17/2014 2217   Lipase     Component Value Date/Time   LIPASE 34 08/31/2021 1414       Studies/Results: DG Abd 1 View  Result Date: 09/01/2021 CLINICAL DATA:  80 year old female with history of nasogastric tube placement. EXAM: ABDOMEN - 1 VIEW COMPARISON:  Abdominal radiograph 08/31/2021. FINDINGS: Interval placement of nasogastric tube with tip in the antral pre-pyloric region of the stomach. Multiple dilated loops of small bowel are again noted in the central abdomen measuring up to 4.7 cm in diameter. Gas and stool are also noted throughout the visualized portions of the colon. No definite pneumoperitoneum confidently identified on this single supine image. IMPRESSION: 1. Tip of nasogastric tube is in the antral pre-pyloric region of the stomach. 2. Bowel gas pattern again suggests early or partial small bowel obstruction. Electronically Signed   By: Vinnie Langton M.D.   On: 09/01/2021 06:18   CT Abdomen Pelvis W Contrast  Result Date: 08/31/2021 CLINICAL DATA:  Evaluate for bowel obstruction.  Abdominal pain. EXAM:  CT ABDOMEN AND PELVIS WITH CONTRAST TECHNIQUE: Multidetector CT imaging of the abdomen and pelvis was performed using the standard protocol following bolus administration of intravenous contrast. CONTRAST:  49mL OMNIPAQUE IOHEXOL 350 MG/ML SOLN COMPARISON:  CT abdomen and pelvis 08/23/2020. FINDINGS: Lower chest: There is some scarring in the lung bases. Hepatobiliary: Gallbladder surgically absent. There is stable intra and extrahepatic biliary ductal dilatation. No focal liver lesions are identified. Pancreas: Unremarkable. No pancreatic ductal dilatation or  surrounding inflammatory changes. Spleen: Normal in size without focal abnormality. 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 Right adrenal gland is within normal limits. There is no hydronephrosis or perinephric fat stranding. There are cortical hypodensities in both kidneys which are too small to characterize, most likely cysts. The bladder is within normal limits. Stomach/Bowel: There is a central lower ventral hernia containing dilated small bowel loops. This is transition point for small bowel obstruction. Small bowel loops proximal to this level are dilated measuring up to 3.2 cm with associated mesenteric edema. Stomach is moderately distended with air-fluid level. Distal small bowel and colon are decompressed. There is no pneumatosis or free air identified. Colon is nondilated. Appendix is within normal limits. There is colonic diverticulosis without evidence for acute diverticulitis. Vascular/Lymphatic: Aortic atherosclerosis. No enlarged abdominal or pelvic lymph nodes. 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 Adnexa are unremarkable. Other: There is a small amount of free fluid in the ventral hernia and pelvis. Musculoskeletal: There is severe degenerative changes of the right hip. Right hip screws are present. IMPRESSION: 1. Dilated small bowel within lower abdominal ventral hernia with resultant small bowel obstruction. Findings are concerning for strangulated or incarcerated hernia. There is mesenteric edema present which can be seen with vascular compromise. No pneumatosis or free air. 2. Small amount of free fluid in the hernia and pelvis. 3. Cholecystectomy.  Stable biliary ductal dilatation. 4. Colonic diverticulosis. 5.  Aortic Atherosclerosis (ICD10-I70.0). 6. These results were called by telephone at the time of interpretation on 08/31/2021 at 5:07 pm to provider Fredia Sorrow , who verbally  acknowledged these results. Electronically Signed   By: Ronney Asters M.D.   On: 08/31/2021 17:07   DG Abd 2 Views  Result Date: 08/31/2021 CLINICAL DATA:  Abdominal pain, nausea, and vomiting. EXAM: ABDOMEN - 2 VIEW COMPARISON:  None. FINDINGS: Scattered air in the stomach, small bowel, and colon. A few dilated loops of small bowel measuring up to 4.3 cm in diameter are noted with multiple air-fluid levels on upright series. The colon is normal in caliber. Mild fecal burden. No free intraperitoneal air on upright series. Right femoral hardware is unchanged compared to prior exams. 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 Vascular calcifications in pelvis. Multilevel degenerative disc disease in the lumbar spine. IMPRESSION: A few dilated small bowel loops with air-fluid levels are concerning for possible bowel obstruction. Electronically Signed   By: Ileana Roup M.D.   On: 08/31/2021 14:16    Anti-infectives: Anti-infectives (From admission, onward)    Start     Dose/Rate Route Frequency Ordered Stop   08/31/21 2106  ciprofloxacin (CIPRO) 400 MG/200ML IVPB       Note to Pharmacy: Karie Chimera   : cabinet override      08/31/21 2106 09/01/21 0914   08/31/21 2032  metroNIDAZOLE (FLAGYL) 500 MG/100ML IVPB       Note to Pharmacy: Herbie Drape   : cabinet  override      08/31/21 2032 08/31/21 2059        Assessment/Plan  Incarcerated incisional hernia - POD 1 s/p   Ex lap with primary repair of incarcerated incisional hernia and excision of abdominal skin tag Dr. Brantley Stage 10/6 - drain 32 ml out, ss - continue drain - NGT with 75 ml output - keep to LIWS today - x ray this am with dilated small bowel - can clamp NGT for 30 minutes at a time to ambulate - cont abdominal binder  FEN: NPO, NGT LIWS ID: cipro/flagyl periop VTE: lovenox    LOS: 1 day    Winferd Humphrey, [facility] Surgery 09/01/2021, 7:57 AM Please see  Amion for pager number during day hours 7:00am-4:30pm

## 2021-09-01 NOTE — Progress Notes (Signed)
PROGRESS NOTE    Christina Branch  WJX:[id number] DOB: 29-May-1941 DOA: 08/31/2021 PCP: Vincente Liberty, MD    Brief Narrative:  Christina Branch is a 80 year old female who presented with lower abdominal pain, nausea, vomiting, found to be due to a incarcerated incisional hernia which was also obstructing her small bowel. Hx of djd, gerd, hyperlipidemia, htn, and emphesema. Surgical hx of open cholecystectomy, colonic resection, ORIF right hip. She underwent an ex lap and repair 10/6, today 10/7 is post op day 1.   Assessment & Plan:   Principal Problem:   Bowel obstruction (HCC) Active Problems:   Incarcerated hernia   Emphysema   Hyponatremia   Leukocytosis     Principle problem:   Bowel obstruction due to incarcerated incisional hernia -repaired 10/6 -passing gas 10/7 -bowel sounds present -mild abd tenderness near incision site without erythema or purulent drainage  Mild hyponatremia -10/7 134 Already on IV fluid replacement Will monitor for changes  Leukocytosis 10/6 12.7 10/7 13.3 Will continue to monitor  Emphysema -albuterol neb prn -no current complaints regarding this   Patient continue to be at high risk for worsening abd pain,   Status is: Inpatient  Remains inpatient appropriate because:Ongoing active pain requiring inpatient pain management and IV treatments appropriate due to intensity of illness or inability to take PO  Dispo: The patient is from: Home              Anticipated d/c is to: Home              Patient currently is not medically stable to d/c.   Difficult to place patient No       DVT prophylaxis: lovenox Code Status:   full Family Communication:      Nutrition Status: npo   Consultants:  GI Surgery  Procedures:  Ex lap with incarcerated hernia repair  Antimicrobials:  Ciprofloxacin 10/6-10/7 Flagyl 10/6-10/6   Subjective: Supine in bed, alert, reports she is passing gas, reports improved abd  pain.  Objective: Vitals:   09/01/21 0800 09/01/21 0900 09/01/21 1000 09/01/21 1013  BP:    (!) 121/47  Pulse:    70  Resp: 17 19 11 19   Temp:    99.4 F (37.4 C)  TempSrc:    Oral  SpO2:    97%  Weight:      Height:        Intake/Output Summary (Last 24 hours) at 09/01/2021 1215 Last data filed at 09/01/2021 0601 Gross per 24 hour  Intake 2343.22 ml  Output 1232 ml  Net 1111.22 ml   Filed Weights   08/31/21 2322 09/01/21 0411  Weight: 82 kg 82 kg    Examination:   General: Not in pain or dyspnea  Neurology: Awake and alert, non focal   E ENT: no pallor, no icterus, oral mucosa moist  Cardiovascular: No JVD. S1-S2 present, rhythmic, no gallops, rubs, or murmurs. No lower extremity edema.  Pulmonary: clear breath sounds bilaterally, adequate air movement, no wheezing, rhonchi or rales.  Gastrointestinal. Abdomen flat, no organomegaly, tender near incision site, no erythema, no peritoneal signs  Skin. No rashes  Musculoskeletal: no joint deformities     Data Reviewed: I have personally reviewed following labs and imaging studies  CBC: Recent Labs  Lab 08/31/21 1414 09/01/21 0500  WBC 12.7* 13.3*  NEUTROABS 10.6*  --   HGB 15.8* 13.8  HCT 49.0* 42.8  MCV 92.6 93.7  PLT 420* 213   Basic Metabolic Panel: Recent  Labs  Lab 08/31/21 1414 09/01/21 0500  NA 134* 138  K 3.8 3.7  CL 97* 106  CO2 28 27  GLUCOSE 133* 138*  BUN 11 10  CREATININE 0.89 0.82  CALCIUM 10.3 9.6  MG  --  1.7  PHOS  --  3.9   GFR: Estimated Creatinine Clearance: 60.1 mL/min (by C-G formula based on SCr of 0.82 mg/dL). Liver Function Tests: Recent Labs  Lab 08/31/21 1414 09/01/21 0500  AST 19 18  ALT 12 11  ALKPHOS 69 53  BILITOT 1.4* 1.0  PROT 6.7 5.3*  ALBUMIN 3.8 2.8*   Recent Labs  Lab 08/31/21 1414  LIPASE 34   No results for input(s): AMMONIA in the last 168 hours. Coagulation Profile: Recent Labs  Lab 09/01/21 0500  INR 1.0   Cardiac Enzymes: No  results for input(s): CKTOTAL, CKMB, CKMBINDEX, TROPONINI in the last 168 hours. BNP (last 3 results) No results for input(s): PROBNP in the last 8760 hours. HbA1C: No results for input(s): HGBA1C in the last 72 hours. CBG: No results for input(s): GLUCAP in the last 168 hours. Lipid Profile: No results for input(s): CHOL, HDL, LDLCALC, TRIG, CHOLHDL, LDLDIRECT in the last 72 hours. Thyroid Function Tests: No results for input(s): TSH, T4TOTAL, FREET4, T3FREE, THYROIDAB in the last 72 hours. Anemia Panel: No results for input(s): VITAMINB12, FOLATE, FERRITIN, TIBC, IRON, RETICCTPCT in the last 72 hours.    Radiology Studies: I have reviewed all of the imaging during this hospital visit personally     Scheduled Meds:  Chlorhexidine Gluconate Cloth  6 each Topical Daily   enoxaparin (LOVENOX) injection  40 mg Subcutaneous Q24H   mouth rinse  15 mL Mouth Rinse BID   Continuous Infusions:  sodium chloride 100 mL/hr at 09/01/21 1006     LOS: 1 day        Naziah Portee D Esra Frankowski, student

## 2021-09-02 LAB — BASIC METABOLIC PANEL
Anion gap: 7 (ref 5–15)
BUN: 9 mg/dL (ref 8–23)
CO2: 25 mmol/L (ref 22–32)
Calcium: 8.7 mg/dL — ABNORMAL LOW (ref 8.9–10.3)
Chloride: 106 mmol/L (ref 98–111)
Creatinine, Ser: 0.8 mg/dL (ref 0.44–1.00)
GFR, Estimated: >60 mL/min (ref >60)
Glucose, Bld: 83 mg/dL (ref 70–99)
Potassium: 3.6 mmol/L (ref 3.5–5.1)
Sodium: 138 mmol/L (ref 135–145)

## 2021-09-02 LAB — CBC
HCT: 38.8 % (ref 36.0–46.0)
Hemoglobin: 12 g/dL (ref 12.0–15.0)
MCH: 30.1 pg (ref 26.0–34.0)
MCHC: 30.9 g/dL (ref 30.0–36.0)
MCV: 97.2 fL (ref 80.0–100.0)
Platelets: 293 10*3/uL (ref 150–400)
RBC: 3.99 MIL/uL (ref 3.87–5.11)
RDW: 14.9 % (ref 11.5–15.5)
WBC: 10.4 10*3/uL (ref 4.0–10.5)
nRBC: 0 % (ref 0.0–0.2)

## 2021-09-02 LAB — MAGNESIUM: Magnesium: 2.2 mg/dL (ref 1.7–2.4)

## 2021-09-02 MED ORDER — MENTHOL 3 MG MT LOZG
1.0000 | LOZENGE | OROMUCOSAL | Status: DC | PRN
  Filled 2021-09-02: qty 9

## 2021-09-02 MED ORDER — METRONIDAZOLE 500 MG/100ML IV SOLN
500.0000 mg | Freq: Two times a day (BID) | INTRAVENOUS | Status: DC
  Administered 2021-09-02 – 2021-09-04 (×4): 500 mg via INTRAVENOUS
  Filled 2021-09-02 (×4): qty 100

## 2021-09-02 MED ORDER — CIPROFLOXACIN IN D5W 200 MG/100ML IV SOLN
200.0000 mg | Freq: Two times a day (BID) | INTRAVENOUS | Status: DC
  Administered 2021-09-02 – 2021-09-04 (×4): 200 mg via INTRAVENOUS
  Filled 2021-09-02 (×5): qty 100

## 2021-09-02 MED ORDER — POTASSIUM CHLORIDE 20 MEQ PO PACK
40.0000 meq | PACK | Freq: Once | ORAL | Status: AC
  Administered 2021-09-02: 40 meq via ORAL
  Filled 2021-09-02: qty 2

## 2021-09-02 NOTE — Progress Notes (Signed)
Progress Note  2 Days Post-Op  Subjective: Passing flatus, no bowel movements.  Sore throat.    Objective: Vital signs in last 24 hours: Temp:  [98.6 F (37 C)-99.4 F (37.4 C)] 99 F (37.2 C) (10/07 2143) Pulse Rate:  [61-72] 66 (10/07 2143) Resp:  [11-20] 20 (10/07 2143) BP: (121-165)/(47-61) 146/60 (10/07 2143) SpO2:  [92 %-98 %] 94 % (10/08 0600) Weight:  [86.4 kg] 86.4 kg (10/08 0500) Last BM Date:  (PTA)  Intake/Output from previous day: 10/07 0701 - 10/08 0700 In: -  Out: 1105 [Urine:700; Emesis/NG output:375; Drains:30] Intake/Output this shift: No intake/output data recorded.  PE: Abd: soft, non-distended, mild tenderness round incision. Drain with sanguinous output. Honeycomb bandage c/d/I with some dried blood. Staples visible and intact. Abd binder in place. NGT output clearing  Lab Results:  Recent Labs    09/01/21 0500 09/02/21 0541  WBC 13.3* 10.4  HGB 13.8 12.0  HCT 42.8 38.8  PLT 347 293    BMET Recent Labs    09/01/21 0500 09/02/21 0541  NA 138 138  K 3.7 3.6  CL 106 106  CO2 27 25  GLUCOSE 138* 83  BUN 10 9  CREATININE 0.82 0.80  CALCIUM 9.6 8.7*    PT/INR Recent Labs    09/01/21 0500  LABPROT 12.7  INR 1.0    CMP     Component Value Date/Time   NA 138 09/02/2021 0541   K 3.6 09/02/2021 0541   CL 106 09/02/2021 0541   CO2 25 09/02/2021 0541   GLUCOSE 83 09/02/2021 0541   BUN 9 09/02/2021 0541   CREATININE 0.80 09/02/2021 0541   CALCIUM 8.7 (L) 09/02/2021 0541   PROT 5.3 (L) 09/01/2021 0500   ALBUMIN 2.8 (L) 09/01/2021 0500   AST 18 09/01/2021 0500   ALT 11 09/01/2021 0500   ALKPHOS 53 09/01/2021 0500   BILITOT 1.0 09/01/2021 0500   GFRNONAA >60 09/02/2021 0541   GFRAA >90 06/17/2014 2217   Lipase     Component Value Date/Time   LIPASE 34 08/31/2021 1414       Studies/Results: DG Abd 1 View  Result Date: 09/01/2021 CLINICAL DATA:  80 year old female with history of nasogastric tube placement. EXAM:  ABDOMEN - 1 VIEW COMPARISON:  Abdominal radiograph 08/31/2021. FINDINGS: Interval placement of nasogastric tube with tip in the antral pre-pyloric region of the stomach. Multiple dilated loops of small bowel are again noted in the central abdomen measuring up to 4.7 cm in diameter. Gas and stool are also noted throughout the visualized portions of the colon. No definite pneumoperitoneum confidently identified on this single supine image. IMPRESSION: 1. Tip of nasogastric tube is in the antral pre-pyloric region of the stomach. 2. Bowel gas pattern again suggests early or partial small bowel obstruction. Electronically Signed   By: Vinnie Langton M.D.   On: 09/01/2021 06:18   CT Abdomen Pelvis W Contrast  Result Date: 08/31/2021 CLINICAL DATA:  Evaluate for bowel obstruction.  Abdominal pain. EXAM: CT ABDOMEN AND PELVIS WITH CONTRAST TECHNIQUE: Multidetector CT imaging of the abdomen and pelvis was performed using the standard protocol following bolus administration of intravenous contrast. CONTRAST:  47mL OMNIPAQUE IOHEXOL 350 MG/ML SOLN COMPARISON:  CT abdomen and pelvis 08/23/2020. FINDINGS: Lower chest: There is some scarring in the lung bases. Hepatobiliary: Gallbladder surgically absent. There is stable intra and extrahepatic biliary ductal dilatation. No focal liver lesions are identified. Pancreas: Unremarkable. No pancreatic ductal dilatation or surrounding inflammatory changes. Spleen: Normal in  size without focal abnormality. 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 Right adrenal gland is within normal limits. There is no hydronephrosis or perinephric fat stranding. There are cortical hypodensities in both kidneys which are too small to characterize, most likely cysts. The bladder is within normal limits. Stomach/Bowel: There is a central lower ventral hernia containing dilated small bowel loops. This is transition point  for small bowel obstruction. Small bowel loops proximal to this level are dilated measuring up to 3.2 cm with associated mesenteric edema. Stomach is moderately distended with air-fluid level. Distal small bowel and colon are decompressed. There is no pneumatosis or free air identified. Colon is nondilated. Appendix is within normal limits. There is colonic diverticulosis without evidence for acute diverticulitis. Vascular/Lymphatic: Aortic atherosclerosis. No enlarged abdominal or pelvic lymph nodes. 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 Adnexa are unremarkable. Other: There is a small amount of free fluid in the ventral hernia and pelvis. Musculoskeletal: There is severe degenerative changes of the right hip. Right hip screws are present. IMPRESSION: 1. Dilated small bowel within lower abdominal ventral hernia with resultant small bowel obstruction. Findings are concerning for strangulated or incarcerated hernia. There is mesenteric edema present which can be seen with vascular compromise. No pneumatosis or free air. 2. Small amount of free fluid in the hernia and pelvis. 3. Cholecystectomy.  Stable biliary ductal dilatation. 4. Colonic diverticulosis. 5.  Aortic Atherosclerosis (ICD10-I70.0). 6. These results were called by telephone at the time of interpretation on 08/31/2021 at 5:07 pm to provider Fredia Sorrow , who verbally acknowledged these results. Electronically Signed   By: Ronney Asters M.D.   On: 08/31/2021 17:07   DG Abd 2 Views  Result Date: 08/31/2021 CLINICAL DATA:  Abdominal pain, nausea, and vomiting. EXAM: ABDOMEN - 2 VIEW COMPARISON:  None. FINDINGS: Scattered air in the stomach, small bowel, and colon. A few dilated loops of small bowel measuring up to 4.3 cm in diameter are noted with multiple air-fluid levels on upright series. The colon is normal in caliber. Mild fecal burden. No free intraperitoneal air on upright series. Right femoral hardware is unchanged  compared to prior exams. 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 Vascular calcifications in pelvis. Multilevel degenerative disc disease in the lumbar spine. IMPRESSION: A few dilated small bowel loops with air-fluid levels are concerning for possible bowel obstruction. Electronically Signed   By: Ileana Roup M.D.   On: 08/31/2021 14:16    Anti-infectives: Anti-infectives (From admission, onward)    Start     Dose/Rate Route Frequency Ordered Stop   08/31/21 2106  ciprofloxacin (CIPRO) 400 MG/200ML IVPB       Note to Pharmacy: Karie Chimera   : cabinet override      08/31/21 2106 09/01/21 0914   08/31/21 2032  metroNIDAZOLE (FLAGYL) 500 MG/100ML IVPB       Note to Pharmacy: Herbie Drape   : cabinet override      08/31/21 2032 08/31/21 2059        Assessment/Plan  Incarcerated incisional hernia - S/p Ex lap with primary repair of incarcerated incisional hernia and excision of abdominal skin tag Dr. Brantley Stage 10/6 - Remove foley - Clamp NG and try clear liquids.  If she does well for 6 hours, remove NG - Monitor JP output - increase activity  FEN: clamp NG and try clears ID: cipro/flagyl periop VTE: lovenox    LOS:  2 days    Felicie Morn, [facility] 09/02/2021, 9:12 AM Please see Amion for pager number during day hours 7:00am-4:30pm

## 2021-09-02 NOTE — Progress Notes (Signed)
PROGRESS NOTE    GILBERT NARAIN  IRC:[id number] DOB: 25-Feb-1941 DOA: 08/31/2021 PCP: Vincente Liberty, MD    Brief Narrative:  Mrs. Krupp was admitted to the hospital with the working diagnosis of acute bowel obstruction due to incarcerated incisional hernia.  Now sp exploratory laparotomy with primary hernia repair.    80 yo female with the past medical history of prior cholecystectomy and partial resection of the colon,GERD, dyslipidemia, HTN, and peripheral vascular disease, who presented with abdominal pain, nausea and vomiting.  Reported 24 hours of persistent and worsening symptoms.  She noted a bulge on the right lower quadrant of her abdomen. On her initial physical examination blood pressure 145/83, heart rate 80, respiratory rate 18, temperature 98.9, oxygen saturation 96%, her lungs were clear to auscultation bilaterally, heart S1-S2, present, rhythmic, the abdomen was distended, tender to palpation lower quadrant, positive nonreducible hernia 4 x 4 cm in size, no lower extremity edema.   Sodium 134, potassium 3.8, chloride 97, bicarb 28, glucose 133, BUN 11, creatinine 0.89, white count 12.7, hemoglobin 15.8, hematocrit 49.0, platelets 420. SARS COVID-19 negative.   CT of the abdomen with dilated small bowel within the lower abdominal ventral hernia with resultant small bowel obstruction.  Findings consistent for strangulated or incarcerated hernia.  Positive mesenteric edema.  No pneumatosis or free air.   EKG 73 bpm, normal axis, normal intervals, sinus rhythm, Q-wave lead II-III-aVF, no significant ST segment or T wave changes.  Patient underwent urgent exploratory laparotomy with hernia repair.  Had post-operative ileus requiring NG tube.   Assessment & Plan:   Principal Problem:   Bowel obstruction (HCC) Active Problems:   Emphysema lung ([place])   Incarcerated hernia   Abdominal pain   Ventral hernia with bowel obstruction   Incarcerated ventral hernia with small  bowel obstruction.  SP exploratory laparotomy with primary repair of incarcerated incisional hernia.   No nausea or vomiting, positive flatus.  Continue to have pain at the site of NG tube.  The tube has been clamped with plans to remove if continue to tolerate clears.   Continue supportive IV fluids, as needed analgesics and antiemetics. Scheduled antiacids.  Out of bed to chair tid with meals, follow with PT and OT.    2. Hyponatremia, hypokalemia and hyponatremia.  Renal function today with serum cr at 0.80 with K at 3,6 and serum bicarbonate at 25. Mg is 2,2  Plan to add 40 meq Kcl po and follow up renal functio in am. Further reduce rate of IV fluids to 50 ml per HR. Follow up renal function and electrolytes in am, keep K at 4 and Mg at 2.   3. COPD No clinical exacerbation.    4. Reactive leukocytosis.  Wbc today is 10,4,  Perioperative antibiotic therapy per surgery team ciprofloxacin and metronidazole.    5. HTN.  Blood pressure has been controlled, continue to hold on antihypertensive medications.     Patient continue to be at high risk for worsening ileus.   Status is: Inpatient  Remains inpatient appropriate because:Inpatient level of care appropriate due to severity of illness  Dispo: The patient is from: Home              Anticipated d/c is to: Home              Patient currently is not medically stable to d/c.   Difficult to place patient No  DVT prophylaxis: Enoxaparin   Code Status:    full Family Communication:  I spoke over the phone with the patient's nice about patient's  condition, plan of care, prognosis and all questions were addressed.    Consultants:  Surgery   Procedures:  Exploratory laparotomy with hernia repair.   Subjective: Patient is feeling better, positive flatus. No nausea or vomiting, tube has been clamped. Continue to have pain in the nasopharyngeal region at the site of the NG tube.   Objective: Vitals:   09/01/21 2141  09/01/21 2143 09/02/21 0500 09/02/21 0600  BP: (!) 146/60 (!) 146/60    Pulse: 71 66    Resp: 20 20    Temp: 99 F (37.2 C) 99 F (37.2 C)    TempSrc:  Oral    SpO2: 92% 94%  94%  Weight:   86.4 kg   Height:        Intake/Output Summary (Last 24 hours) at 09/02/2021 1312 Last data filed at 09/01/2021 1923 Gross per 24 hour  Intake --  Output 905 ml  Net -905 ml   Filed Weights   08/31/21 2322 09/01/21 0411 09/02/21 0500  Weight: 82 kg 82 kg 86.4 kg    Examination:   General: Not in pain or dyspnea deconditioned  Neurology: Awake and alert, non focal  E ENT: mild pallor, no icterus, oral mucosa moist NG tube in place.  Cardiovascular: No JVD. S1-S2 present, rhythmic, no gallops, rubs, or murmurs. No lower extremity edema. Pulmonary: vesicular breath sounds bilaterally, adequate air movement, no wheezing, rhonchi or rales. Gastrointestinal. Abdomen mild distended but soft, non tender to superficial palpation. Drains in place.  Skin. No rashes Musculoskeletal: no joint deformities     Data Reviewed: I have personally reviewed following labs and imaging studies  CBC: Recent Labs  Lab 08/31/21 1414 09/01/21 0500 09/02/21 0541  WBC 12.7* 13.3* 10.4  NEUTROABS 10.6*  --   --   HGB 15.8* 13.8 12.0  HCT 49.0* 42.8 38.8  MCV 92.6 93.7 97.2  PLT 420* 347 789   Basic Metabolic Panel: Recent Labs  Lab 08/31/21 1414 09/01/21 0500 09/02/21 0541  NA 134* 138 138  K 3.8 3.7 3.6  CL 97* 106 106  CO2 28 27 25   GLUCOSE 133* 138* 83  BUN 11 10 9   CREATININE 0.89 0.82 0.80  CALCIUM 10.3 9.6 8.7*  MG  --  1.7 2.2  PHOS  --  3.9  --    GFR: Estimated Creatinine Clearance: 67 mL/min (by C-G formula based on SCr of 0.8 mg/dL). Liver Function Tests: Recent Labs  Lab 08/31/21 1414 09/01/21 0500  AST 19 18  ALT 12 11  ALKPHOS 69 53  BILITOT 1.4* 1.0  PROT 6.7 5.3*  ALBUMIN 3.8 2.8*   Recent Labs  Lab 08/31/21 1414  LIPASE 34   No results for input(s):  AMMONIA in the last 168 hours. Coagulation Profile: Recent Labs  Lab 09/01/21 0500  INR 1.0   Cardiac Enzymes: No results for input(s): CKTOTAL, CKMB, CKMBINDEX, TROPONINI in the last 168 hours. BNP (last 3 results) No results for input(s): PROBNP in the last 8760 hours. HbA1C: No results for input(s): HGBA1C in the last 72 hours. CBG: No results for input(s): GLUCAP in the last 168 hours. Lipid Profile: No results for input(s): CHOL, HDL, LDLCALC, TRIG, CHOLHDL, LDLDIRECT in the last 72 hours. Thyroid Function Tests: No results for input(s): TSH, T4TOTAL, FREET4, T3FREE, THYROIDAB in the last 72 hours. Anemia Panel: No results for input(s): VITAMINB12, FOLATE, FERRITIN, TIBC, IRON, RETICCTPCT in the last 72  hours.    Radiology Studies: I have reviewed all of the imaging during this hospital visit personally     Scheduled Meds:  Chlorhexidine Gluconate Cloth  6 each Topical Daily   enoxaparin (LOVENOX) injection  40 mg Subcutaneous Q24H   mouth rinse  15 mL Mouth Rinse BID   Continuous Infusions:  sodium chloride 75 mL/hr at 09/01/21 1628     LOS: 2 days        Angline Schweigert Gerome Apley, MD

## 2021-09-03 LAB — CBC WITH DIFFERENTIAL/PLATELET
Abs Immature Granulocytes: 0.03 10*3/uL (ref 0.00–0.07)
Basophils Absolute: 0.1 10*3/uL (ref 0.0–0.1)
Basophils Relative: 1 %
Eosinophils Absolute: 0.4 10*3/uL (ref 0.0–0.5)
Eosinophils Relative: 5 %
HCT: 38.2 % (ref 36.0–46.0)
Hemoglobin: 12 g/dL (ref 12.0–15.0)
Immature Granulocytes: 0 %
Lymphocytes Relative: 19 %
Lymphs Abs: 1.7 10*3/uL (ref 0.7–4.0)
MCH: 30 pg (ref 26.0–34.0)
MCHC: 31.4 g/dL (ref 30.0–36.0)
MCV: 95.5 fL (ref 80.0–100.0)
Monocytes Absolute: 1.1 10*3/uL — ABNORMAL HIGH (ref 0.1–1.0)
Monocytes Relative: 13 %
Neutro Abs: 5.5 10*3/uL (ref 1.7–7.7)
Neutrophils Relative %: 62 %
Platelets: 303 10*3/uL (ref 150–400)
RBC: 4 MIL/uL (ref 3.87–5.11)
RDW: 14.4 % (ref 11.5–15.5)
WBC: 8.9 10*3/uL (ref 4.0–10.5)
nRBC: 0 % (ref 0.0–0.2)

## 2021-09-03 LAB — BASIC METABOLIC PANEL
Anion gap: 7 (ref 5–15)
BUN: 7 mg/dL — ABNORMAL LOW (ref 8–23)
CO2: 24 mmol/L (ref 22–32)
Calcium: 9.6 mg/dL (ref 8.9–10.3)
Chloride: 109 mmol/L (ref 98–111)
Creatinine, Ser: 0.58 mg/dL (ref 0.44–1.00)
GFR, Estimated: >60 mL/min (ref >60)
Glucose, Bld: 103 mg/dL — ABNORMAL HIGH (ref 70–99)
Potassium: 4 mmol/L (ref 3.5–5.1)
Sodium: 140 mmol/L (ref 135–145)

## 2021-09-03 MED ORDER — OXYCODONE HCL 5 MG PO TABS: 5.0000 mg | ORAL_TABLET | ORAL | Status: DC | PRN

## 2021-09-03 MED ORDER — HYDROMORPHONE HCL 1 MG/ML IJ SOLN: 0.5000 mg | INTRAMUSCULAR | Status: DC | PRN

## 2021-09-03 MED ORDER — AMLODIPINE BESYLATE 10 MG PO TABS
10.0000 mg | ORAL_TABLET | Freq: Every day | ORAL | Status: DC
  Administered 2021-09-03 – 2021-09-04 (×2): 10 mg via ORAL
  Filled 2021-09-03 (×2): qty 1

## 2021-09-03 MED ORDER — PANTOPRAZOLE SODIUM 40 MG PO TBEC
40.0000 mg | DELAYED_RELEASE_TABLET | Freq: Every day | ORAL | Status: DC
  Administered 2021-09-03 – 2021-09-04 (×2): 40 mg via ORAL
  Filled 2021-09-03 (×2): qty 1

## 2021-09-03 NOTE — Plan of Care (Signed)
  Problem: Clinical Measurements: Goal: Postoperative complications will be avoided or minimized Outcome: Progressing   Problem: Skin Integrity: Goal: Demonstration of wound healing without infection will improve Outcome: Progressing   Problem: Clinical Measurements: Goal: Will remain free from infection Outcome: Progressing   Problem: Clinical Measurements: Goal: Respiratory complications will improve Outcome: Progressing   Problem: Activity: Goal: Risk for activity intolerance will decrease Outcome: Progressing   Problem: Nutrition: Goal: Adequate nutrition will be maintained Outcome: Progressing   Problem: Elimination: Goal: Will not experience complications related to bowel motility Outcome: Progressing   Problem: Elimination: Goal: Will not experience complications related to urinary retention Outcome: Progressing   Problem: Safety: Goal: Ability to remain free from injury will improve Outcome: Progressing   Problem: Skin Integrity: Goal: Risk for impaired skin integrity will decrease Outcome: Progressing

## 2021-09-03 NOTE — Plan of Care (Signed)
  Problem: Skin Integrity: Goal: Demonstration of wound healing without infection will improve 09/03/2021 0230 by Lydia Guiles, RN Outcome: Progressing 09/03/2021 0228 by Lydia Guiles, RN Outcome: Progressing   Problem: Education: Goal: Knowledge of General Education information will improve Description: Including pain rating scale, medication(s)/side effects and non-pharmacologic comfort measures Outcome: Progressing   Problem: Clinical Measurements: Goal: Ability to maintain clinical measurements within normal limits will improve Outcome: Progressing Goal: Will remain free from infection Outcome: Progressing Goal: Respiratory complications will improve Outcome: Progressing Goal: Cardiovascular complication will be avoided Outcome: Progressing   Problem: Activity: Goal: Risk for activity intolerance will decrease Outcome: Progressing   Problem: Nutrition: Goal: Adequate nutrition will be maintained Outcome: Progressing   Problem: Elimination: Goal: Will not experience complications related to bowel motility Outcome: Progressing   Problem: Elimination: Goal: Will not experience complications related to urinary retention Outcome: Progressing   Problem: Pain Managment: Goal: General experience of comfort will improve Outcome: Progressing   Problem: Safety: Goal: Ability to remain free from injury will improve Outcome: Progressing   Problem: Skin Integrity: Goal: Risk for impaired skin integrity will decrease Outcome: Progressing

## 2021-09-03 NOTE — Evaluation (Signed)
Occupational Therapy Evaluation Patient Details Name: Christina Branch MRN: [id number] DOB: Mar 20, 1941 Today's Date: 09/03/2021   History of Present Illness 80 y.o. female admitted 10/6, s/p primary repair incisional hernia 10/6. PMH GERD, HTN, hyperlipidemia, PAD, PN, and DJD.   Clinical Impression   PTA, pt was independent in all ADLs/IADLs. Upon evaluation, pt with reported minimal pain and weakness. Pt currently requiring Mod I for ADLs in standing, sitting, and supervision for functional mobility house distance in hallway with no use of DME and no LOBs. Pt educated on back precautions for pain management and protection of abdominal surgery site. Also educated on use of reacher for laundry IADL to avoid bending. Pt verbalized understanding of education. Due to current level of function, recommending no OT follow up and assistance for IADLs at home. all education completed in preparation for d/c tomorrow.     Recommendations for follow up therapy are one component of a multi-disciplinary discharge planning process, led by the attending physician.  Recommendations may be updated based on patient status, additional functional criteria and insurance authorization.   Follow Up Recommendations  No OT follow up;Supervision - Intermittent    Equipment Recommendations  None recommended by OT    Recommendations for Other Services       Precautions / Restrictions Precautions Precaution Comments: Back for comfort Restrictions Weight Bearing Restrictions: No      Mobility Bed Mobility Overal bed mobility: Modified Independent             General bed mobility comments: increased time    Transfers Overall transfer level: Needs assistance   Transfers: Sit to/from Stand           General transfer comment: Supervision for safety, Used one UE assist for safety during house distance mobility, no unsteadiness or LOB.    Balance Overall balance assessment: No apparent balance  deficits (not formally assessed)                                         ADL either performed or assessed with clinical judgement   ADL Overall ADL's : Needs assistance/impaired                                       General ADL Comments: Pt Mod I for al ADLs in standing and supervision for functional mobility, Mod I for ADLs in sitting.     Vision Patient Visual Report: No change from baseline Additional Comments: wears glasses     Perception     Praxis      Pertinent Vitals/Pain Pain Assessment: Faces Faces Pain Scale: No hurt Pain Intervention(s): Monitored during session     Hand Dominance     Extremity/Trunk Assessment Upper Extremity Assessment Upper Extremity Assessment: Overall WFL for tasks assessed   Lower Extremity Assessment Lower Extremity Assessment: Defer to PT evaluation   Cervical / Trunk Assessment Cervical / Trunk Assessment: Normal   Communication Communication Communication: No difficulties   Cognition Arousal/Alertness: Awake/alert Behavior During Therapy: WFL for tasks assessed/performed Overall Cognitive Status: Within Functional Limits for tasks assessed                                 General Comments: Able to maintain divided attention during  ADL tasks and conversation.   General Comments       Exercises     Shoulder Instructions      Home Living Family/patient expects to be discharged to:: Private residence Living Arrangements: Alone Available Help at Discharge: Family Type of Home: Apartment Home Access: Level entry     Home Layout: One level     Bathroom Shower/Tub: Teacher, early years/pre: Standard     Home Equipment: None          Prior Functioning/Environment Level of Independence: Independent        Comments: Independent in all ADLs/IADLs.        OT Problem List: Decreased strength;Decreased range of motion;Pain      OT  Treatment/Interventions:      OT Goals(Current goals can be found in the care plan section) Acute Rehab OT Goals Patient Stated Goal: To go home OT Goal Formulation: With patient  OT Frequency:     Barriers to D/C:            Co-evaluation              AM-PAC OT "6 Clicks" Daily Activity     Outcome Measure Help from another person eating meals?: None Help from another person taking care of personal grooming?: None Help from another person toileting, which includes using toliet, bedpan, or urinal?: None Help from another person bathing (including washing, rinsing, drying)?: None Help from another person to put on and taking off regular upper body clothing?: None Help from another person to put on and taking off regular lower body clothing?: None 6 Click Score: 20   End of Session Equipment Utilized During Treatment: Other (comment) (abdominal binder) Nurse Communication: Mobility status;Other (comment) (No therapy follow-up)  Activity Tolerance: Patient tolerated treatment well Patient left: in chair;with call bell/phone within reach  OT Visit Diagnosis: Muscle weakness (generalized) (M62.81)                Time: 3016-0109 OT Time Calculation (min): 27 min Charges:  OT General Charges $OT Visit: 1 Visit OT Evaluation $OT Eval Low Complexity: 1 Low OT Treatments $Self Care/Home Management : 8-22 mins  Jackquline Denmark, OTS Acute Rehab Office: [phone]   Lamya Lausch 09/03/2021, 9:45 AM

## 2021-09-03 NOTE — Progress Notes (Signed)
PT Cancellation Note  Patient Details Name: GLEMA TAKAKI MRN: [id number] DOB: 11/01/1941   Cancelled Treatment:    PT order received but eval deferred.  Pt screened by OT services as IND with all mobility and ADL and with no current PT needs.  PT service will sign off at this time.   Jana Swartzlander 09/03/2021, 4:11 PM

## 2021-09-03 NOTE — Progress Notes (Signed)
Progress Note  3 Days Post-Op  Subjective: Passing flatus, no bowel movements.  Happy to have NG removed.    Objective: Vital signs in last 24 hours: Temp:  [99.1 F (37.3 C)-100 F (37.8 C)] 99.1 F (37.3 C) (10/09 0422) Pulse Rate:  [63-72] 63 (10/09 0422) Resp:  [16-20] 16 (10/09 0422) BP: (152-169)/(57-68) 169/66 (10/09 0422) SpO2:  [93 %-97 %] 93 % (10/09 0422) Weight:  [85.2 kg] 85.2 kg (10/09 0437) Last BM Date:  (PTA)  Intake/Output from previous day: 10/08 0701 - 10/09 0700 In: 3351.4 [P.O.:120; I.V.:2830.9; IV Piggyback:400.5] Out: 955 [Urine:900; Drains:55] Intake/Output this shift: No intake/output data recorded.  PE: Abd: soft, non-distended, mild tenderness round incision. Drain with sanguinous output. Honeycomb bandage c/d/I with some dried blood. Staples visible and intact. Abd binder in place.   Lab Results:  Recent Labs    09/02/21 0541 09/03/21 0530  WBC 10.4 8.9  HGB 12.0 12.0  HCT 38.8 38.2  PLT 293 303    BMET Recent Labs    09/02/21 0541 09/03/21 0530  NA 138 140  K 3.6 4.0  CL 106 109  CO2 25 24  GLUCOSE 83 103*  BUN 9 7*  CREATININE 0.80 0.58  CALCIUM 8.7* 9.6    PT/INR Recent Labs    09/01/21 0500  LABPROT 12.7  INR 1.0    CMP     Component Value Date/Time   NA 140 09/03/2021 0530   K 4.0 09/03/2021 0530   CL 109 09/03/2021 0530   CO2 24 09/03/2021 0530   GLUCOSE 103 (H) 09/03/2021 0530   BUN 7 (L) 09/03/2021 0530   CREATININE 0.58 09/03/2021 0530   CALCIUM 9.6 09/03/2021 0530   PROT 5.3 (L) 09/01/2021 0500   ALBUMIN 2.8 (L) 09/01/2021 0500   AST 18 09/01/2021 0500   ALT 11 09/01/2021 0500   ALKPHOS 53 09/01/2021 0500   BILITOT 1.0 09/01/2021 0500   GFRNONAA >60 09/03/2021 0530   GFRAA >90 06/17/2014 2217   Lipase     Component Value Date/Time   LIPASE 34 08/31/2021 1414       Studies/Results: No results found.  Anti-infectives: Anti-infectives (From admission, onward)    Start      Dose/Rate Route Frequency Ordered Stop   09/02/21 1500  ciprofloxacin (CIPRO) IVPB 200 mg        200 mg 100 mL/hr over 60 Minutes Intravenous Every 12 hours 09/02/21 1341     09/02/21 1500  metroNIDAZOLE (FLAGYL) IVPB 500 mg        500 mg 100 mL/hr over 60 Minutes Intravenous Every 12 hours 09/02/21 1341     08/31/21 2106  ciprofloxacin (CIPRO) 400 MG/200ML IVPB       Note to Pharmacy: Karie Chimera   : cabinet override      08/31/21 2106 09/01/21 0914   08/31/21 2032  metroNIDAZOLE (FLAGYL) 500 MG/100ML IVPB       Note to Pharmacy: Herbie Drape   : cabinet override      08/31/21 2032 08/31/21 2059        Assessment/Plan  Incarcerated incisional hernia - S/p Ex lap with primary repair of incarcerated incisional hernia and excision of abdominal skin tag Dr. Brantley Stage 10/6 - Advance diet as tolerated - Monitor JP output - increase activity  FEN: advance diet as tolerated ID: cipro/flagyl periop VTE: lovenox    LOS: 3 days    Felicie Morn, [facility] 09/03/2021, 9:08 AM Please see Amion  for pager number during day hours 7:00am-4:30pm

## 2021-09-03 NOTE — Progress Notes (Signed)
PROGRESS NOTE    Christina Branch  TDV:[id number] DOB: Sep 02, 1941 DOA: 08/31/2021 PCP: Vincente Liberty, MD    Brief Narrative:  Christina Branch was admitted to the hospital with the working diagnosis of acute bowel obstruction due to incarcerated incisional hernia.  Now sp exploratory laparotomy with primary hernia repair.    80 yo female with the past medical history of prior cholecystectomy and partial resection of the colon,GERD, dyslipidemia, HTN, and peripheral vascular disease, who presented with abdominal pain, nausea and vomiting.  Reported 24 hours of persistent and worsening symptoms.  She noted a bulge on the right lower quadrant of her abdomen. On her initial physical examination blood pressure 145/83, heart rate 80, respiratory rate 18, temperature 98.9, oxygen saturation 96%, her lungs were clear to auscultation bilaterally, heart S1-S2, present, rhythmic, the abdomen was distended, tender to palpation lower quadrant, positive nonreducible hernia 4 x 4 cm in size, no lower extremity edema.   Sodium 134, potassium 3.8, chloride 97, bicarb 28, glucose 133, BUN 11, creatinine 0.89, white count 12.7, hemoglobin 15.8, hematocrit 49.0, platelets 420. SARS COVID-19 negative.   CT of the abdomen with dilated small bowel within the lower abdominal ventral hernia with resultant small bowel obstruction.  Findings consistent for strangulated or incarcerated hernia.  Positive mesenteric edema.  No pneumatosis or free air.   EKG 73 bpm, normal axis, normal intervals, sinus rhythm, Q-wave lead II-III-aVF, no significant ST segment or T wave changes.   Patient underwent urgent exploratory laparotomy with hernia repair.  Had post-operative ileus requiring NG tube.   Recovered bowel function, NG tube removed with good toleration.    Assessment & Plan:   Principal Problem:   Bowel obstruction (HCC) Active Problems:   Emphysema lung ([place])   Incarcerated hernia   Abdominal pain   Ventral  hernia with bowel obstruction   Incarcerated ventral hernia with small bowel obstruction.  SP exploratory laparotomy with primary repair of incarcerated incisional hernia.   NG tube has been removed and diet has been advanced with good toleration.   Plan to discontinue IV fluids Continue as needed analgesics and antiemetics. Change pantoprazole to po Continue out of bed to chair tid and ambulate. PT and OT evaluation.  Perioperative antibiotic therapy per surgery recommendations with ciprofloxacin and metronidazole.      2. Hyponatremia, hypokalemia and hyponatremia.  Electrolytes have been corrected with K at 4,0 and serum bicarbonate at 24. Renal function with serum cr at 0,58 Hold on IV fluids, patient is tolerating po diet.   3. COPD No signs of clinical exacerbation.    4. Reactive leukocytosis.  Leukocytosis has resolved with wbc today at 8,9   5. HTN.  Blood pressure systolic 062 mmHg, will resume amlodipine for blood pressure control.      Status is: Inpatient  Remains inpatient appropriate because:Inpatient level of care appropriate due to severity of illness  Dispo: The patient is from: Home              Anticipated d/c is to: Home              Patient currently is not medically stable to d/c.   Difficult to place patient No  DVT prophylaxis: Enoxaparin   Code Status:    full  Family Communication:   No family at the bedside      Consultants:  Surgery   Procedures:  Exploratory laparotomy with hernia repair  Subjective: Patient with no nausea or vomiting, NG tube has been removed  with improvement in nasopharyngeal pain, no chest pain or dyspnea.   Objective: Vitals:   09/02/21 1418 09/02/21 2012 09/03/21 0422 09/03/21 0437  BP: (!) 161/57 (!) 152/68 (!) 169/66   Pulse: 72 70 63   Resp: 20 20 16    Temp: 100 F (37.8 C) 99.9 F (37.7 C) 99.1 F (37.3 C)   TempSrc: Oral Oral Oral   SpO2: 97% 95% 93%   Weight:    85.2 kg  Height:         Intake/Output Summary (Last 24 hours) at 09/03/2021 1155 Last data filed at 09/03/2021 0531 Gross per 24 hour  Intake 3351.43 ml  Output 955 ml  Net 2396.43 ml   Filed Weights   09/01/21 0411 09/02/21 0500 09/03/21 0437  Weight: 82 kg 86.4 kg 85.2 kg    Examination:   General: Not in pain or dyspnea.  Neurology: Awake and alert, non focal  E ENT: mild pallor, no icterus, oral mucosa moist Cardiovascular: No JVD. S1-S2 present, rhythmic, no gallops, rubs, or murmurs. No lower extremity edema. Pulmonary:  positive breath sounds bilaterally, with no wheezing, rhonchi or rales. Gastrointestinal. Abdomen soft and non tender, surgical wound with dressing in place. Drain in place.  Skin. No rashes Musculoskeletal: no joint deformities     Data Reviewed: I have personally reviewed following labs and imaging studies  CBC: Recent Labs  Lab 08/31/21 1414 09/01/21 0500 09/02/21 0541 09/03/21 0530  WBC 12.7* 13.3* 10.4 8.9  NEUTROABS 10.6*  --   --  5.5  HGB 15.8* 13.8 12.0 12.0  HCT 49.0* 42.8 38.8 38.2  MCV 92.6 93.7 97.2 95.5  PLT 420* 347 293 010   Basic Metabolic Panel: Recent Labs  Lab 08/31/21 1414 09/01/21 0500 09/02/21 0541 09/03/21 0530  NA 134* 138 138 140  K 3.8 3.7 3.6 4.0  CL 97* 106 106 109  CO2 28 27 25 24   GLUCOSE 133* 138* 83 103*  BUN 11 10 9  7*  CREATININE 0.89 0.82 0.80 0.58  CALCIUM 10.3 9.6 8.7* 9.6  MG  --  1.7 2.2  --   PHOS  --  3.9  --   --    GFR: Estimated Creatinine Clearance: 66.7 mL/min (by C-G formula based on SCr of 0.58 mg/dL). Liver Function Tests: Recent Labs  Lab 08/31/21 1414 09/01/21 0500  AST 19 18  ALT 12 11  ALKPHOS 69 53  BILITOT 1.4* 1.0  PROT 6.7 5.3*  ALBUMIN 3.8 2.8*   Recent Labs  Lab 08/31/21 1414  LIPASE 34   No results for input(s): AMMONIA in the last 168 hours. Coagulation Profile: Recent Labs  Lab 09/01/21 0500  INR 1.0   Cardiac Enzymes: No results for input(s): CKTOTAL, CKMB,  CKMBINDEX, TROPONINI in the last 168 hours. BNP (last 3 results) No results for input(s): PROBNP in the last 8760 hours. HbA1C: No results for input(s): HGBA1C in the last 72 hours. CBG: No results for input(s): GLUCAP in the last 168 hours. Lipid Profile: No results for input(s): CHOL, HDL, LDLCALC, TRIG, CHOLHDL, LDLDIRECT in the last 72 hours. Thyroid Function Tests: No results for input(s): TSH, T4TOTAL, FREET4, T3FREE, THYROIDAB in the last 72 hours. Anemia Panel: No results for input(s): VITAMINB12, FOLATE, FERRITIN, TIBC, IRON, RETICCTPCT in the last 72 hours.    Radiology Studies: I have reviewed all of the imaging during this hospital visit personally     Scheduled Meds:  Chlorhexidine Gluconate Cloth  6 each Topical Daily   enoxaparin (  LOVENOX) injection  40 mg Subcutaneous Q24H   mouth rinse  15 mL Mouth Rinse BID   Continuous Infusions:  sodium chloride 50 mL/hr at 09/02/21 2255   ciprofloxacin 200 mg (09/03/21 0258)   metronidazole 500 mg (09/03/21 0420)     LOS: 3 days        Americus Scheurich Gerome Apley, MD

## 2021-09-04 LAB — BASIC METABOLIC PANEL
Anion gap: 4 — ABNORMAL LOW (ref 5–15)
BUN: 8 mg/dL (ref 8–23)
CO2: 25 mmol/L (ref 22–32)
Calcium: 9.5 mg/dL (ref 8.9–10.3)
Chloride: 109 mmol/L (ref 98–111)
Creatinine, Ser: 0.63 mg/dL (ref 0.44–1.00)
GFR, Estimated: >60 mL/min (ref >60)
Glucose, Bld: 118 mg/dL — ABNORMAL HIGH (ref 70–99)
Potassium: 3.5 mmol/L (ref 3.5–5.1)
Sodium: 138 mmol/L (ref 135–145)

## 2021-09-04 LAB — SURGICAL PATHOLOGY

## 2021-09-04 NOTE — Progress Notes (Signed)
Pt was reeducated about the drain and pt did demonstrated it.  Pt wound is covered with gauze and taped it.pt was given some more gauze to use at home.Explained about the discharge instruction. No questions or concerns. Marland Kitchen

## 2021-09-04 NOTE — Discharge Instructions (Signed)
[facility]      [facility] [phone]  OPEN ABDOMINAL SURGERY: POST OP INSTRUCTIONS  Always review your discharge instruction sheet given to you by the facility where your surgery was performed.  IF YOU HAVE DISABILITY OR FAMILY LEAVE FORMS, YOU MUST BRING THEM TO THE OFFICE FOR PROCESSING.  PLEASE DO NOT GIVE THEM TO YOUR DOCTOR.  A prescription for pain medication may be given to you upon discharge.  Take your pain medication as prescribed, if needed.  If narcotic pain medicine is not needed, then you may take acetaminophen (Tylenol) or ibuprofen (Advil) as needed. Take your usually prescribed medications unless otherwise directed. If you need a refill on your pain medication, please contact your pharmacy. They will contact our office to request authorization.  Prescriptions will not be filled after 5pm or on week-ends. You should follow a light diet the first few days after arrival home, such as soup and crackers, pudding, etc.unless your doctor has advised otherwise. A high-fiber, low fat diet can be resumed as tolerated.   Be sure to include lots of fluids daily. Most patients will experience some swelling and bruising on the chest and neck area.  Ice packs will help.  Swelling and bruising can take several days to resolve Most patients will experience some swelling and bruising in the area of the incision. Ice pack will help. Swelling and bruising can take several days to resolve..  It is common to experience some constipation if taking pain medication after surgery.  Increasing fluid intake and taking a stool softener will usually help or prevent this problem from occurring.  A mild laxative (Milk of Magnesia or Miralax) should be taken according to package directions if there are no bowel movements after 48 hours.  You may have steri-strips (small skin tapes) in place directly over the incision.  These strips should be left on the skin for 7-10 days.  If your surgeon used skin  glue on the incision, you may shower in 24 hours.  The glue will flake off over the next 2-3 weeks.  Any sutures or staples will be removed at the office during your follow-up visit. You may find that a light gauze bandage over your incision may keep your staples from being rubbed or pulled. You may shower and replace the bandage daily. ACTIVITIES:  You may resume regular (light) daily activities beginning the next day--such as daily self-care, walking, climbing stairs--gradually increasing activities as tolerated.  You may have sexual intercourse when it is comfortable.  Refrain from any heavy lifting or straining until approved by your doctor. You may drive when you no longer are taking prescription pain medication, you can comfortably wear a seatbelt, and you can safely maneuver your car and apply brakes Return to Work: ___________________________________ You should see your doctor in the office for a follow-up appointment approximately two weeks after your surgery.  Make sure that you call for this appointment within a day or two after you arrive home to insure a convenient appointment time. OTHER INSTRUCTIONS:  _____________________________________________________________ _____________________________________________________________  WHEN TO CALL YOUR DOCTOR: Fever over 101.0 Inability to urinate Nausea and/or vomiting Extreme swelling or bruising Continued bleeding from incision. Increased pain, redness, or drainage from the incision. Difficulty swallowing or breathing Muscle cramping or spasms. Numbness or tingling in hands or feet or around lips.  The clinic staff is available to answer your questions during regular business hours.  Please don't hesitate to call and ask to speak to one of   the nurses if you have concerns.  For further questions, please visit [email or web address]  

## 2021-09-04 NOTE — Discharge Summary (Signed)
Physician Discharge Summary  [name]:[id number] DOB: 1941/03/13 DOA: 08/31/2021  PCP: Christina Liberty, MD  Admit date: 08/31/2021 Discharge date: 09/04/2021  Admitted From: Home  Disposition:  Home   Recommendations for Outpatient Follow-up and new medication changes:  Follow up with Dr. Katherine Roan in 7 to 10 days.  Patient will keep drain in place until follow up with surgery as outpatient for staple removal.   Home Health: no   Equipment/Devices: no    Discharge Condition: stable  CODE STATUS: full  Diet recommendation:  heart healthy   Brief/Interim Summary: Christina Branch was admitted to the hospital with the working diagnosis of acute bowel obstruction due to incarcerated incisional hernia.  Now sp exploratory laparotomy with primary hernia repair.    80 yo female with the past medical history of prior cholecystectomy and partial resection of the colon,GERD, dyslipidemia, HTN, and peripheral vascular disease, who presented with abdominal pain, nausea and vomiting.  Reported 24 hours of persistent and worsening symptoms.  She noted a bulge on the right lower quadrant of her abdomen. On her initial physical examination blood pressure 145/83, heart rate 80, respiratory rate 18, temperature 98.9, oxygen saturation 96%, her lungs were clear to auscultation bilaterally, heart S1-S2, present, rhythmic, the abdomen was distended, tender to palpation lower quadrant, positive nonreducible hernia 4 x 4 cm in size, no lower extremity edema.   Sodium 134, potassium 3.8, chloride 97, bicarb 28, glucose 133, BUN 11, creatinine 0.89, white count 12.7, hemoglobin 15.8, hematocrit 49.0, platelets 420. SARS COVID-19 negative.   CT of the abdomen with dilated small bowel within the lower abdominal ventral hernia with resultant small bowel obstruction.  Findings consistent for strangulated or incarcerated hernia.  Positive mesenteric edema.  No pneumatosis or free air.   EKG 73 bpm, normal  axis, normal intervals, sinus rhythm, Q-wave lead II-III-aVF, no significant ST segment or T wave changes.   Patient underwent urgent exploratory laparotomy with hernia repair.  Had post-operative ileus requiring NG tube.    Recovered bowel function, NG tube removed with good toleration.  Patient will follow as outpatient.   Incarcerated ventral hernia with small bowel obstruction. Patient was admitted to the medical ward, she underwent emergent exploratory laparotomy with primary repair of incarcerated incisional hernia. Postoperatively she developed a ileus, required nasogastric tube to low intermittent suction. She received supportive medical therapy with intravenous fluids, as needed analgesics and antiemetics.  Positive recover bowel function, nasogastric tube was removed and diet was advanced. Patient received perioperative antibiotic therapy with ciprofloxacin and metronidazole. JP drain in place.  2.  Hyponatremia, hypokalemia.  Patient received intravenous fluids with improvement of electrolytes. At her discharge sodium 138, potassium 3.5, chloride 109, bicarb 25, glucose 118, BUN 8, creatinine 0.63. She is tolerating diet adequately.  3.  COPD.  No signs of acute exacerbation.  4.  Reactive leukocytosis.  Her white cell count improved, discharged 8.9. Patient received perioperative antibiotic therapy with ciprofloxacin and metronidazole.  5.  Hypertension.  Continue blood pressure control with amlodipine.    Discharge Diagnoses:  Principal Problem:   Bowel obstruction (HCC) Active Problems:   Emphysema lung (Christina Branch)   Incarcerated hernia   Abdominal pain   Ventral hernia with bowel obstruction    Discharge Instructions   Allergies as of 09/04/2021       Reactions   Penicillins Hives   Shellfish Allergy Swelling   Sulfa Antibiotics Nausea And Vomiting   Tuna [fish Allergy] Swelling  Medication List     TAKE these medications    albuterol 108 (90  Base) MCG/ACT inhaler Commonly known as: VENTOLIN HFA Inhale 1-2 puffs into the lungs every 6 (six) hours as needed.   amLODipine 10 MG tablet Commonly known as: NORVASC Take 10 mg by mouth daily.   CENTRUM SILVER PO Take 1 tablet by mouth daily.   diclofenac Sodium 1 % Gel Commonly known as: VOLTAREN Apply 2 g topically daily as needed (pain).   HYDROcodone-ibuprofen 7.5-200 MG tablet Commonly known as: VICOPROFEN Take 1 tablet by mouth every 6 (six) hours as needed for moderate pain.   loratadine 10 MG tablet Commonly known as: CLARITIN Take 10 mg by mouth daily as needed for allergies.   Vitamin D (Ergocalciferol) 1.25 MG (50000 UNIT) Caps capsule Commonly known as: DRISDOL Take 50,000 Units by mouth every 7 (seven) days.        Allergies  Allergen Reactions   Penicillins Hives   Shellfish Allergy Swelling   Sulfa Antibiotics Nausea And Vomiting   Geralyn Flash [Fish Allergy] Swelling    Consultations: Surgery    Procedures/Studies: DG Abd 1 View  Result Date: 09/01/2021 CLINICAL DATA:  80 year old female with history of nasogastric tube placement. EXAM: ABDOMEN - 1 VIEW COMPARISON:  Abdominal radiograph 08/31/2021. FINDINGS: Interval placement of nasogastric tube with tip in the antral pre-pyloric region of the stomach. Multiple dilated loops of small bowel are again noted in the central abdomen measuring up to 4.7 cm in diameter. Gas and stool are also noted throughout the visualized portions of the colon. No definite pneumoperitoneum confidently identified on this single supine image. IMPRESSION: 1. Tip of nasogastric tube is in the antral pre-pyloric region of the stomach. 2. Bowel gas pattern again suggests early or partial small bowel obstruction. Electronically Signed   By: Vinnie Langton M.D.   On: 09/01/2021 06:18   CT Abdomen Pelvis W Contrast  Result Date: 08/31/2021 CLINICAL DATA:  Evaluate for bowel obstruction.  Abdominal pain. EXAM: CT ABDOMEN AND PELVIS  WITH CONTRAST TECHNIQUE: Multidetector CT imaging of the abdomen and pelvis was performed using the standard protocol following bolus administration of intravenous contrast. CONTRAST:  61mL OMNIPAQUE IOHEXOL 350 MG/ML SOLN COMPARISON:  CT abdomen and pelvis 08/23/2020. FINDINGS: Lower chest: There is some scarring in the lung bases. Hepatobiliary: Gallbladder surgically absent. There is stable intra and extrahepatic biliary ductal dilatation. No focal liver lesions are identified. Pancreas: Unremarkable. No pancreatic ductal dilatation or surrounding inflammatory changes. Spleen: Normal in size without focal abnormality. 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 Right adrenal gland is within normal limits. There is no hydronephrosis or perinephric fat stranding. There are cortical hypodensities in both kidneys which are too small to characterize, most likely cysts. The bladder is within normal limits. Stomach/Bowel: There is a central lower ventral hernia containing dilated small bowel loops. This is transition point for small bowel obstruction. Small bowel loops proximal to this level are dilated measuring up to 3.2 cm with associated mesenteric edema. Stomach is moderately distended with air-fluid level. Distal small bowel and colon are decompressed. There is no pneumatosis or free air identified. Colon is nondilated. Appendix is within normal limits. There is colonic diverticulosis without evidence for acute diverticulitis. Vascular/Lymphatic: Aortic atherosclerosis. No enlarged abdominal or pelvic lymph nodes. 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 Adnexa are unremarkable. Other: There is a small amount of free fluid in the ventral hernia and pelvis. Musculoskeletal:  There is severe degenerative changes of the right hip. Right hip screws are present. IMPRESSION: 1. Dilated small bowel within lower abdominal  ventral hernia with resultant small bowel obstruction. Findings are concerning for strangulated or incarcerated hernia. There is mesenteric edema present which can be seen with vascular compromise. No pneumatosis or free air. 2. Small amount of free fluid in the hernia and pelvis. 3. Cholecystectomy.  Stable biliary ductal dilatation. 4. Colonic diverticulosis. 5.  Aortic Atherosclerosis (ICD10-I70.0). 6. These results were called by telephone at the time of interpretation on 08/31/2021 at 5:07 pm to provider Fredia Sorrow , who verbally acknowledged these results. Electronically Signed   By: Ronney Asters M.D.   On: 08/31/2021 17:07   DG Abd 2 Views  Result Date: 08/31/2021 CLINICAL DATA:  Abdominal pain, nausea, and vomiting. EXAM: ABDOMEN - 2 VIEW COMPARISON:  None. FINDINGS: Scattered air in the stomach, small bowel, and colon. A few dilated loops of small bowel measuring up to 4.3 cm in diameter are noted with multiple air-fluid levels on upright series. The colon is normal in caliber. Mild fecal burden. No free intraperitoneal air on upright series. Right femoral hardware is unchanged compared to prior exams. 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 Vascular calcifications in pelvis. Multilevel degenerative disc disease in the lumbar spine. IMPRESSION: A few dilated small bowel loops with air-fluid levels are concerning for possible bowel obstruction. Electronically Signed   By: Ileana Roup M.D.   On: 08/31/2021 14:16   ECHOCARDIOGRAM COMPLETE  Result Date: 08/21/2021    ECHOCARDIOGRAM REPORT   Patient Name:   Christina Branch Date of Exam: 08/21/2021 Medical Rec #:  [id number]      Height:       70.0 in Accession #:    [id number]     Weight:       179.6 lb Date of Birth:  1940-12-06       BSA:          1.994 m Patient Age:    28 years       BP:           148/70 mmHg Patient Gender: F              HR:           55 bpm. Exam Location:  [address]  Procedure: 2D Echo, 3D Echo, Cardiac Doppler and Color Doppler Indications:    R06.00 Dyspnea  History:        Patient has no prior history of Echocardiogram examinations.                 Signs/Symptoms:Edema; Risk Factors:Current Smoker, Hypertension                 and Dyslipidemia.  Sonographer:    Deliah Boston RDCS Referring Phys: Brand Males IMPRESSIONS  1. Left ventricular ejection fraction, by estimation, is 70 to 75%. Left ventricular ejection fraction by 3D volume is 72 %. The left ventricle has hyperdynamic function. The left ventricle has no regional wall motion abnormalities. Left ventricular diastolic parameters are consistent with Grade I diastolic dysfunction (impaired relaxation).  2. Right ventricular systolic function is normal. The right ventricular size is normal. There is normal pulmonary artery systolic pressure. The estimated right ventricular systolic pressure is 29.9 mmHg.  3. Left atrial size was mildly dilated.  4. The mitral valve is normal in structure. No evidence of mitral valve regurgitation. No evidence  of mitral stenosis.  5. The aortic valve is calcified. Aortic valve regurgitation is not visualized. Mild to moderate aortic valve sclerosis/calcification is present, without any evidence of aortic stenosis.  6. The inferior vena cava is normal in size with greater than 50% respiratory variability, suggesting right atrial pressure of 3 mmHg. FINDINGS  Left Ventricle: Left ventricular ejection fraction, by estimation, is 70 to 75%. Left ventricular ejection fraction by 3D volume is 72 %. The left ventricle has hyperdynamic function. The left ventricle has no regional wall motion abnormalities. The left ventricular internal cavity size was normal in size. There is no left ventricular hypertrophy. Left ventricular diastolic parameters are consistent with Grade I diastolic dysfunction (impaired relaxation). Normal left ventricular filling pressure. Right Ventricle: The right  ventricular size is normal. No increase in right ventricular wall thickness. Right ventricular systolic function is normal. There is normal pulmonary artery systolic pressure. The tricuspid regurgitant velocity is 2.78 m/s, and  with an assumed right atrial pressure of 3 mmHg, the estimated right ventricular systolic pressure is 96.2 mmHg. Left Atrium: Left atrial size was mildly dilated. Right Atrium: Right atrial size was normal in size. Pericardium: There is no evidence of pericardial effusion. Mitral Valve: The mitral valve is normal in structure. Mild mitral annular calcification. No evidence of mitral valve regurgitation. No evidence of mitral valve stenosis. Tricuspid Valve: The tricuspid valve is normal in structure. Tricuspid valve regurgitation is trivial. No evidence of tricuspid stenosis. Aortic Valve: The aortic valve is calcified. Aortic valve regurgitation is not visualized. Mild to moderate aortic valve sclerosis/calcification is present, without any evidence of aortic stenosis. Pulmonic Valve: The pulmonic valve was normal in structure. Pulmonic valve regurgitation is not visualized. No evidence of pulmonic stenosis. Aorta: The aortic root is normal in size and structure. Venous: The inferior vena cava is normal in size with greater than 50% respiratory variability, suggesting right atrial pressure of 3 mmHg. IAS/Shunts: No atrial level shunt detected by color flow Doppler.  LEFT VENTRICLE PLAX 2D LVIDd:         3.80 cm         Diastology LVIDs:         1.80 cm         LV e' medial:    6.53 cm/s LV PW:         0.80 cm         LV E/e' medial:  11.6 LV IVS:        0.90 cm         LV e' lateral:   13.70 cm/s LVOT diam:     2.20 cm         LV E/e' lateral: 5.5 LV SV:         92 LV SV Index:   46 LVOT Area:     3.80 cm        3D Volume EF                                LV 3D EF:    Left                                             ventricul  ar                                              ejection                                             fraction                                             by 3D                                             volume is                                             72 %.                                 3D Volume EF:                                3D EF:        72 %                                LV EDV:       151 ml                                LV ESV:       42 ml                                LV SV:        110 ml RIGHT VENTRICLE RV S prime:     13.20 cm/s TAPSE (M-mode): 1.9 cm LEFT ATRIUM             Index       RIGHT ATRIUM           Index LA diam:        3.20 cm 1.60 cm/m  RA Area:     19.60 cm LA Vol (A2C):   65.8 ml 33.00 ml/m RA Volume:   54.00 ml  27.08 ml/m LA Vol (A4C):   68.4 ml 34.30 ml/m LA Biplane Vol: 70.2 ml 35.21 ml/m  AORTIC VALVE LVOT Vmax:   99.70 cm/s LVOT Vmean:  68.600 cm/s LVOT VTI:    0.243 m  AORTA Ao Root diam: 3.10 cm Ao Asc diam:  3.45 cm MITRAL VALVE                TRICUSPID VALVE MV Area (PHT): cm  TR Peak grad:   30.9 mmHg MV Decel Time: 280 msec     TR Vmax:        278.00 cm/s MV E velocity: 75.65 cm/s MV A velocity: 110.50 cm/s  SHUNTS MV E/A ratio:  0.68         Systemic VTI:  0.24 m                             Systemic Diam: 2.20 cm Fransico Him MD Electronically signed by Fransico Him MD Signature Date/Time: 08/21/2021/3:50:22 PM    Final      Procedures: hernia repair   Subjective: Patient is feeling well, no nausea or vomiting, no dyspnea or chest pain.   Discharge Exam: Vitals:   09/04/21 0900 09/04/21 0905  BP: (!) 162/68 (!) 145/65  Pulse: 66   Resp: 20   Temp:    SpO2:     Vitals:   09/03/21 2233 09/04/21 0546 09/04/21 0900 09/04/21 0905  BP: (!) 151/54 (!) 151/63 (!) 162/68 (!) 145/65  Pulse: 70 77 66   Resp: 18 18 20    Temp:  99 F (37.2 C)    TempSrc:  Oral    SpO2:  96%    Weight:      Height:        General: Not in pain or dyspnea  Neurology: Awake  and alert, non focal  E ENT: mild pallor, no icterus, oral mucosa moist Cardiovascular: No JVD. S1-S2 present, rhythmic, no gallops, rubs, or murmurs. No lower extremity edema. Pulmonary: positive breath sounds bilaterally, adequate air movement, no wheezing, rhonchi or rales. Gastrointestinal. Abdomen soft and non tender Skin. No rashes Musculoskeletal: no joint deformities   The results of significant diagnostics from this hospitalization (including imaging, microbiology, ancillary and laboratory) are listed below for reference.     Microbiology: Recent Results (from the past 240 hour(s))  Resp Panel by RT-PCR (Flu A&B, Covid) Nasopharyngeal Swab     Status: None   Collection Time: 08/31/21  8:00 PM   Specimen: Nasopharyngeal Swab; Nasopharyngeal(NP) swabs in vial transport medium  Result Value Ref Range Status   SARS Coronavirus 2 by RT PCR NEGATIVE NEGATIVE Final    Comment: (NOTE) SARS-CoV-2 target nucleic acids are NOT DETECTED.  The SARS-CoV-2 RNA is generally detectable in upper respiratory specimens during the acute phase of infection. The lowest concentration of SARS-CoV-2 viral copies this assay can detect is 138 copies/mL. A negative result does not preclude SARS-Cov-2 infection and should not be used as the sole basis for treatment or other patient management decisions. A negative result may occur with  improper specimen collection/handling, submission of specimen other than nasopharyngeal swab, presence of viral mutation(s) within the areas targeted by this assay, and inadequate number of viral copies(<138 copies/mL). A negative result must be combined with clinical observations, patient history, and epidemiological information. The expected result is Negative.  Fact Sheet for Patients:  EntrepreneurPulse.com.au  Fact Sheet for Healthcare Providers:  IncredibleEmployment.be  This test is no t yet approved or cleared by the  [place] FDA and  has been authorized for detection and/or diagnosis of SARS-CoV-2 by FDA under an Emergency Use Authorization (EUA). This EUA will remain  in effect (meaning this test can be used) for the duration of the COVID-19 declaration under Section 564(b)(1) of the Act, 21 U.S.C.section 360bbb-3(b)(1), unless the authorization is terminated  or revoked sooner.       Influenza  A by PCR NEGATIVE NEGATIVE Final   Influenza B by PCR NEGATIVE NEGATIVE Final    Comment: (NOTE) The Xpert Xpress SARS-CoV-2/FLU/RSV plus assay is intended as an aid in the diagnosis of influenza from Nasopharyngeal swab specimens and should not be used as a sole basis for treatment. Nasal washings and aspirates are unacceptable for Xpert Xpress SARS-CoV-2/FLU/RSV testing.  Fact Sheet for Patients: EntrepreneurPulse.com.au  Fact Sheet for Healthcare Providers: IncredibleEmployment.be  This test is not yet approved or cleared by the [place] FDA and has been authorized for detection and/or diagnosis of SARS-CoV-2 by FDA under an Emergency Use Authorization (EUA). This EUA will remain in effect (meaning this test can be used) for the duration of the COVID-19 declaration under Section 564(b)(1) of the Act, 21 U.S.C. section 360bbb-3(b)(1), unless the authorization is terminated or revoked.  Performed at [facility], [place] [address], [place], [place] [address]      Labs: BNP (last 3 results) No results for input(s): BNP in the last 8760 hours. Basic Metabolic Panel: Recent Labs  Lab 08/31/21 1414 09/01/21 0500 09/02/21 0541 09/03/21 0530 09/04/21 0514  NA 134* 138 138 140 138  K 3.8 3.7 3.6 4.0 3.5  CL 97* 106 106 109 109  CO2 28 27 25 24 25   GLUCOSE 133* 138* 83 103* 118*  BUN 11 10 9  7* 8  CREATININE 0.89 0.82 0.80 0.58 0.63  CALCIUM 10.3 9.6 8.7* 9.6 9.5  MG  --  1.7 2.2  --   --   PHOS  --  3.9  --   --   --     Liver Function Tests: Recent Labs  Lab 08/31/21 1414 09/01/21 0500  AST 19 18  ALT 12 11  ALKPHOS 69 53  BILITOT 1.4* 1.0  PROT 6.7 5.3*  ALBUMIN 3.8 2.8*   Recent Labs  Lab 08/31/21 1414  LIPASE 34   No results for input(s): AMMONIA in the last 168 hours. CBC: Recent Labs  Lab 08/31/21 1414 09/01/21 0500 09/02/21 0541 09/03/21 0530  WBC 12.7* 13.3* 10.4 8.9  NEUTROABS 10.6*  --   --  5.5  HGB 15.8* 13.8 12.0 12.0  HCT 49.0* 42.8 38.8 38.2  MCV 92.6 93.7 97.2 95.5  PLT 420* 347 293 303   Cardiac Enzymes: No results for input(s): CKTOTAL, CKMB, CKMBINDEX, TROPONINI in the last 168 hours. BNP: Invalid input(s): POCBNP CBG: No results for input(s): GLUCAP in the last 168 hours. D-Dimer No results for input(s): DDIMER in the last 72 hours. Hgb A1c No results for input(s): HGBA1C in the last 72 hours. Lipid Profile No results for input(s): CHOL, HDL, LDLCALC, TRIG, CHOLHDL, LDLDIRECT in the last 72 hours. Thyroid function studies No results for input(s): TSH, T4TOTAL, T3FREE, THYROIDAB in the last 72 hours.  Invalid input(s): FREET3 Anemia work up No results for input(s): VITAMINB12, FOLATE, FERRITIN, TIBC, IRON, RETICCTPCT in the last 72 hours. Urinalysis No results found for: COLORURINE, APPEARANCEUR, [place], [place], GLUCOSEU, [place], [place], [place], PROTEINUR, UROBILINOGEN, NITRITE, LEUKOCYTESUR Sepsis Labs Invalid input(s): PROCALCITONIN,  WBC,  LACTICIDVEN Microbiology Recent Results (from the past 240 hour(s))  Resp Panel by RT-PCR (Flu A&B, Covid) Nasopharyngeal Swab     Status: None   Collection Time: 08/31/21  8:00 PM   Specimen: Nasopharyngeal Swab; Nasopharyngeal(NP) swabs in vial transport medium  Result Value Ref Range Status   SARS Coronavirus 2 by RT PCR NEGATIVE NEGATIVE Final    Comment: (NOTE) SARS-CoV-2 target nucleic acids are NOT DETECTED.  The SARS-CoV-2 RNA is generally detectable in upper respiratory specimens during the  acute phase of infection. The lowest concentration of SARS-CoV-2 viral copies this assay can detect is 138 copies/mL. A negative result does not preclude SARS-Cov-2 infection and should not be used as the sole basis for treatment or other patient management decisions. A negative result may occur with  improper specimen collection/handling, submission of specimen other than nasopharyngeal swab, presence of viral mutation(s) within the areas targeted by this assay, and inadequate number of viral copies(<138 copies/mL). A negative result must be combined with clinical observations, patient history, and epidemiological information. The expected result is Negative.  Fact Sheet for Patients:  EntrepreneurPulse.com.au  Fact Sheet for Healthcare Providers:  IncredibleEmployment.be  This test is no t yet approved or cleared by the [place] FDA and  has been authorized for detection and/or diagnosis of SARS-CoV-2 by FDA under an Emergency Use Authorization (EUA). This EUA will remain  in effect (meaning this test can be used) for the duration of the COVID-19 declaration under Section 564(b)(1) of the Act, 21 U.S.C.section 360bbb-3(b)(1), unless the authorization is terminated  or revoked sooner.       Influenza A by PCR NEGATIVE NEGATIVE Final   Influenza B by PCR NEGATIVE NEGATIVE Final    Comment: (NOTE) The Xpert Xpress SARS-CoV-2/FLU/RSV plus assay is intended as an aid in the diagnosis of influenza from Nasopharyngeal swab specimens and should not be used as a sole basis for treatment. Nasal washings and aspirates are unacceptable for Xpert Xpress SARS-CoV-2/FLU/RSV testing.  Fact Sheet for Patients: EntrepreneurPulse.com.au  Fact Sheet for Healthcare Providers: IncredibleEmployment.be  This test is not yet approved or cleared by the [place] FDA and has been authorized for detection and/or  diagnosis of SARS-CoV-2 by FDA under an Emergency Use Authorization (EUA). This EUA will remain in effect (meaning this test can be used) for the duration of the COVID-19 declaration under Section 564(b)(1) of the Act, 21 U.S.C. section 360bbb-3(b)(1), unless the authorization is terminated or revoked.  Performed at [facility], [place] [address], [place], [place] [address]      Time coordinating discharge: 45 minutes  SIGNED:   Tawni Millers, MD  [facility] 09/04/2021, 11:16 AM

## 2021-09-04 NOTE — Care Management Important Message (Signed)
Important Message  Patient Details IM Letter given to the Patient. Name: Christina Branch MRN: [id number] Date of Birth: January 18, 1941   Medicare Important Message Given:  Yes     Kerin Salen 09/04/2021, 11:27 AM

## 2022-01-24 ENCOUNTER — Ambulatory Visit: Payer: Self-pay

## 2022-01-24 ENCOUNTER — Encounter: Payer: Self-pay | Admitting: Orthopedic Surgery

## 2022-01-24 ENCOUNTER — Other Ambulatory Visit: Payer: Self-pay

## 2022-01-24 ENCOUNTER — Ambulatory Visit: Payer: Medicare Other | Admitting: Surgical

## 2022-01-24 DIAGNOSIS — M1711 Unilateral primary osteoarthritis, right knee: Secondary | ICD-10-CM | POA: Diagnosis not present

## 2022-01-24 DIAGNOSIS — M1712 Unilateral primary osteoarthritis, left knee: Secondary | ICD-10-CM

## 2022-01-24 DIAGNOSIS — M25561 Pain in right knee: Secondary | ICD-10-CM | POA: Diagnosis not present

## 2022-01-24 DIAGNOSIS — M25562 Pain in left knee: Secondary | ICD-10-CM

## 2022-01-24 DIAGNOSIS — G8929 Other chronic pain: Secondary | ICD-10-CM

## 2022-01-24 DIAGNOSIS — M170 Bilateral primary osteoarthritis of knee: Secondary | ICD-10-CM

## 2022-01-24 MED ORDER — LIDOCAINE HCL 1 % IJ SOLN
5.0000 mL | INTRAMUSCULAR | Status: AC | PRN
  Administered 2022-01-24: 5 mL

## 2022-01-24 MED ORDER — METHYLPREDNISOLONE ACETATE 40 MG/ML IJ SUSP
40.0000 mg | INTRAMUSCULAR | Status: AC | PRN
Start: 2022-01-24 — End: 2022-01-24
  Administered 2022-01-24: 40 mg via INTRA_ARTICULAR

## 2022-01-24 MED ORDER — METHYLPREDNISOLONE ACETATE 40 MG/ML IJ SUSP
40.0000 mg | INTRAMUSCULAR | Status: AC | PRN
  Administered 2022-01-24: 40 mg via INTRA_ARTICULAR

## 2022-01-24 MED ORDER — BUPIVACAINE HCL 0.25 % IJ SOLN
4.0000 mL | INTRAMUSCULAR | Status: AC | PRN
Start: 2022-01-24 — End: 2022-01-24
  Administered 2022-01-24: 4 mL via INTRA_ARTICULAR

## 2022-01-24 MED ORDER — BUPIVACAINE HCL 0.25 % IJ SOLN
4.0000 mL | INTRAMUSCULAR | Status: AC | PRN
  Administered 2022-01-24: 4 mL via INTRA_ARTICULAR

## 2022-01-24 NOTE — Progress Notes (Signed)
? ?Office Visit Note ?  ?Patient: Christina Branch           ?Date of Birth: August 23, 1941           ?MRN: [id number] ?Visit Date: 01/24/2022 ?Requested by: Vincente Liberty, MD ?[address] ?[place],  [place] [address] ?PCP: Vincente Liberty, MD ? ?Subjective: ?Chief Complaint  ?Patient presents with  ? Right Knee - Pain  ? Left Knee - Pain  ? ? ?HPI: Christina Branch is a 81 y.o. female who presents to the office complaining of bilateral knee pain right greater than left.  Patient states that she has history of chronic pain in both knees but pain has been worse in the last few weeks without injury.  She localizes pain to the posterior lateral aspect of the left knee and anterior aspect of the right knee.  No recent injury or falls.  Denies any instability or mechanical symptoms of either knee.  Pain wakes her up at night occasionally.  She feels there is increased fluid on the left knee.  She uses her arms to help stand up due to the pain and weakness she feels in both legs.  The right knee pain is more new for her as usually her left knee bothers her much more.  No history of prior surgery.  She takes hydrocodone-ibuprofen about 1-2 times per day but it is prescribed to take every 6 hours as needed.  She has stiffness that sets in with long walks more than 20 minutes.  No history of diabetes or smoking.  Does have history of hypertension and takes vitamin D supplementation..   ?             ?ROS: All systems reviewed are negative as they relate to the chief complaint within the history of present illness.  Patient denies fevers or chills. ? ?Assessment & Plan: ?Visit Diagnoses:  ?1. Bilateral primary osteoarthritis of knee   ?2. Chronic pain of both knees   ? ? ?Plan: Patient is a 81 year old female who presents for evaluation of bilateral knee pain.  She has history of bilateral knee osteoarthritis with last aspiration and injection of her knees several years ago according to the medical record.  She had good  relief from those injections and would like to try these again.  No recent injury or falls.  She did have increased pain in her hip last year that was successfully treated with injection by Dr. Ernestina Patches.  No significant pain coming from her hip currently.  She tolerated both knee injections well today and will plan to follow-up with the office as needed. ? ?Follow-Up Instructions: No follow-ups on file.  ? ?Orders:  ?Orders Placed This Encounter  ?Procedures  ? XR Knee 1-2 Views Right  ? XR Knee 1-2 Views Left  ? ?No orders of the defined types were placed in this encounter. ? ? ? ? Procedures: ?Large Joint Inj: bilateral knee on 01/24/2022 7:04 PM ?Indications: diagnostic evaluation, joint swelling and pain ?Details: 18 G 1.5 in needle, superolateral approach ? ?Arthrogram: No ? ?Medications (Right): 5 mL lidocaine 1 %; 4 mL bupivacaine 0.25 %; 40 mg methylPREDNISolone acetate 40 MG/ML ?Aspirate (Right): 25 mL ?Medications (Left): 5 mL lidocaine 1 %; 4 mL bupivacaine 0.25 %; 40 mg methylPREDNISolone acetate 40 MG/ML ?Aspirate (Left): 35 mL ?Outcome: tolerated well, no immediate complications ?Procedure, treatment alternatives, risks and benefits explained, specific risks discussed. Consent was given by the patient. Immediately prior to procedure a time  out was called to verify the correct patient, procedure, equipment, support staff and site/side marked as required. Patient was prepped and draped in the usual sterile fashion.  ? ? ? ? ?Clinical Data: ?No additional findings. ? ?Objective: ?Vital Signs: There were no vitals taken for this visit. ? ?Physical Exam:  ?Constitutional: Patient appears well-developed ?HEENT:  ?Head: Normocephalic ?Eyes:EOM are normal ?Neck: Normal range of motion ?Cardiovascular: Normal rate ?Pulmonary/chest: Effort normal ?Neurologic: Patient is alert ?Skin: Skin is warm ?Psychiatric: Patient has normal mood and affect ? ?Ortho Exam: Ortho exam demonstrates left knee with 3 degrees extension  and 110 degrees of knee flexion.  Right knee with 5 degrees extension 115 degrees of knee flexion.  Effusions in both knees with left knee effusion greater than the right.  Pain with hip range of motion mostly of the right hip with decreased internal rotation.  This does not reproduce her knee pain but rather causes groin pain.  Able to perform straight leg raise with good quad strength bilaterally.  Tenderness over the medial and lateral joint lines of both knees. ? ?Specialty Comments:  ?No specialty comments available. ? ?Imaging: ?No results found. ? ? ?PMFS History: ?Patient Active Problem List  ? Diagnosis Date Noted  ? Abdominal pain   ? Ventral hernia with bowel obstruction   ? Bowel obstruction ([place]) 08/31/2021  ? Incarcerated hernia 08/31/2021  ? Emphysema lung ([place]) 08/03/2021  ? Tobacco abuse 08/03/2021  ? Dyspnea 08/03/2021  ? Peripheral neuropathy 03/07/2020  ? Primary osteoarthritis of both knees 02/02/2020  ? Primary osteoarthritis of both hands 02/02/2020  ? Arthropathy of lumbar facet joint 02/02/2020  ? 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 DDD (degenerative disc disease), cervical 02/02/2020  ? Atherosclerosis of native artery of extremity with intermittent claudication ([place]) 08/01/2012  ? Pain in limb 07/04/2012  ? Peripheral vascular disease, unspecified ([place]) 07/04/2012  ? Intermittent claudication ([place]) 06/29/2011  ? ?Past Medical History:  ?Diagnosis Date  ? Claudication [facility])   ? DJD (degenerative joint disease) of knee   ? GERD (gastroesophageal reflux disease)   ? H/O: varicose veins   ? Right Leg  ? Hyperlipidemia   ? Hypertension   ? Hypoxemia   ? Myalgia and myositis, unspecified   ? Pain in limb   ? Peripheral neuropathy 03/07/2020  ? Varicose veins   ?  ?Family History  ?Problem Relation Age of Onset  ? Cancer Mother   ? Breast cancer Sister   ? Hypertension Son   ?  ?Past Surgical History:  ?Procedure Laterality Date  ? CHOLECYSTECTOMY    ?  Open cholecystectomy  ? COLON SURGERY    ? partial resection due to large polyps (Benign)  ? FRACTURE SURGERY  1953  ? ORIF right hip  ? HEMORRHOID SURGERY    ? INGUINAL HERNIA REPAIR N/A 08/31/2021  ? Procedure: PRIMARY REPAIR INCISIONAL HERNIA; EXCISION OF ABDOMINAL SKIN TAG;  Surgeon: Erroll Luna, MD;  Location: [facility];  Service: General;  Laterality: N/A;  ? LAPAROTOMY N/A 08/31/2021  ? Procedure: EXPLORATORY LAPAROTOMY;  Surgeon: Erroll Luna, MD;  Location: [facility];  Service: General;  Laterality: N/A;  ? ?Social History  ? ?Occupational History  ? Not on file  ?Tobacco Use  ? Smoking status: Every Day  ?  Packs/day: 0.50  ?  Years: 40.00  ?  Pack years: 20.00  ?  Types: Cigarettes  ?  Start date: 11/26/1962  ? Smokeless tobacco: Never  ?  Tobacco comments:  ?  Currently smoking 2-3 cigs a day as of 08/03/21 bnm  ?Vaping Use  ? Vaping Use: Never used  ?Substance and Sexual Activity  ? Alcohol use: No  ? Drug use: No  ? Sexual activity: Not on file  ? ? ? ? ?  ?

## 2022-01-25 ENCOUNTER — Encounter: Payer: Self-pay | Admitting: Internal Medicine

## 2022-01-25 ENCOUNTER — Ambulatory Visit (INDEPENDENT_AMBULATORY_CARE_PROVIDER_SITE_OTHER): Payer: Medicare Other | Admitting: Internal Medicine

## 2022-01-25 VITALS — BP 140/86 | HR 72 | Temp 98.5°F | Ht 71.0 in | Wt 178.4 lb

## 2022-01-25 DIAGNOSIS — R072 Precordial pain: Secondary | ICD-10-CM

## 2022-01-25 DIAGNOSIS — R0609 Other forms of dyspnea: Secondary | ICD-10-CM

## 2022-01-25 DIAGNOSIS — J438 Other emphysema: Secondary | ICD-10-CM

## 2022-01-25 DIAGNOSIS — R0789 Other chest pain: Secondary | ICD-10-CM

## 2022-01-25 DIAGNOSIS — I359 Nonrheumatic aortic valve disorder, unspecified: Secondary | ICD-10-CM

## 2022-01-25 DIAGNOSIS — Z720 Tobacco use: Secondary | ICD-10-CM | POA: Diagnosis not present

## 2022-01-25 DIAGNOSIS — I5189 Other ill-defined heart diseases: Secondary | ICD-10-CM | POA: Diagnosis not present

## 2022-01-25 NOTE — Patient Instructions (Signed)
Dyspnea on exertion ?Other emphysema ([place]) ?Grade I diastolic dysfunction ? ?-Shortness of breath is because of mild heart muscle stiffness and also mild emphysema ? ?Plan ?- Supportive care and expectant follow-up based on shared decision making ? ?Tobacco abuse ? ?-[place]ator smoking occasionally but hopeful that he will quit ultimately ? ?Plan ?- Support you quitting smoking ? ?Aortic valve calcification ?Chest wall discomfort ?Precordial pain ? ?-Given the unexplained chest wall discomfort I think you should see a cardiologist ? ?Plan ?- Refer Dr. Skeet Latch at [facility] ? ? ?Follow-up ?- Nurse practitioner in 6 months or Dr. Chase Caller ?

## 2022-01-25 NOTE — Progress Notes (Signed)
OV 07/25/2021  Subjective:  Patient ID: Christina Branch, female , DOB: Dec 02, 1940 , age 81 y.o. , MRN: [id number] , ADDRESS: [place] [address] PCP Vincente Liberty, MD Patient Care Team: Vincente Liberty, MD as PCP - General (Pulmonary Disease) Marlou Sa Tonna Corner, MD (Orthopedic Surgery)  This Provider for this visit: Treatment Team:  Attending Provider: Brand Males, MD    07/25/2021 -   Chief Complaint  Patient presents with   Consult    Pt had a cxr performed which is the reason for the referral. States that she does have complaints of SOB when she exerts herself.     HPI Christina Branch 82 y.o. -referred by Dr. Vincente Liberty for shortness of breath.  She tells me that she has insidious onset of shortness of breath for few months.  There is a specific can gather.  History is not fully forthcoming.  She definitely denies shortness of breath for few years.  Definitely denies shortness of breath for several months.  It is stable since onset.  It is very mild and only on heavy exertion there is no associated chest pain.  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  When I offered empiric Spiriva for this she declined saying she does not exert herself much and so she does not want an inhaler.  However she is willing to undergo test work-up.  She is willing to consider treatment based on test results.  There is no associated chest pain or cough orthopnea paroxysmal nocturnal dyspnea or edema.  Walking desaturation test showed a tendency to drop by 3 points.  Old blood work here showed polycythemia Chest x-ray shows hyperinflation CT scan abdomen lung image shows clear lung fields Serology work-up in the last few years is negative   SYMPTOM SCALE - 07/25/2021   O2 use ra  Shortness of Breath 0 -> 5 scale with 5 being worst (score 6 If     At rest 0  Simple tasks - showers, clothes change, eating, shaving 1  Household (dishes, doing bed, laundry) 3  Shopping 4  Walking level at own pace 3  Walking up Stairs 3  Total (30-36) Dyspnea Score 14  How bad is your cough? 0  How bad is your fatigue 3  How bad is nausea 0  How bad is vomiting?  0  How bad is diarrhea? 0  How bad is anxiety? 2  How bad is depression 0      CT Chest data     March 2021  Results for KATRINKA, Christina Branch (MRN [id number]) as of 07/25/2021 15:37  Ref. Range 1/0/2725 36:64  Cyclic Citrullin Peptide Ab Latest Units: UNITS <16  ds DNA Ab Latest Units: IU/mL 1  ENA SM Ab Ser-aCnc Latest Ref Range: <1.0 NEG AI <1.0 NEG  C3 Complement Latest Ref Range: 83 - 193 mg/dL 132  C4 Complement Latest Ref Range: 15 - 57 mg/dL 33  Ribonucleic Protein(ENA) Antibody, IgG Latest Ref Range: <1.0 NEG AI <1.0 NEG  SSA (Ro) (ENA) Antibody, IgG Latest Ref Range: <1.0 NEG AI <1.0 NEG  SSB (La) (ENA) Antibody, IgG Latest Ref Range: <1.0 NEG AI <1.0 NEG  Scleroderma (Scl-70) (ENA) Antibody, IgG Latest Ref Range: <1.0 NEG AI <1.0 NEG  Results for Christina Branch, Christina Branch (MRN [id number]) as of 07/25/2021 15:37  Ref. Range 06/17/2014 22:17  Hemoglobin Latest Ref Range: 12.0 - 15.0 g/dL 17.1 (H)    No results found.    PFT  No flowsheet data found.  Simple office walk 185 feet x  3 laps goal with forehead probe 07/25/2021    O2 used ra   Number laps completed 3   Comments about pace Avg pace   Resting Pulse Ox/HR 99% and 69/min   Final Pulse Ox/HR 96% and 109/min   Desaturated </= 88% no   Desaturated <= 3% points yes   Got Tachycardic >/= 90/min yes   Symptoms at end of test Mild dyspnea   Miscellaneous comments x     OV  TE 08/03/2021 Follow up ; Emphysema , Dyspnea  Patient returns for a 1 week follow-up.  Patient was seen last visit for a pulmonary consult.  Patient's been having shortness of breath with activity over the last 6 months.  She is an active smoker.   Has smoked for many years.  She has intermittent cough.  Patient was set up for pulmonary function testing that showed no significant airflow obstruction or restriction.  Slightly decreased diffusing capacity.  FEV1 at 95%, ratio 73, FVC 101%.,  DLCO 73%.  High-res CT chest showed mild emphysema and bronchiectasis.  Right upper lobe granuloma.  Incidental findings were thyroid nodules, atherosclerosis, biliary ductal dilation (previous cholecystectomy), left adrenal nodule and left kidney angiomyolipoma.  We discussed this in detail and advised her to follow-up with her primary care provider for further evaluation if indicated Lab work showed no evidence of anemia.  Electrolyte panel showed chronic kidney disease liver function testing was normal. Patient has been set up for 2D echo which is pending.  Patient was recommended to begin Spiriva.  Patient declined.  Today in the office we discussed all her test results.  I recommend that she begin Spiriva as well.  Patient says she does not want to be on an inhaler every day.  She feels that her shortness of breath is not that significant.  She has no significant cough or wheezing.  She says she would like to have a inhaler to use if needed on occasion but does not want an every day medication She declines a flu shot   ST/EVENTS :    Results for RADLEY, TESTON (MRN [id number]) as of 07/25/2021 15:37   Ref. Range 01/02/349 09:38  Cyclic Citrullin Peptide Ab Latest Units: UNITS <16  ds DNA Ab Latest Units: IU/mL 1  ENA SM Ab Ser-aCnc Latest Ref Range: <1.0 NEG AI <1.0 NEG  C3 Complement Latest Ref Range: 83 - 193 mg/dL 132  C4 Complement Latest Ref Range: 15 - 57 mg/dL 33  Ribonucleic Protein(ENA) Antibody, IgG Latest Ref Range: <1.0 NEG AI <1.0 NEG  SSA (Ro) (ENA) Antibody, IgG Latest Ref Range: <1.0 NEG AI <1.0 NEG  SSB (La) (ENA) Antibody, IgG Latest Ref Range: <1.0 NEG AI <1.0 NEG  Scleroderma (Scl-70) (ENA) Antibody, IgG Latest Ref Range: <1.0 NEG AI  <1.0 NEG   OV 01/25/2022   Subjective:  Patient ID: Christina Branch, female , DOB: 1941-06-18 , age 1 y.o. , MRN: [id number] , ADDRESS: [place] [phone] PCP Vincente Liberty, MD Patient Care Team: Vincente Liberty, MD as PCP - General (Pulmonary Disease) Marlou Sa Tonna Corner, MD (Orthopedic Surgery)  This Provider for this visit: Treatment Team:  Attending Provider: Brand Males, MD    01/25/2022 -   Chief Complaint  Patient presents with   Follow-up    Pt states she is about the same since last visit. States she had recent testing performed and wants to know the results.   Follow-up - Dyspnea - Emphysema mild - Smoker - Aortic valve calcification - Grade 1 diastolic dysfunction  HPI Christina Branch 81 y.o. -returns for follow-up.  I saw her in summer 2022.  She followed up with nurse practitioner.  She was given diagnosis of mild emphysema based on reduced DLCO and CT scan findings.  She refused maintenance inhaler.  She still refuses maintenance inhaler.  She still has mild exertional dyspnea.  She had an echocardiogram subsequently and it shows grade 1 diastolic dysfunction and aortic valve calcification.  Of note the CT scan of the chest did not show any coronary artery calcification.  She is now reporting some unexplained precordial heaviness.  She is not able to characterize duration of radiation or any specific features.  She continues to smoke although she is reduced.    CT Chest data  XR Knee 1-2 Views Left  Result Date: 01/24/2022 AP and lateral views of left knee reviewed.  Moderate to severe degenerative changes noted in all 3 compartments.  No fracture or dislocation noted.  There is significant erosion of the posterior aspect of the patella.  XR Knee 1-2 Views Right  Result Date: 01/24/2022 AP and lateral views of right knee reviewed.  Moderate degenerative changes of the right knee noted with joint space narrowing, sclerosis,  osteophyte formation.  No fracture or dislocation noted.     PFT  PFT Results Latest Ref Rng & Units 08/03/2021  FVC-Pre L 2.74  FVC-Predicted Pre % 101  Pre FEV1/FVC % % 73  FEV1-Pre L 2.01  FEV1-Predicted Pre % 95  DLCO uncorrected ml/min/mmHg 16.82  DLCO UNC% % 73  DLCO corrected ml/min/mmHg 16.52  DLCO COR %Predicted % 72  DLVA Predicted % 87  TLC L 5.05  TLC % Predicted % 84  RV % Predicted % 86    ECHO Sept 2022   IMPRESSIONS     1. Left ventricular ejection fraction, by estimation, is 70 to 75%. Left  ventricular ejection fraction by 3D volume is 72 %. The left ventricle has  hyperdynamic function. The left ventricle has no regional wall motion  abnormalities. Left ventricular  diastolic parameters are consistent with Grade I diastolic dysfunction  (impaired relaxation).   2. Right ventricular systolic function is normal. The right ventricular  size is normal. There is normal pulmonary artery systolic pressure. The  estimated right ventricular systolic pressure is 25.6 mmHg.   3. Left atrial size was mildly dilated.   4. The mitral valve is normal in structure. No evidence of mitral valve  regurgitation. No evidence of mitral stenosis.   5. The aortic valve is calcified. Aortic valve regurgitation is not  visualized. Mild to moderate aortic valve sclerosis/calcification is  present, without any evidence of aortic stenosis.   6. The inferior vena cava is normal in size with greater than 50%  respiratory variability, suggesting right atrial pressure of 3 mmHg.    has a past medical history of Claudication [facility]), DJD (degenerative joint disease) of knee, GERD (gastroesophageal reflux disease), H/O: varicose veins, Hyperlipidemia, Hypertension, Hypoxemia, Myalgia and myositis, unspecified, Pain in limb, Peripheral neuropathy (03/07/2020), and Varicose veins.   reports that she has been smoking cigarettes. She started smoking about 59 years ago. She has a 20.00  pack-year smoking history. She has never  used smokeless tobacco.  Past Surgical History:  Procedure Laterality Date   CHOLECYSTECTOMY     Open cholecystectomy   COLON SURGERY     partial resection due to large polyps (Benign)   FRACTURE SURGERY  1953   ORIF right hip   HEMORRHOID SURGERY     INGUINAL HERNIA REPAIR N/A 08/31/2021   Procedure: PRIMARY REPAIR INCISIONAL HERNIA; EXCISION OF ABDOMINAL SKIN TAG;  Surgeon: Erroll Luna, MD;  Location: [facility];  Service: General;  Laterality: N/A;   LAPAROTOMY N/A 08/31/2021   Procedure: EXPLORATORY LAPAROTOMY;  Surgeon: Erroll Luna, MD;  Location: [facility];  Service: General;  Laterality: N/A;    Allergies  Allergen Reactions   Penicillins Hives   Shellfish Allergy Swelling   Sulfa Antibiotics Nausea And Vomiting   Tuna [Fish Allergy] Swelling    Immunization History  Administered Date(s) Administered   PFIZER(Purple Top)SARS-COV-2 Vaccination 01/29/2020, 02/24/2020, 09/24/2020    Family History  Problem Relation Age of Onset   Cancer Mother    Breast cancer Sister    Hypertension Son      Current Outpatient Medications:    amLODipine (NORVASC) 10 MG tablet, Take 10 mg by mouth daily. , Disp: , Rfl:    diclofenac Sodium (VOLTAREN) 1 % GEL, Apply 2 g topically daily as needed (pain)., Disp: , Rfl:    HYDROcodone-ibuprofen (VICOPROFEN) 7.5-200 MG per tablet, Take 1 tablet by mouth every 6 (six) hours as needed for moderate pain. , Disp: , Rfl:    loratadine (CLARITIN) 10 MG tablet, Take 10 mg by mouth daily as needed for allergies., Disp: , Rfl: 4   Multiple Vitamins-Minerals (CENTRUM SILVER PO), Take 1 tablet by mouth daily. , Disp: , Rfl:    Vitamin D, Ergocalciferol, (DRISDOL) 50000 units CAPS capsule, Take 50,000 Units by mouth every 7 (seven) days., Disp: , Rfl: 4   albuterol (VENTOLIN HFA) 108 (90 Base) MCG/ACT inhaler, Inhale 1-2 puffs into the lungs every 6 (six) hours as needed. (Patient not taking: Reported on  08/31/2021), Disp: 8 g, Rfl: 2      Objective:   Vitals:   01/25/22 1011  BP: 140/86  Pulse: 72  Temp: 98.5 F (36.9 C)  TempSrc: Oral  SpO2: 98%  Weight: 178 lb 6.4 oz (80.9 kg)  Height: 5\' 11"  (1.803 m)    Estimated body mass index is 24.88 kg/m as calculated from the following:   Height as of this encounter: 5\' 11"  (1.803 m).   Weight as of this encounter: 178 lb 6.4 oz (80.9 kg).  @WEIGHTCHANGE @  [employer]   01/25/22 1011  Weight: 178 lb 6.4 oz (80.9 kg)     Physical Exam   General: No distress. Looks wwell Neuro: Alert and Oriented x 3. GCS 15. Speech normal Psych: Pleasant Resp:  Barrel Chest - no.  Wheeze - no, Crackles - no, No overt respiratory distress CVS: Normal heart sounds. Murmurs - no Ext: Stigmata of Connective Tissue Disease - no HEENT: Normal upper airway. PEERL +. No post nasal drip        Assessment:       ICD-10-CM   1. Dyspnea on exertion  R06.09     2. Other emphysema ([place])  J43.8     3. Grade I diastolic dysfunction  W54.62     4. Tobacco abuse  Z72.0     5. Aortic valve calcification  I35.9 Ambulatory referral to Cardiology    6. Chest wall discomfort  R07.89 Ambulatory referral to  Cardiology    7. Precordial pain  R07.2 Ambulatory referral to Cardiology         Plan:     Patient Instructions  Dyspnea on exertion Other emphysema ([facility]) Grade I diastolic dysfunction  -Shortness of breath is because of mild heart muscle stiffness and also mild emphysema  Plan - Supportive care and expectant follow-up based on shared decision making  Tobacco abuse  -Gladiator smoking occasionally but hopeful that he will quit ultimately  Plan - Support you quitting smoking  Aortic valve calcification Chest wall discomfort Precordial pain  -Given the unexplained chest wall discomfort I think you should see a cardiologist  Plan - Refer Dr. Skeet Latch at [facility]   Follow-up - Nurse practitioner in 6  months or Dr. Gaye Alken    Dr. Brand Males, M.D., F.C.C.P,  Pulmonary and Critical Care Medicine Staff Physician, [place] Director - Interstitial Lung Disease  Program  [facility] [place] [facility] [place], [place], [address]  Pager: [phone], If no answer or between  15:00h - 7:00h: call [phone] Telephone: [phone]  10:36 AM 01/25/2022

## 2022-03-15 NOTE — Progress Notes (Signed)
?Cardiology Office Note:   ? ?Date:  03/19/2022  ? ?ID:  Christina Branch, DOB July 20, 1941, MRN [id number] ? ?PCP:  Vincente Liberty, MD  ?Cardiologist:  None   ? ?Referring MD: Vincente Liberty, MD  ? ?No chief complaint on file. ? ? ?History of Present Illness:   ? ?Christina Branch is a 81 y.o. female with a hx of hypertension, hyperlipidemia, emphysema, tobacco abuse, and GERD here today for the evaluation and management of aortic valve calcification and chest pain the at request of Dr. Chase Caller. She saw Dr. Chase Caller 01/2021 and reported worsening shortness of breath. The SOB was worse with exertion and she denied chest pain. She declined an inhaler. Echo 07/2021 showed LVEF 72% with grade 1 diastolic dysfunction and normal pulmonary pressures. Chest CT showed coronary and aortic atherosclerosis as well as an adrenal adenoma.  ? ?Today, she is doing well. Her bilateral beet will swell with the swelling worse in the R foot. Time and elevation improves the swelling. She is unable to lay flat because doing so causes her to feel short of breath. She has a prominent bulging vein on her L forehead. The area is not painful or tender. She has been told she snores. She admits to not exercising regularly because of arthritis pain especially in her knees and cervical spine. She has taken her blood pressure medication this morning. Usually, she takes the blood pressure medication in the afternoon. She is unable to take a fluid pill because of incontinence. She is able to walk around a store with no exertional symptoms. She primarily cooks at home and enjoys vegetables. She avoids eating fried or fast foods. She finishes a pack of cigarettes over 3 to 4 days. She denies any palpitations, chest pain, lightheadedness, headaches, syncope, or PND. ? ?Past Medical History:  ?Diagnosis Date  ? 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 Claudication [facility])   ? Coronary artery calcification 03/19/2022  ? DJD (degenerative joint disease) of knee    ? Essential hypertension 03/19/2022  ? GERD (gastroesophageal reflux disease)   ? H/O: varicose veins   ? Right Leg  ? Hyperlipidemia   ? Hypertension   ? Hypoxemia   ? Myalgia and myositis, unspecified   ? Pain in limb   ? Peripheral neuropathy 03/07/2020  ? Varicose veins   ? ? ?Past Surgical History:  ?Procedure Laterality Date  ? CHOLECYSTECTOMY    ? Open cholecystectomy  ? COLON SURGERY    ? partial resection due to large polyps (Benign)  ? FRACTURE SURGERY  1953  ? ORIF right hip  ? HEMORRHOID SURGERY    ? INGUINAL HERNIA REPAIR N/A 08/31/2021  ? Procedure: PRIMARY REPAIR INCISIONAL HERNIA; EXCISION OF ABDOMINAL SKIN TAG;  Surgeon: Erroll Luna, MD;  Location: [facility];  Service: General;  Laterality: N/A;  ? LAPAROTOMY N/A 08/31/2021  ? Procedure: EXPLORATORY LAPAROTOMY;  Surgeon: Erroll Luna, MD;  Location: [facility];  Service: General;  Laterality: N/A;  ? ? ?Current Medications: ?Current Meds  ?Medication Sig  ? amLODipine (NORVASC) 10 MG tablet Take 10 mg by mouth daily.   ? diclofenac Sodium (VOLTAREN) 1 % GEL Apply 2 g topically daily as needed (pain).  ? HYDROcodone-ibuprofen (VICOPROFEN) 7.5-200 MG per tablet Take 1 tablet by mouth every 6 (six) hours as needed for moderate pain.   ? loratadine (CLARITIN) 10 MG tablet Take 10 mg by mouth daily as needed for allergies.  ? Multiple Vitamins-Minerals (CENTRUM SILVER PO) Take 1 tablet by mouth  daily.   ? Vitamin D, Ergocalciferol, (DRISDOL) 50000 units CAPS capsule Take 50,000 Units by mouth every 7 (seven) days.  ?  ? ?Allergies:   Penicillins, Shellfish allergy, Sulfa antibiotics, and Tuna [fish allergy]  ? ?Social History  ? ?Socioeconomic History  ? Marital status: Divorced  ?  Spouse name: Not on file  ? Number of children: Not on file  ? Years of education: Not on file  ? Highest education level: Not on file  ?Occupational History  ? Not on file  ?Tobacco Use  ? Smoking status: Every Day  ?  Packs/day: 0.50  ?  Years: 40.00  ?  Pack years: 20.00  ?   Types: Cigarettes  ?  Start date: 11/26/1962  ? Smokeless tobacco: Never  ? Tobacco comments:  ?  Currently smoking 2-3 cigs occasionally as of 01/25/22  ep  ?Vaping Use  ? Vaping Use: Never used  ?Substance and Sexual Activity  ? Alcohol use: No  ? Drug use: No  ? Sexual activity: Not on file  ?Other Topics Concern  ? Not on file  ?Social History Narrative  ? Not on file  ? ?Social Determinants of Health  ? ?Financial Resource Strain: Low Risk   ? Difficulty of Paying Living Expenses: Not hard at all  ?Food Insecurity: No Food Insecurity  ? Worried About Charity fundraiser in the Last Year: Never true  ? Ran Out of Food in the Last Year: Never true  ?Transportation Needs: No Transportation Needs  ? Lack of Transportation (Medical): No  ? Lack of Transportation (Non-Medical): No  ?Physical Activity: Inactive  ? Days of Exercise per Week: 0 days  ? Minutes of Exercise per Session: 0 min  ?Stress: Not on file  ?Social Connections: Not on file  ?  ? ?Family History: ?The patient's family history includes Breast cancer in her sister; Cancer in her mother; Hypertension in her son. ? ?ROS:   ?Please see the history of present illness.    ?(+) Arthralgia ?(+) Bilateral LE swelling (R>L) ?(+) Snoring ?(+) Bilateral knee pain ?(+) Back pain. ?All other systems reviewed and negative.  ? ?EKGs/Labs/Other Studies Reviewed:   ? ?The following studies were reviewed today: ?CT Abdomen 08/31/21 ?IMPRESSION: ?1. Dilated small bowel within lower abdominal ventral hernia with ?resultant small bowel obstruction. Findings are concerning for ?strangulated or incarcerated hernia. There is mesenteric edema ?present which can be seen with vascular compromise. No pneumatosis ?or free air. ?2. Small amount of free fluid in the hernia and pelvis. ?3. Cholecystectomy.  Stable biliary ductal dilatation. ?4. Colonic diverticulosis. ?5.  Aortic Atherosclerosis (ICD10-I70.0). ?6. These results were called by telephone at the time of ?interpretation on  08/31/2021 at 5:07 pm to provider Fredia Sorrow , ?who verbally acknowledged these results. ? ?Echo 08/21/21 ?1. Left ventricular ejection fraction, by estimation, is 70 to 75%. Left  ?ventricular ejection fraction by 3D volume is 72 %. The left ventricle has  ?hyperdynamic function. The left ventricle has no regional wall motion  ?abnormalities. Left ventricular  ?diastolic parameters are consistent with Grade I diastolic dysfunction  ?(impaired relaxation).  ? 2. Right ventricular systolic function is normal. The right ventricular  ?size is normal. There is normal pulmonary artery systolic pressure. The  ?estimated right ventricular systolic pressure is 40.0 mmHg.  ? 3. Left atrial size was mildly dilated.  ? 4. The mitral valve is normal in structure. No evidence of mitral valve  ?regurgitation. No evidence of mitral stenosis.  ?  5. The aortic valve is calcified. Aortic valve regurgitation is not  ?visualized. Mild to moderate aortic valve sclerosis/calcification is  ?present, without any evidence of aortic stenosis.  ? 6. The inferior vena cava is normal in size with greater than 50%  ?respiratory variability, suggesting right atrial pressure of 3 mmHg.  ? ?EKG: ?03/19/22: Sinus rhythm rate 62 ? ?Recent Labs: ?09/01/2021: ALT 11 ?09/02/2021: Magnesium 2.2 ?09/03/2021: Hemoglobin 12.0; Platelets 303 ?09/04/2021: BUN 8; Creatinine, Ser 0.63; Potassium 3.5; Sodium 138  ? ?Recent Lipid Panel ?No results found for: CHOL, TRIG, HDL, CHOLHDL, VLDL, LDLCALC, LDLDIRECT ? ?Physical Exam:   ? ?VS:  BP (!) 156/94 (BP Location: Right Arm, Patient Position: Sitting, Cuff Size: Normal)   Pulse 62   Ht '5\' 11"'$  (1.803 m)   Wt 179 lb 9.6 oz (81.5 kg)   BMI 25.05 kg/m?  , BMI Body mass index is 25.05 kg/m?. ?GENERAL:  Well appearing ?HEENT: Pupils equal round and reactive, fundi not visualized, oral mucosa unremarkable ?NECK:  No jugular venous distention, waveform within normal limits, carotid upstroke brisk and symmetric, no  bruits, no thyromegaly ?LUNGS:  Clear to auscultation bilaterally ?HEART:  RRR.  PMI not displaced or sustained,S1 and S2 within normal limits, no S3, no S4, no clicks, no rubs, 2/6 systolic murmur at the LU

## 2022-03-19 ENCOUNTER — Encounter (HOSPITAL_BASED_OUTPATIENT_CLINIC_OR_DEPARTMENT_OTHER): Payer: Self-pay | Admitting: Cardiovascular Disease

## 2022-03-19 ENCOUNTER — Ambulatory Visit (HOSPITAL_BASED_OUTPATIENT_CLINIC_OR_DEPARTMENT_OTHER): Payer: Medicare Other | Admitting: Cardiovascular Disease

## 2022-03-19 VITALS — BP 156/94 | HR 62 | Ht 71.0 in | Wt 179.6 lb

## 2022-03-19 DIAGNOSIS — Z720 Tobacco use: Secondary | ICD-10-CM

## 2022-03-19 DIAGNOSIS — I10 Essential (primary) hypertension: Secondary | ICD-10-CM

## 2022-03-19 DIAGNOSIS — Z5181 Encounter for therapeutic drug level monitoring: Secondary | ICD-10-CM

## 2022-03-19 DIAGNOSIS — I700 Atherosclerosis of aorta: Secondary | ICD-10-CM

## 2022-03-19 DIAGNOSIS — E785 Hyperlipidemia, unspecified: Secondary | ICD-10-CM

## 2022-03-19 DIAGNOSIS — I2584 Coronary atherosclerosis due to calcified coronary lesion: Secondary | ICD-10-CM

## 2022-03-19 DIAGNOSIS — I739 Peripheral vascular disease, unspecified: Secondary | ICD-10-CM | POA: Diagnosis not present

## 2022-03-19 DIAGNOSIS — I2510 Atherosclerotic heart disease of native coronary artery without angina pectoris: Secondary | ICD-10-CM

## 2022-03-19 DIAGNOSIS — I358 Other nonrheumatic aortic valve disorders: Secondary | ICD-10-CM

## 2022-03-19 HISTORY — DX: Essential (primary) hypertension: I10

## 2022-03-19 HISTORY — DX: Other nonrheumatic aortic valve disorders: I35.8

## 2022-03-19 HISTORY — DX: Atherosclerotic heart disease of native coronary artery without angina pectoris: I25.10

## 2022-03-19 MED ORDER — ROSUVASTATIN CALCIUM 10 MG PO TABS: 10.0000 mg | ORAL_TABLET | Freq: Every day | ORAL | 3 refills | Status: DC

## 2022-03-19 MED ORDER — VALSARTAN 160 MG PO TABS: 160.0000 mg | ORAL_TABLET | Freq: Every day | ORAL | 3 refills | Status: DC

## 2022-03-19 NOTE — Patient Instructions (Addendum)
Medication Instructions:  ?START VALSARTAN 160 MG DAILY  ? ?START ROSUVASTATIN 10 MG DAILY  ? ?*If you need a refill on your cardiac medications before your next appointment, please call your pharmacy* ? ?Lab Work: ?BMET IN 1 WEEK  ? ?FASTING LP/CMET IN 2-3 MONTHS  ? ?If you have labs (blood work) drawn today and your tests are completely normal, you will receive your results only by: ?MyChart Message (if you have MyChart) OR ?A paper copy in the mail ?If you have any lab test that is abnormal or we need to change your treatment, we will call you to review the results. ? ?Testing/Procedures: ?NONE ? ?Follow-Up: ?At [facility], you and your health needs are our priority.  As part of our continuing mission to provide you with exceptional heart care, we have created designated Provider Care Teams.  These Care Teams include your primary Cardiologist (physician) and Advanced Practice Providers (APPs -  Physician Assistants and Nurse Practitioners) who all work together to provide you with the care you need, when you need it. ? ?We recommend signing up for the patient portal called "MyChart".  Sign up information is provided on this After Visit Summary.  MyChart is used to connect with patients for Virtual Visits (Telemedicine).  Patients are able to view lab/test results, encounter notes, upcoming appointments, etc.  Non-urgent messages can be sent to your provider as well.   ?To learn more about what you can do with MyChart, go to [email or web address].   ? ?Your next appointment:   ?6 month(s) ? ?The format for your next appointment:   ?In Person ? ?Provider:   ?Skeet Latch, MD  ? ?[place] W NP  ? ? ? ?

## 2022-03-19 NOTE — Assessment & Plan Note (Signed)
Noted on CT scan.  LDL goal is less than 70.  She will start rosuvastatin 10 mg.  Repeat lipids and a CMP in 2 to 3 months. ?

## 2022-03-19 NOTE — Assessment & Plan Note (Signed)
She smokes about 1 pack of cigarettes every 3 to 4 days.  Continue to encourage cessation. ?

## 2022-03-19 NOTE — Assessment & Plan Note (Signed)
Blood pressure not well-controlled.  It was elevated both initially and on repeat. Continue amlodipine.  She does not want to start a diuretic due to incontinence.  Add valsartan 160 mg daily.  Check a BMP in a week. ?

## 2022-03-19 NOTE — Assessment & Plan Note (Signed)
Sclerosis without stenosis.  Gradients were normal on her echo 07/2021.  She does have a slight systolic murmur on exam. ?

## 2022-03-19 NOTE — Assessment & Plan Note (Signed)
1+ pedal pulses.  She does not have any claudication symptoms.  Adding a statin. ?

## 2022-03-19 NOTE — Assessment & Plan Note (Signed)
Noted on CT scans.  She has predominantly LAD calcification.  She does not have any ischemic symptoms.  Adding rosuvastatin as above.  LDL goal is less than 70. ?

## 2022-03-29 LAB — BASIC METABOLIC PANEL
BUN/Creatinine Ratio: 15 (ref 12–28)
BUN: 17 mg/dL (ref 8–27)
CO2: 23 mmol/L (ref 20–29)
Calcium: 10.5 mg/dL — ABNORMAL HIGH (ref 8.7–10.3)
Chloride: 106 mmol/L (ref 96–106)
Creatinine, Ser: 1.11 mg/dL — ABNORMAL HIGH (ref 0.57–1.00)
Glucose: 104 mg/dL — ABNORMAL HIGH (ref 70–99)
Potassium: 4.6 mmol/L (ref 3.5–5.2)
Sodium: 142 mmol/L (ref 134–144)
eGFR: 50 mL/min/{1.73_m2} — ABNORMAL LOW (ref >59)

## 2022-04-16 ENCOUNTER — Telehealth: Payer: Self-pay | Admitting: Cardiovascular Disease

## 2022-04-16 NOTE — Telephone Encounter (Signed)
Hey this patient returned your call

## 2022-04-16 NOTE — Telephone Encounter (Signed)
° °  Pt is returning call to get lab result °

## 2022-04-16 NOTE — Telephone Encounter (Signed)
Left detailed message and will recheck 5/30

## 2022-04-16 NOTE — Telephone Encounter (Signed)
-----   Message from Skeet Latch, MD sent at 04/15/2022  8:26 PM EDT ----- Calcium levels are elevated and kidney function is a little worse.  It is very mildly abnormal but higher than her baseline.  Make sure to stay hydrated and we will re-check at follow up.

## 2022-04-16 NOTE — Telephone Encounter (Signed)
  Per pt, she said she has doctor's appt at 5 pm. If she unable to answer phone to leave her a detailed message

## 2022-04-17 NOTE — Telephone Encounter (Signed)
Noted. Will collect BMP at upcoming visit.   Loel Dubonnet, NP

## 2022-04-23 NOTE — Progress Notes (Unsigned)
Office Visit    Patient Name: Christina Branch Date of Encounter: 04/24/2022  PCP:  Vincente Liberty, [place]  Cardiologist:  Skeet Latch, MD  Advanced Practice Provider:  No care team member to display Electrophysiologist:  None    Chief Complaint    Christina Branch is a 81 y.o. female with a hx of hypertension, hyperlipidemia, emphysema, tobacco abuse, GERD, aortic valve calcification presents today for hypertension follow up.   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   Claudication ([place])    Coronary artery calcification 03/19/2022   DJD (degenerative joint disease) of knee    Essential hypertension 03/19/2022   GERD (gastroesophageal reflux disease)    H/O: varicose veins    Right Leg   Hyperlipidemia    Hypertension    Hypoxemia    Myalgia and myositis, unspecified    Pain in limb    Peripheral neuropathy 03/07/2020   Varicose veins    Past Surgical History:  Procedure Laterality Date   CHOLECYSTECTOMY     Open cholecystectomy   COLON SURGERY     partial resection due to large polyps (Benign)   FRACTURE SURGERY  1953   ORIF right hip   HEMORRHOID SURGERY     INGUINAL HERNIA REPAIR N/A 08/31/2021   Procedure: PRIMARY REPAIR INCISIONAL HERNIA; EXCISION OF ABDOMINAL SKIN TAG;  Surgeon: Erroll Luna, MD;  Location: [facility];  Service: General;  Laterality: N/A;   LAPAROTOMY N/A 08/31/2021   Procedure: EXPLORATORY LAPAROTOMY;  Surgeon: Erroll Luna, MD;  Location: [facility];  Service: General;  Laterality: N/A;    Allergies  Allergies  Allergen Reactions   Penicillins Hives   Shellfish Allergy Swelling   Sulfa Antibiotics Nausea And Vomiting   Geralyn Flash [Fish Allergy] Swelling    History of Present Illness    Christina Branch is a 81 y.o. female with a hx of  hypertension, hyperlipidemia, emphysema, tobacco abuse, GERD, aortic valve calcification last seen 03/19/2022 by  Dr. Oval Linsey.  She was seen in consult 03/19/2022 by Dr. Oval Linsey at the request of pulmonology.  Echo 07/2021 LVEF 12%, grade 1 diastolic dysfunction, normal pulmonary pressures.  Chest CT showed coronary and aortic atherosclerosis as well as adrenal adenoma.  She noted bilateral feet swelling with right greater than left which improved with elevation.  She was more short of breath with lying flat.  She was not exercising regularly due to arthritic pain in her knees and cervical spine.  Unable to take fluid pill due to incontinence.  Cooks predominantly at home.  Smokes 1 pack of cigarettes every 3 to 4 days.  Her BP was not well controlled and she was started on valsartan 160 mg daily.  Rosuvastatin 10 mg daily to achieve LDL goal less than 70.  She presents today for follow-up. Tells me since last seen she has felt "not good". She has been having difficulties with reflux which previously was resolved with a course of omeprazole a couple years ago and encouraged to repeat. She monitors her blood pressure at home with arm cuff intermittently with readings detailed below.  Of note often checking blood pressure before or at the same time that she takes her medications.  She is taking her Valsartan and amlodipine in the morning.  She does inquire whether valsartan causes cancer and reassurance provided that it does not cause cancer.  Tells me the first week  she took it she noted more swelling in her right leg but this has returned to her baseline.  She has worsening swelling at the end of the day that resolves by morning and does elevate her legs when sitting.  Has noticed some cramping in her legs.  Denies chest pain, dyspnea, palpitations  5/25 190/80, 155/67, 182/73, 144/80 5/27 179/88, 150/79, 160/76, 133/67, 143/69 5/29 170/73 138/70 5/30 181/81 and 171/75   EKGs/Labs/Other Studies Reviewed:   The following studies were reviewed today:  CT Abdomen 08/31/21 IMPRESSION: 1. Dilated small bowel within  lower abdominal ventral hernia with resultant small bowel obstruction. Findings are concerning for strangulated or incarcerated hernia. There is mesenteric edema present which can be seen with vascular compromise. No pneumatosis or free air. 2. Small amount of free fluid in the hernia and pelvis. 3. Cholecystectomy.  Stable biliary ductal dilatation. 4. Colonic diverticulosis. 5.  Aortic Atherosclerosis (ICD10-I70.0). 6. These results were called by telephone at the time of interpretation on 08/31/2021 at 5:07 pm to provider Fredia Sorrow , who verbally acknowledged these results.   Echo 08/21/21 1. Left ventricular ejection fraction, by estimation, is 70 to 75%. Left  ventricular ejection fraction by 3D volume is 72 %. The left ventricle has  hyperdynamic function. The left ventricle has no regional wall motion  abnormalities. Left ventricular  diastolic parameters are consistent with Grade I diastolic dysfunction  (impaired relaxation).   2. Right ventricular systolic function is normal. The right ventricular  size is normal. There is normal pulmonary artery systolic pressure. The  estimated right ventricular systolic pressure is 70.0 mmHg.   3. Left atrial size was mildly dilated.   4. The mitral valve is normal in structure. No evidence of mitral valve  regurgitation. No evidence of mitral stenosis.   5. The aortic valve is calcified. Aortic valve regurgitation is not  visualized. Mild to moderate aortic valve sclerosis/calcification is  present, without any evidence of aortic stenosis.   6. The inferior vena cava is normal in size with greater than 50%  respiratory variability, suggesting right atrial pressure of 3 mmHg.     EKG: No EKG today  Recent Labs: 09/01/2021: ALT 11 09/02/2021: Magnesium 2.2 09/03/2021: Hemoglobin 12.0; Platelets 303 03/28/2022: BUN 17; Creatinine, Ser 1.11; Potassium 4.6; Sodium 142  Recent Lipid Panel No results found for: CHOL, TRIG, HDL, CHOLHDL,  VLDL, LDLCALC, LDLDIRECT    Home Medications   Current Meds  Medication Sig   amLODipine (NORVASC) 10 MG tablet Take 10 mg by mouth daily.    diclofenac Sodium (VOLTAREN) 1 % GEL Apply 2 g topically daily as needed (pain).   HYDROcodone-ibuprofen (VICOPROFEN) 7.5-200 MG per tablet Take 1 tablet by mouth every 6 (six) hours as needed for moderate pain.    loratadine (CLARITIN) 10 MG tablet Take 10 mg by mouth daily as needed for allergies.   Multiple Vitamins-Minerals (CENTRUM SILVER PO) Take 1 tablet by mouth daily.    omeprazole (PRILOSEC) 20 MG capsule Take 1 capsule (20 mg total) by mouth daily.   rosuvastatin (CRESTOR) 10 MG tablet Take 1 tablet (10 mg total) by mouth daily.   valsartan (DIOVAN) 320 MG tablet Take 1 tablet (320 mg total) by mouth daily.   Vitamin D, Ergocalciferol, (DRISDOL) 50000 units CAPS capsule Take 50,000 Units by mouth every 7 (seven) days.   [DISCONTINUED] valsartan (DIOVAN) 160 MG tablet Take 1 tablet (160 mg total) by mouth daily.     Review of Systems  All other systems reviewed and are otherwise negative except as noted above.  Physical Exam    VS:  BP (!) 158/62 (BP Location: Right Arm, Patient Position: Sitting)   Pulse 62   Ht '5\' 11"'$  (1.803 m)   Wt 180 lb (81.6 kg)   BMI 25.10 kg/m  , BMI Body mass index is 25.1 kg/m.  Wt Readings from Last 3 Encounters:  04/24/22 180 lb (81.6 kg)  03/19/22 179 lb 9.6 oz (81.5 kg)  01/25/22 178 lb 6.4 oz (80.9 kg)    GEN: Well nourished, well developed, in no acute distress. HEENT: normal. Neck: Supple, no JVD, carotid bruits, or masses. Cardiac: RRR, no  rubs, or gallops. Gr 2/6 systolic murmur. No clubbing, cyanosis, edema.  Radials/PT 2+ and equal bilaterally.  Respiratory:  Respirations regular and unlabored, clear to auscultation bilaterally. GI: Soft, nontender, nondistended. MS: No deformity or atrophy. Skin: Warm and dry, no rash. Neuro:  Strength and sensation are intact. Psych: Normal  affect.  Assessment & Plan    CAD /aortic atherosclerosis-noted by CT scan.  Predominantly LAD calcification.  GDMT includes rosuvastatin 10 mg daily.   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  Gradients normal by echo 07/2021.  Slight murmur stable on exam   HTN -BP goal less than 130/80.  Since addition of valsartan blood pressure checked very intermittently at home prior to medications >130/80. Encouraged to check after medications. Continue Amlodipine '10mg'$  QD. Avoid diuretic due to incontinence. Increase Valsartan to '320mg'$  QD. Update BMP magnesium due to leg cramps.   GERD - Rx 2 week course of Omeprazole. If not resolved after 2 weeks, may complete 30 day course.  Tobacco abuse - Smoking cessation encouraged. Recommend utilization of [id number].   Disposition: Follow up in 2 month(s) with Skeet Latch, MD or APP.  Signed, Loel Dubonnet, NP 04/24/2022, 1:29 PM [facility]

## 2022-04-24 ENCOUNTER — Encounter (HOSPITAL_BASED_OUTPATIENT_CLINIC_OR_DEPARTMENT_OTHER): Payer: Self-pay | Admitting: Family

## 2022-04-24 ENCOUNTER — Ambulatory Visit (HOSPITAL_BASED_OUTPATIENT_CLINIC_OR_DEPARTMENT_OTHER): Payer: Medicare Other | Admitting: Family

## 2022-04-24 ENCOUNTER — Other Ambulatory Visit (HOSPITAL_BASED_OUTPATIENT_CLINIC_OR_DEPARTMENT_OTHER): Payer: Self-pay | Admitting: Family

## 2022-04-24 VITALS — BP 158/62 | HR 62 | Ht 71.0 in | Wt 180.0 lb

## 2022-04-24 DIAGNOSIS — I10 Essential (primary) hypertension: Secondary | ICD-10-CM | POA: Diagnosis not present

## 2022-04-24 DIAGNOSIS — K219 Gastro-esophageal reflux disease without esophagitis: Secondary | ICD-10-CM

## 2022-04-24 DIAGNOSIS — Z720 Tobacco use: Secondary | ICD-10-CM | POA: Diagnosis not present

## 2022-04-24 DIAGNOSIS — I700 Atherosclerosis of aorta: Secondary | ICD-10-CM

## 2022-04-24 DIAGNOSIS — I358 Other nonrheumatic aortic valve disorders: Secondary | ICD-10-CM

## 2022-04-24 MED ORDER — OMEPRAZOLE 20 MG PO CPDR: 20.0000 mg | DELAYED_RELEASE_CAPSULE | Freq: Every day | ORAL | 0 refills | Status: DC

## 2022-04-24 MED ORDER — VALSARTAN 320 MG PO TABS: 320.0000 mg | ORAL_TABLET | Freq: Every day | ORAL | 2 refills | Status: DC

## 2022-04-24 NOTE — Patient Instructions (Addendum)
Medication Instructions:  Your physician has recommended you make the following change in your medication:   INCREASE Valsartan to '320mg'$  daily You may use up your '160mg'$  tablets by taking two tablets together.   *If you need a refill on your cardiac medications before your next appointment, please call your pharmacy*   Lab Work: Your physician recommends that you return for lab work this week for BMP, magnesium  If you have labs (blood work) drawn today and your tests are completely normal, you will receive your results only by: [place] (if you have MyChart) OR A paper copy in the mail If you have any lab test that is abnormal or we need to change your treatment, we will call you to review the results.   Testing/Procedures: None ordered today.   Follow-Up: At [facility], you and your health needs are our priority.  As part of our continuing mission to provide you with exceptional heart care, we have created designated Provider Care Teams.  These Care Teams include your primary Cardiologist (physician) and Advanced Practice Providers (APPs -  Physician Assistants and Nurse Practitioners) who all work together to provide you with the care you need, when you need it.  We recommend signing up for the patient portal called "MyChart".  Sign up information is provided on this After Visit Summary.  MyChart is used to connect with patients for Virtual Visits (Telemedicine).  Patients are able to view lab/test results, encounter notes, upcoming appointments, etc.  Non-urgent messages can be sent to your provider as well.   To learn more about what you can do with MyChart, go to [email or web address].    Your next appointment:   2 month(s)  The format for your next appointment:   In Person  Provider:   Dr. Oval Linsey or Loel Dubonnet, NP   Other Instructions  Tips to Measure your Blood Pressure Correctly  To determine whether you have hypertension, a medical professional  will take a blood pressure reading. How you prepare for the test, the position of your arm, and other factors can change a blood pressure reading by 10% or more. That could be enough to hide high blood pressure, start you on a drug you don't really need, or lead your doctor to incorrectly adjust your medications.  National and international guidelines offer specific instructions for measuring blood pressure. If a doctor, nurse, or medical assistant isn't doing it right, don't hesitate to ask him or her to get with the guidelines.  Here's what you can do to ensure a correct reading:  Don't drink a caffeinated beverage or smoke during the 30 minutes before the test.  Sit quietly for five minutes before the test begins.  During the measurement, sit in a chair with your feet on the floor and your arm supported so your elbow is at about heart level.  The inflatable part of the cuff should completely cover at least 80% of your upper arm, and the cuff should be placed on bare skin, not over a shirt.  Don't talk during the measurement.  Blood pressure categories  Blood pressure category SYSTOLIC (upper number)  DIASTOLIC (lower number)  Normal Less than 120 mm Hg and Less than 80 mm Hg  Elevated 120-129 mm Hg and Less than 80 mm Hg  High blood pressure: Stage 1 hypertension 130-139 mm Hg or 80-89 mm Hg  High blood pressure: Stage 2 hypertension 140 mm Hg or higher or 90 mm Hg or higher  Hypertensive crisis (  consult your doctor immediately) Higher than 180 mm Hg and/or Higher than 120 mm Hg  Source: American Heart Association and American Stroke Association. For more on getting your blood pressure under control, buy Controlling Your Blood Pressure, a Special Health Report from [facility].   Blood Pressure Log   Date   Time  Blood Pressure  Example: Nov 1 9 AM 124/78                                               Important Information About Sugar

## 2022-04-28 LAB — BASIC METABOLIC PANEL WITH GFR
BUN/Creatinine Ratio: 13 (ref 12–28)
BUN: 15 mg/dL (ref 8–27)
CO2: 22 mmol/L (ref 20–29)
Calcium: 10.1 mg/dL (ref 8.7–10.3)
Chloride: 107 mmol/L — ABNORMAL HIGH (ref 96–106)
Creatinine, Ser: 1.13 mg/dL — ABNORMAL HIGH (ref 0.57–1.00)
Glucose: 70 mg/dL (ref 70–99)
Potassium: 4.8 mmol/L (ref 3.5–5.2)
Sodium: 141 mmol/L (ref 134–144)
eGFR: 49 mL/min/1.73 — ABNORMAL LOW

## 2022-04-28 LAB — MAGNESIUM: Magnesium: 2.2 mg/dL (ref 1.6–2.3)

## 2022-04-30 ENCOUNTER — Telehealth (HOSPITAL_BASED_OUTPATIENT_CLINIC_OR_DEPARTMENT_OTHER): Payer: Self-pay

## 2022-04-30 DIAGNOSIS — I10 Essential (primary) hypertension: Secondary | ICD-10-CM

## 2022-04-30 MED ORDER — VALSARTAN 160 MG PO TABS: 160.0000 mg | ORAL_TABLET | Freq: Every day | ORAL | 3 refills | Status: DC

## 2022-04-30 NOTE — Telephone Encounter (Addendum)
Results called to patient who verbalizes understanding! Labs ordered and mailed, prescriptions updated and sent to pharmacy on file       ----- Message from Loel Dubonnet, NP sent at 04/30/2022  8:06 AM EDT ----- Normal electrolytes. Kidney function slightly decreased. Ensure staying well hydrated.   Please ensure she is staying well hydrated. Reduce Valsartan to '160mg'$  daily. Repeat BMP in 1 week. Please ask her to check BP daily two hours after medications and keep a log. We will consider additional medication changes pending updated labs in 1 week.

## 2022-05-10 ENCOUNTER — Telehealth (HOSPITAL_BASED_OUTPATIENT_CLINIC_OR_DEPARTMENT_OTHER): Payer: Self-pay

## 2022-05-10 LAB — BASIC METABOLIC PANEL
BUN/Creatinine Ratio: 14 (ref 12–28)
BUN: 16 mg/dL (ref 8–27)
CO2: 20 mmol/L (ref 20–29)
Calcium: 10.1 mg/dL (ref 8.7–10.3)
Chloride: 108 mmol/L — ABNORMAL HIGH (ref 96–106)
Creatinine, Ser: 1.13 mg/dL — ABNORMAL HIGH (ref 0.57–1.00)
Glucose: 100 mg/dL — ABNORMAL HIGH (ref 70–99)
Potassium: 4.3 mmol/L (ref 3.5–5.2)
Sodium: 142 mmol/L (ref 134–144)
eGFR: 49 mL/min/{1.73_m2} — ABNORMAL LOW (ref >59)

## 2022-05-10 NOTE — Telephone Encounter (Addendum)
Called results to patient and left results on VM (ok per DPR), instructions left to call office back with her most recent blood pressures.     ----- Message from Loel Dubonnet, NP sent at 05/10/2022  1:15 PM EDT ----- Kidney function stable compared to last labs.  Continue to stay well-hydrated.  Please ensure she is taking the reduced dose of valsartan 160 mg daily.  Please request most recent blood pressures from patient.

## 2022-05-11 MED ORDER — ISOSORBIDE MONONITRATE ER 30 MG PO TB24: 30.0000 mg | ORAL_TABLET | Freq: Every day | ORAL | 3 refills | Status: DC

## 2022-05-11 NOTE — Telephone Encounter (Signed)
RN called patient with recommendation for hydralazine. Patient reported that she took in hospital and had swelling.   Will instead Rx Imdur '30mg'$  QHS. Educated to report headache and may use PRN Tylenol. She will monitor BP at home 2hours after medicaitons and call in 1 week with readings.   Loel Dubonnet, NP

## 2022-05-11 NOTE — Telephone Encounter (Signed)
RN returned call to patient. Patient verbalizes understanding and provided the following blood pressures   160/81 155/74 158/74 160/63 150/75 168/74 176/76   Routed to C. Gilford Rile, NP as requested    ----- Message from Loel Dubonnet, NP sent at 05/10/2022  1:15 PM EDT ----- Kidney function stable compared to last labs.  Continue to stay well-hydrated.  Please ensure she is taking the reduced dose of valsartan 160 mg daily.   Please request most recent blood pressures from patient.

## 2022-05-11 NOTE — Telephone Encounter (Signed)
Left message for patient to call back     ----- Message from Loel Dubonnet, NP sent at 05/10/2022  1:15 PM EDT ----- Kidney function stable compared to last labs.  Continue to stay well-hydrated.  Please ensure she is taking the reduced dose of valsartan 160 mg daily.   Please request most recent blood pressures from patient.

## 2022-05-11 NOTE — Telephone Encounter (Signed)
RN returned call to patient and provided the following recommendations. Patient states that she took hydralazine in the hospital and it made her swell and gave her an increased urine output.    Per Laurann Montana have patient take Imdur '30mg'$  daily. may cause headache but often improves after a couple days. if she notes headache okay to take tylenol. if headache does not improve after 2-3 days, then discontinue and call to let us know     "BP above goal of 130/80. Hesitant to increase Valsartan due to renal function. Start Hydralazine '25mg'$  BID. Call with report of blood pressures in 1 week.    Loel Dubonnet  "

## 2022-05-11 NOTE — Telephone Encounter (Signed)
BP above goal of 130/80. Hesitant to increase Valsartan due to renal function. Start Hydralazine '25mg'$  BID. Call with report of blood pressures in 1 week.   Loel Dubonnet

## 2022-05-11 NOTE — Telephone Encounter (Signed)
Pt returning call and req call back.

## 2022-05-11 NOTE — Addendum Note (Signed)
Addended by: Gerald Stabs on: 05/11/2022 05:15 PM   Modules accepted: Orders

## 2022-06-20 ENCOUNTER — Telehealth: Payer: Self-pay | Admitting: Cardiovascular Disease

## 2022-06-20 NOTE — Telephone Encounter (Signed)
RN returned call to patient to discuss tachycardia,   Patient states at 12:30 she felt her heart racing and pounding it was 143, at 1pm heart rate was 120, has not checked since then, patient going to check while on the phone    Patient states she is also still having swelling in her legs and ankles and blood pressures are still elevated at 150-160/70-85 range    Vitals on phone at 1504  BP: 143/88 P: 61  Patient states she was recently started on Imdur '30mg'$  QD,   Taking almo 10 QD, imdur 30 QD  Not taking Valsartan '160mg'$ - patient states this was stopped.

## 2022-06-20 NOTE — Telephone Encounter (Signed)
RN returned call to patient and provided the following recommendations, patient states she was not having these symptoms before starting Imdur and she will not increase her dose. Patient will speak with Dr. Oval Linsey at her upcoming appointment.      "Valsartan was stopped based on blood work.  Okay to remove from med list.   Swelling more likely to be related to her amlodipine rather than due to Imdur.  Reduce amlodipine to 5 mg daily.  Increase Imdur to 60 mg daily.  Follow-up as scheduled with Dr. Oval Linsey in a couple weeks.   Ensure elevating legs when sitting, following a low-sodium diet, wearing compression stockings to prevent lower extremity edema.   Palpitations are not a common side effect of either of her medications.  Can prevent palpitations or tachycardia by staying well-hydrated, avoiding caffeine, managing stress well.  Her heart rate is often in the office in the 60s - hesitant to add medication to slow her heart rate. We can consider a heart monitor at her upcoming clinic visit if she is still having episodes of heart racing.    Loel Dubonnet, NP "

## 2022-06-20 NOTE — Telephone Encounter (Signed)
STAT if HR is under 50 or over 120 (normal HR is 60-100 beats per minute)  What is your heart rate? 143 at 12:30 pm And 120 30 mins later, and has not checked since  Do you have a log of your heart rate readings (document readings)?   7/25 - 167/77 HR 51 160/75 HR 60  Do you have any other symptoms? BP 157/79 and she says the swelling in her ankles is still an issue. She said this may be due to a medication that was added last time she was at the office: isosorbide mononitrate (IMDUR) 30 MG 24 hr tablet Pt is requesting a call back to discuss this.

## 2022-06-20 NOTE — Telephone Encounter (Signed)
Valsartan was stopped based on blood work.  Okay to remove from med list.  Swelling more likely to be related to her amlodipine rather than due to Imdur.  Reduce amlodipine to 5 mg daily.  Increase Imdur to 60 mg daily.  Follow-up as scheduled with Dr. Oval Linsey in a couple weeks.  Ensure elevating legs when sitting, following a low-sodium diet, wearing compression stockings to prevent lower extremity edema.  Palpitations are not a common side effect of either of her medications.  Can prevent palpitations or tachycardia by staying well-hydrated, avoiding caffeine, managing stress well.  Her heart rate is often in the office in the 60s - hesitant to add medication to slow her heart rate. We can consider a heart monitor at her upcoming clinic visit if she is still having episodes of heart racing.   Loel Dubonnet, NP

## 2022-06-22 LAB — COMPREHENSIVE METABOLIC PANEL
ALT: 9 IU/L (ref 0–32)
AST: 14 IU/L (ref 0–40)
Albumin/Globulin Ratio: 2.1 (ref 1.2–2.2)
Albumin: 4.1 g/dL (ref 3.7–4.7)
Alkaline Phosphatase: 82 IU/L (ref 44–121)
BUN/Creatinine Ratio: 12 (ref 12–28)
BUN: 14 mg/dL (ref 8–27)
Bilirubin Total: 0.3 mg/dL (ref 0.0–1.2)
CO2: 21 mmol/L (ref 20–29)
Calcium: 10.3 mg/dL (ref 8.7–10.3)
Chloride: 104 mmol/L (ref 96–106)
Creatinine, Ser: 1.13 mg/dL — ABNORMAL HIGH (ref 0.57–1.00)
Globulin, Total: 2 g/dL (ref 1.5–4.5)
Glucose: 91 mg/dL (ref 70–99)
Potassium: 4.2 mmol/L (ref 3.5–5.2)
Sodium: 141 mmol/L (ref 134–144)
Total Protein: 6.1 g/dL (ref 6.0–8.5)
eGFR: 49 mL/min/{1.73_m2} — ABNORMAL LOW (ref >59)

## 2022-06-22 LAB — LIPID PANEL
Chol/HDL Ratio: 3 ratio (ref 0.0–4.4)
Cholesterol, Total: 191 mg/dL (ref 100–199)
HDL: 63 mg/dL (ref >39)
LDL Chol Calc (NIH): 108 mg/dL — ABNORMAL HIGH (ref 0–99)
Triglycerides: 116 mg/dL (ref 0–149)
VLDL Cholesterol Cal: 20 mg/dL (ref 5–40)

## 2022-07-04 ENCOUNTER — Encounter (HOSPITAL_BASED_OUTPATIENT_CLINIC_OR_DEPARTMENT_OTHER): Payer: Self-pay | Admitting: Cardiovascular Disease

## 2022-07-04 ENCOUNTER — Ambulatory Visit (HOSPITAL_BASED_OUTPATIENT_CLINIC_OR_DEPARTMENT_OTHER): Payer: Medicare Other | Admitting: Cardiovascular Disease

## 2022-07-04 ENCOUNTER — Ambulatory Visit (INDEPENDENT_AMBULATORY_CARE_PROVIDER_SITE_OTHER): Payer: Medicare Other

## 2022-07-04 DIAGNOSIS — Z720 Tobacco use: Secondary | ICD-10-CM | POA: Diagnosis not present

## 2022-07-04 DIAGNOSIS — I2510 Atherosclerotic heart disease of native coronary artery without angina pectoris: Secondary | ICD-10-CM | POA: Diagnosis not present

## 2022-07-04 DIAGNOSIS — I10 Essential (primary) hypertension: Secondary | ICD-10-CM | POA: Diagnosis not present

## 2022-07-04 DIAGNOSIS — R002 Palpitations: Secondary | ICD-10-CM

## 2022-07-04 DIAGNOSIS — I2584 Coronary atherosclerosis due to calcified coronary lesion: Secondary | ICD-10-CM

## 2022-07-04 HISTORY — DX: Palpitations: R00.2

## 2022-07-04 MED ORDER — HYDROCHLOROTHIAZIDE 25 MG PO TABS: 25.0000 mg | ORAL_TABLET | Freq: Every day | ORAL | 3 refills | Status: DC

## 2022-07-04 NOTE — Assessment & Plan Note (Signed)
Ms. Nessel had a couple episodes of palpitations.  Her heart rates were in the 130s at the time.  I am concerned she may have been having atrial fibrillation.  We will have her wear a 14-day monitor.  We will also check a TSH, magnesium, BMP, and a CBC.

## 2022-07-04 NOTE — Assessment & Plan Note (Signed)
Asymptomatic coronary calcification.  LDL goal is less than 70.  She has had some leg pain since starting rosuvastatin.  We will have her stop it for the next month and reassess whether this helps her symptoms.  If it does, we will need to try an alternative statin at follow-up.

## 2022-07-04 NOTE — Patient Instructions (Addendum)
Medication Instructions:  STOP ROSUVASTATIN   STOP ISOSORBIDE   START HYDROCHLOROTHIAZIDE 25 MG DAILY   *If you need a refill on your cardiac medications before your next appointment, please call your pharmacy*  Lab Work: LABS IN 1 WEEK, NEED TO [place]   If you have labs (blood work) drawn today and your tests are completely normal, you will receive your results only by: MyChart Message (if you have MyChart) OR A paper copy in the mail If you have any lab test that is abnormal or we need to change your treatment, we will call you to review the results.  Testing/Procedures: 14 DAY ZIO MONITOR   Follow-Up: At [facility], you and your health needs are our priority.  As part of our continuing mission to provide you with exceptional heart care, we have created designated Provider Care Teams.  These Care Teams include your primary Cardiologist (physician) and Advanced Practice Providers (APPs -  Physician Assistants and Nurse Practitioners) who all work together to provide you with the care you need, when you need it.  We recommend signing up for the patient portal called "MyChart".  Sign up information is provided on this After Visit Summary.  MyChart is used to connect with patients for Virtual Visits (Telemedicine).  Patients are able to view lab/test results, encounter notes, upcoming appointments, etc.  Non-urgent messages can be sent to your provider as well.   To learn more about what you can do with MyChart, go to [email or web address].    Your next appointment:   1 month(s)  The format for your next appointment:   In Person  Provider:   Skeet Latch, MD, [place] Mam NP, [place] [facility]

## 2022-07-04 NOTE — Assessment & Plan Note (Addendum)
Blood pressure remains uncontrolled.  She did not have any improvement with valsartan or Imdur.  She is willing to try hydrochlorothiazide.  Will start 25 mg daily.  Continue amlodipine.  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  Recommend that when she comes back in a week for a basic metabolic panel that we also check renal and, aldosterone, serum catecholamines and metanephrines.  Her blood pressures continue to be very labile.  Pending these labs would consider trying spironolactone.

## 2022-07-04 NOTE — Progress Notes (Signed)
Cardiology Office Note:    Date:  07/04/2022   ID:  Christina Branch, DOB 10-11-1941, MRN [id number]  PCP:  Christina Liberty, MD  Cardiologist:  None    Referring MD: Christina Liberty, MD   No chief complaint on file.   History of Present Illness:    Christina Branch is a 81 y.o. female with a hx of hypertension, hyperlipidemia, emphysema, tobacco abuse, and GERD here today for follow-up.  She was initially seen 02/2022 for the evaluation and management of aortic valve calcification and chest pain. She saw Dr. Chase Branch 01/2021 and reported worsening shortness of breath. The SOB was worse with exertion and she denied chest pain. She declined an inhaler. Echo 07/2021 showed LVEF 72% with grade 1 diastolic dysfunction and normal pulmonary pressures. Chest CT showed coronary and aortic atherosclerosis as well as an adrenal adenoma.   At her first visit her BP was uncontrolled, so valsartan was added. She was also started on rosuvastatin. She followed up with Laurann Montana, NP 03/2022 and complained of GERD that improved with omeprazole. Her blood pressure was uncontrolled and she had leg cramping. Valsartan was increased.  Today, she is not doing well. On 06/20/22, she had an episode of her heart racing that lasted intermittently between 12 noon and 3 PM. She denies having any chest pain/pressure or dizziness while this was happening. She has had one other similar episode of palpitations since then. Her heart rate will be as high as the 140's at home even with minimal activity. Her BP log at home shows high blood pressure readings and high heart rates. She also has had muscle aches, cramps, and edema, especially in her right leg but lately has become an issue in her bilateral legs. She denies any chest pain or shortness of breath. No lightheadedness, headaches, syncope, orthopnea, or PND.   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   Claudication [facility])    Coronary  artery calcification 03/19/2022   DJD (degenerative joint disease) of knee    Essential hypertension 03/19/2022   GERD (gastroesophageal reflux disease)    H/O: varicose veins    Right Leg   Hyperlipidemia    Hypertension    Hypoxemia    Myalgia and myositis, unspecified    Pain in limb    Palpitations 07/04/2022   Peripheral neuropathy 03/07/2020   Varicose veins     Past Surgical History:  Procedure Laterality Date   CHOLECYSTECTOMY     Open cholecystectomy   COLON SURGERY     partial resection due to large polyps (Benign)   FRACTURE SURGERY  1953   ORIF right hip   HEMORRHOID SURGERY     INGUINAL HERNIA REPAIR N/A 08/31/2021   Procedure: PRIMARY REPAIR INCISIONAL HERNIA; EXCISION OF ABDOMINAL SKIN TAG;  Surgeon: Christina Luna, MD;  Location: [facility];  Service: General;  Laterality: N/A;   LAPAROTOMY N/A 08/31/2021   Procedure: EXPLORATORY LAPAROTOMY;  Surgeon: Christina Luna, MD;  Location: [facility];  Service: General;  Laterality: N/A;    Current Medications: Current Meds  Medication Sig   amLODipine (NORVASC) 10 MG tablet Take 10 mg by mouth daily.    diclofenac Sodium (VOLTAREN) 1 % GEL Apply 2 g topically daily as needed (pain).   hydrochlorothiazide (HYDRODIURIL) 25 MG tablet Take 1 tablet (25 mg total) by mouth daily.   HYDROcodone-ibuprofen (VICOPROFEN) 7.5-200 MG per tablet Take 1 tablet by mouth every 6 (six) hours as needed for moderate pain.  loratadine (CLARITIN) 10 MG tablet Take 10 mg by mouth daily as needed for allergies.   Multiple Vitamins-Minerals (CENTRUM SILVER PO) Take 1 tablet by mouth daily.    omeprazole (PRILOSEC) 20 MG capsule Take 1 capsule (20 mg total) by mouth daily.   Vitamin D, Ergocalciferol, (DRISDOL) 50000 units CAPS capsule Take 50,000 Units by mouth every 7 (seven) days.   [DISCONTINUED] isosorbide mononitrate (IMDUR) 30 MG 24 hr tablet Take 1 tablet (30 mg total) by mouth daily.   [DISCONTINUED] rosuvastatin (CRESTOR) 10 MG tablet Take 1  tablet (10 mg total) by mouth daily.     Allergies:   Penicillins, Shellfish allergy, Sulfa antibiotics, and Blain Pais allergy]   Social History   Socioeconomic History   Marital status: Divorced    Spouse name: Not on file   Number of children: Not on file   Years of education: Not on file   Highest education level: Not on file  Occupational History   Not on file  Tobacco Use   Smoking status: Every Day    Packs/day: 0.50    Years: 40.00    Total pack years: 20.00    Types: Cigarettes    Start date: 11/26/1962   Smokeless tobacco: Never   Tobacco comments:    Currently smoking 2-3 cigs occasionally as of 01/25/22  ep  Vaping Use   Vaping Use: Never used  Substance and Sexual Activity   Alcohol use: No   Drug use: No   Sexual activity: Not on file  Other Topics Concern   Not on file  Social History Narrative   Not on file   Social Determinants of Health   Financial Resource Strain: Low Risk  (03/19/2022)   Overall Financial Resource Strain (CARDIA)    Difficulty of Paying Living Expenses: Not hard at all  Food Insecurity: No Food Insecurity (03/19/2022)   Hunger Vital Sign    Worried About Running Out of Food in the Last Year: Never true    [place] in the Last Year: Never true  Transportation Needs: No Transportation Needs (03/19/2022)   PRAPARE - Hydrologist (Medical): No    Lack of Transportation (Non-Medical): No  Physical Activity: Inactive (03/19/2022)   Exercise Vital Sign    Days of Exercise per Week: 0 days    Minutes of Exercise per Session: 0 min  Stress: Not on file  Social Connections: Not on file     Family History: The patient's family history includes Breast cancer in her sister; Cancer in her mother; Hypertension in her son.  ROS:   Please see the history of present illness.    (+) Palpitations (+) Bilateral LE edema R>L (+) Myalgias All other systems reviewed and negative.   EKGs/Labs/Other Studies  Reviewed:    The following studies were reviewed today:  CT Abdomen 08/31/21 IMPRESSION: 1. Dilated small bowel within lower abdominal ventral hernia with resultant small bowel obstruction. Findings are concerning for strangulated or incarcerated hernia. There is mesenteric edema present which can be seen with vascular compromise. No pneumatosis or free air. 2. Small amount of free fluid in the hernia and pelvis. 3. Cholecystectomy.  Stable biliary ductal dilatation. 4. Colonic diverticulosis. 5.  Aortic Atherosclerosis (ICD10-I70.0). 6. These results were called by telephone at the time of interpretation on 08/31/2021 at 5:07 pm to provider Fredia Sorrow , who verbally acknowledged these results.  Echo 08/21/21 1. Left ventricular ejection fraction, by estimation, is 70 to  75%. Left  ventricular ejection fraction by 3D volume is 72 %. The left ventricle has  hyperdynamic function. The left ventricle has no regional wall motion  abnormalities. Left ventricular  diastolic parameters are consistent with Grade I diastolic dysfunction  (impaired relaxation).   2. Right ventricular systolic function is normal. The right ventricular  size is normal. There is normal pulmonary artery systolic pressure. The  estimated right ventricular systolic pressure is 17.5 mmHg.   3. Left atrial size was mildly dilated.   4. The mitral valve is normal in structure. No evidence of mitral valve  regurgitation. No evidence of mitral stenosis.   5. The aortic valve is calcified. Aortic valve regurgitation is not  visualized. Mild to moderate aortic valve sclerosis/calcification is  present, without any evidence of aortic stenosis.   6. The inferior vena cava is normal in size with greater than 50%  respiratory variability, suggesting right atrial pressure of 3 mmHg.   EKG:EKG is personally reviewed.  07/04/2022: EKG was not ordered.  03/19/22: Sinus rhythm rate 62  Recent Labs: 09/03/2021: Hemoglobin  12.0; Platelets 303 04/27/2022: Magnesium 2.2 06/21/2022: ALT 9; BUN 14; Creatinine, Ser 1.13; Potassium 4.2; Sodium 141   Recent Lipid Panel    Component Value Date/Time   CHOL 191 06/21/2022 1621   TRIG 116 06/21/2022 1621   HDL 63 06/21/2022 1621   CHOLHDL 3.0 06/21/2022 1621   LDLCALC 108 (H) 06/21/2022 1621    Physical Exam:    VS:  BP (!) 166/62 (BP Location: Right Arm, Patient Position: Sitting, Cuff Size: Normal)   Pulse 62   Ht '5\' 11"'$  (1.803 m)   Wt 176 lb 9.6 oz (80.1 kg)   SpO2 96%   BMI 24.63 kg/m  , BMI Body mass index is 24.63 kg/m. GENERAL:  Well appearing HEENT: Pupils equal round and reactive, fundi not visualized, oral mucosa unremarkable NECK:  No jugular venous distention, waveform within normal limits, carotid upstroke brisk and symmetric, no bruits, no thyromegaly LUNGS:  Clear to auscultation bilaterally HEART:  RRR.  PMI not displaced or sustained,S1 and S2 within normal limits, no S3, no S4, no clicks, no rubs, 2/6 systolic murmur at the LUSB ABD:  Flat, positive bowel sounds normal in frequency in pitch, no bruits, no rebound, no guarding, no midline pulsatile mass, no hepatomegaly, no splenomegaly EXT:  1+ DP/TP bilaterally, trace LE edema, no cyanosis no clubbing SKIN:  No rashes no nodules NEURO:  Cranial nerves II through XII grossly intact, motor grossly intact throughout PSYCH:  Cognitively intact, oriented to person place and time  ASSESSMENT:    1. Coronary artery calcification   2. Essential hypertension   3. Palpitations   4. Tobacco abuse     PLAN:    Coronary artery calcification Asymptomatic coronary calcification.  LDL goal is less than 70.  She has had some leg pain since starting rosuvastatin.  We will have her stop it for the next month and reassess whether this helps her symptoms.  If it does, we will need to try an alternative statin at follow-up.  Essential hypertension Blood pressure remains uncontrolled.  She did not have  any improvement with valsartan or Imdur.  She is willing to try hydrochlorothiazide.  Will start 25 mg daily.  Continue amlodipine.  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  Recommend that when she comes back in a week for a basic metabolic panel that we also check renal and, aldosterone, serum catecholamines  and metanephrines.  Her blood pressures continue to be very labile.  Pending these labs would consider trying spironolactone.  Palpitations Ms. Tvedt had a couple episodes of palpitations.  Her heart rates were in the 130s at the time.  I am concerned she may have been having atrial fibrillation.  We will have her wear a 14-day monitor.  We will also check a TSH, magnesium, BMP, and a CBC.  Tobacco abuse Continue to encourage smoking cessation.   Medication Adjustments/Labs and Tests Ordered: Current medicines are reviewed at length with the patient today.  Concerns regarding medicines are outlined above.  Orders Placed This Encounter  Procedures   CBC with Differential/Platelet   T4, free   TSH   Metanephrines, plasma   Catecholamines, fractionated, plasma   Aldosterone + renin activity w/ ratio   Magnesium   Comprehensive metabolic panel   LONG TERM MONITOR (3-14 DAYS)   Meds ordered this encounter  Medications   hydrochlorothiazide (HYDRODIURIL) 25 MG tablet    Sig: Take 1 tablet (25 mg total) by mouth daily.    Dispense:  90 tablet    Refill:  3    D/C ISOSORBIDE   Disposition:  FU with Tavish Gettis C. Oval Linsey, MD, [facility] in 1 month.   I,Jenifer Arts development officer as a Education administrator for [place], MD.,have documented all relevant documentation on the behalf of Skeet Latch, MD,as directed by  Skeet Latch, MD while in the presence of Skeet Latch, MD.   I, [place] Oval Linsey, MD have reviewed all documentation for this visit.  The documentation of the exam, diagnosis, procedures, and orders on 07/04/2022 are all accurate and  complete.   Signed, Skeet Latch, MD  07/04/2022 5:11 PM    [facility]

## 2022-07-04 NOTE — Assessment & Plan Note (Signed)
Continue to encourage smoking cessation. 

## 2022-07-18 ENCOUNTER — Telehealth (HOSPITAL_BASED_OUTPATIENT_CLINIC_OR_DEPARTMENT_OTHER): Payer: Self-pay | Admitting: Cardiovascular Disease

## 2022-07-18 NOTE — Telephone Encounter (Signed)
-----   Message from Loel Dubonnet, NP sent at 07/17/2022 12:35 PM EDT ----- Stable kidney function.  Normal electrolytes, liver function.  LDL of 108 which is above goal of less than 70.  Please inquire if her leg pain has improved since rosuvastatin was held.  If not improved, low suspicion due to statin and recommend follow up with primary care provider and resume Crestor '10mg'$  daily.  If has improved, recommend trial of Pravastatin '20mg'$  daily instead.  If she wishes to wait until upcoming visit to make medication changes that is reasonable.

## 2022-07-18 NOTE — Telephone Encounter (Signed)
Left message to call back  

## 2022-07-18 NOTE — Telephone Encounter (Signed)
Patient called stating she was returning RN Melinda's call.

## 2022-07-18 NOTE — Telephone Encounter (Signed)
Pt. Returning your call.

## 2022-07-19 NOTE — Telephone Encounter (Signed)
Left message to call back  

## 2022-07-19 NOTE — Telephone Encounter (Signed)
Pt returning a call.

## 2022-07-24 LAB — CBC WITH DIFFERENTIAL/PLATELET
Basophils Absolute: 0.1 10*3/uL (ref 0.0–0.2)
Basos: 1 %
EOS (ABSOLUTE): 0.4 10*3/uL (ref 0.0–0.4)
Eos: 5 %
Hematocrit: 42 % (ref 34.0–46.6)
Hemoglobin: 13.6 g/dL (ref 11.1–15.9)
Immature Grans (Abs): 0 10*3/uL (ref 0.0–0.1)
Immature Granulocytes: 0 %
Lymphocytes Absolute: 2.5 10*3/uL (ref 0.7–3.1)
Lymphs: 33 %
MCH: 29.1 pg (ref 26.6–33.0)
MCHC: 32.4 g/dL (ref 31.5–35.7)
MCV: 90 fL (ref 79–97)
Monocytes Absolute: 0.6 10*3/uL (ref 0.1–0.9)
Monocytes: 8 %
Neutrophils Absolute: 3.9 10*3/uL (ref 1.4–7.0)
Neutrophils: 53 %
Platelets: 340 10*3/uL (ref 150–450)
RBC: 4.68 x10E6/uL (ref 3.77–5.28)
RDW: 13 % (ref 11.7–15.4)
WBC: 7.4 10*3/uL (ref 3.4–10.8)

## 2022-07-24 LAB — T4, FREE: Free T4: 1.28 ng/dL (ref 0.82–1.77)

## 2022-07-24 LAB — COMPREHENSIVE METABOLIC PANEL
ALT: 12 IU/L (ref 0–32)
AST: 15 IU/L (ref 0–40)
Albumin/Globulin Ratio: 1.9 (ref 1.2–2.2)
Albumin: 4.2 g/dL (ref 3.7–4.7)
Alkaline Phosphatase: 80 IU/L (ref 44–121)
BUN/Creatinine Ratio: 18 (ref 12–28)
BUN: 19 mg/dL (ref 8–27)
Bilirubin Total: 0.8 mg/dL (ref 0.0–1.2)
CO2: 25 mmol/L (ref 20–29)
Calcium: 10.8 mg/dL — ABNORMAL HIGH (ref 8.7–10.3)
Chloride: 103 mmol/L (ref 96–106)
Creatinine, Ser: 1.08 mg/dL — ABNORMAL HIGH (ref 0.57–1.00)
Globulin, Total: 2.2 g/dL (ref 1.5–4.5)
Glucose: 110 mg/dL — ABNORMAL HIGH (ref 70–99)
Potassium: 3.9 mmol/L (ref 3.5–5.2)
Sodium: 139 mmol/L (ref 134–144)
Total Protein: 6.4 g/dL (ref 6.0–8.5)
eGFR: 51 mL/min/{1.73_m2} — ABNORMAL LOW (ref >59)

## 2022-07-24 LAB — METANEPHRINES, PLASMA
Metanephrine, Free: 16.1 pg/mL (ref 0.0–88.0)
Normetanephrine, Free: 58.7 pg/mL (ref 0.0–297.2)

## 2022-07-24 LAB — CATECHOLAMINES, FRACTIONATED, PLASMA
Dopamine: <30 pg/mL (ref 0–48)
Epinephrine: <15 pg/mL (ref 0–62)
Norepinephrine: 572 pg/mL (ref 0–874)

## 2022-07-24 LAB — TSH: TSH: 0.872 u[IU]/mL (ref 0.450–4.500)

## 2022-07-24 LAB — MAGNESIUM: Magnesium: 2 mg/dL (ref 1.6–2.3)

## 2022-07-24 LAB — ALDOSTERONE + RENIN ACTIVITY W/ RATIO
ALDOS/RENIN RATIO: 6.5 (ref 0.0–30.0)
ALDOSTERONE: 12.9 ng/dL (ref 0.0–30.0)
Renin: 1.971 ng/mL/hr (ref 0.167–5.380)

## 2022-07-26 NOTE — Telephone Encounter (Signed)
Left message to call back   Have these labs and more recent labs to report

## 2022-07-27 ENCOUNTER — Telehealth (HOSPITAL_BASED_OUTPATIENT_CLINIC_OR_DEPARTMENT_OTHER): Payer: Self-pay | Admitting: *Deleted

## 2022-07-27 DIAGNOSIS — I10 Essential (primary) hypertension: Secondary | ICD-10-CM

## 2022-07-27 DIAGNOSIS — Z5181 Encounter for therapeutic drug level monitoring: Secondary | ICD-10-CM

## 2022-07-27 MED ORDER — SPIRONOLACTONE 25 MG PO TABS: 25.0000 mg | ORAL_TABLET | Freq: Every day | ORAL | 3 refills | Status: DC

## 2022-07-27 NOTE — Telephone Encounter (Signed)
Skeet Latch, MD to Me      07/27/22  5:01 PM OK.  Please try spironolactone '25mg'$  daily instead.  BMP in a week.    TCR   Advised patient, verbalized understanding

## 2022-07-27 NOTE — Telephone Encounter (Signed)
Called and reviewed labs with patient  She wanted Dr Oval Linsey to be aware that the HCTZ that was added is  not helping blood pressure Stated SBP still running in the 150's before and after medications Has follow up 9/12  Will forward to Dr Oval Linsey for review

## 2022-07-27 NOTE — Telephone Encounter (Signed)
Pt is returning call. Transferred to [place], [place].

## 2022-07-27 NOTE — Telephone Encounter (Signed)
Advised patient of lab results  Patient would like to discuss with Dr Oval Linsey at follow up 9/12

## 2022-08-07 ENCOUNTER — Encounter (HOSPITAL_BASED_OUTPATIENT_CLINIC_OR_DEPARTMENT_OTHER): Payer: Self-pay | Admitting: Family

## 2022-08-07 ENCOUNTER — Ambulatory Visit (HOSPITAL_BASED_OUTPATIENT_CLINIC_OR_DEPARTMENT_OTHER): Payer: Medicare Other | Admitting: Family

## 2022-08-07 VITALS — BP 160/74 | HR 81 | Ht 71.0 in | Wt 174.5 lb

## 2022-08-07 DIAGNOSIS — Z720 Tobacco use: Secondary | ICD-10-CM | POA: Diagnosis not present

## 2022-08-07 DIAGNOSIS — I10 Essential (primary) hypertension: Secondary | ICD-10-CM | POA: Diagnosis not present

## 2022-08-07 DIAGNOSIS — I25118 Atherosclerotic heart disease of native coronary artery with other forms of angina pectoris: Secondary | ICD-10-CM | POA: Diagnosis not present

## 2022-08-07 DIAGNOSIS — E785 Hyperlipidemia, unspecified: Secondary | ICD-10-CM | POA: Diagnosis not present

## 2022-08-07 DIAGNOSIS — K219 Gastro-esophageal reflux disease without esophagitis: Secondary | ICD-10-CM

## 2022-08-07 DIAGNOSIS — I358 Other nonrheumatic aortic valve disorders: Secondary | ICD-10-CM

## 2022-08-07 MED ORDER — PRAVASTATIN SODIUM 20 MG PO TABS: 20.0000 mg | ORAL_TABLET | ORAL | 3 refills | Status: DC

## 2022-08-07 NOTE — Progress Notes (Signed)
Office Visit    Patient Name: Christina Branch Date of Encounter: 08/09/2022  PCP:  Vincente Liberty, MD   [place]  Cardiologist:  Skeet Latch, MD  Advanced Practice Provider:  No care team member to display Electrophysiologist:  None    Chief Complaint    Christina Branch is a 81 y.o. female with a hx of hypertension, hyperlipidemia, emphysema, tobacco abuse, GERD, aortic valve calcification presents today for hypertension follow up.   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   Claudication ([place])    Coronary artery calcification 03/19/2022   DJD (degenerative joint disease) of knee    Essential hypertension 03/19/2022   GERD (gastroesophageal reflux disease)    H/O: varicose veins    Right Leg   Hyperlipidemia    Hypertension    Hypoxemia    Myalgia and myositis, unspecified    Pain in limb    Palpitations 07/04/2022   Peripheral neuropathy 03/07/2020   Varicose veins    Past Surgical History:  Procedure Laterality Date   CHOLECYSTECTOMY     Open cholecystectomy   COLON SURGERY     partial resection due to large polyps (Benign)   FRACTURE SURGERY  1953   ORIF right hip   HEMORRHOID SURGERY     INGUINAL HERNIA REPAIR N/A 08/31/2021   Procedure: PRIMARY REPAIR INCISIONAL HERNIA; EXCISION OF ABDOMINAL SKIN TAG;  Surgeon: Erroll Luna, MD;  Location: [facility];  Service: General;  Laterality: N/A;   LAPAROTOMY N/A 08/31/2021   Procedure: EXPLORATORY LAPAROTOMY;  Surgeon: Erroll Luna, MD;  Location: [facility];  Service: General;  Laterality: N/A;    Allergies  Allergies  Allergen Reactions   Penicillins Hives   Shellfish Allergy Swelling   Sulfa Antibiotics Nausea And Vomiting   Geralyn Flash [Fish Allergy] Swelling    History of Present Illness    Christina Branch is a 81 y.o. female with a hx of  hypertension, hyperlipidemia, emphysema, tobacco abuse, GERD, aortic valve  calcification last seen 07/04/22 by Dr. Oval Linsey.  She was seen in consult 03/19/2022 by Dr. Oval Linsey at the request of pulmonology.  Echo 07/2021 LVEF 41%, grade 1 diastolic dysfunction, normal pulmonary pressures.  Chest CT showed coronary and aortic atherosclerosis as well as adrenal adenoma.  She noted bilateral feet swelling with right greater than left which improved with elevation.  She was more short of breath with lying flat.  She was not exercising regularly due to arthritic pain in her knees and cervical spine.  Unable to take fluid pill due to incontinence.  Cooks predominantly at home.  Smokes 1 pack of cigarettes every 3 to 4 days.  Her BP was not well controlled and she was started on valsartan 160 mg daily.  Rosuvastatin 10 mg daily to achieve LDL goal less than 70.  At follow-up 03/2022 valsartan dose increased as blood pressure not at goal.  A follow-up 06/2022 BP remained above goal at home 140s.  Monitor was ordered due to palpitations.  She was started on 25 mg of hydrochlorothiazide.  Isosorbide discontinued.  Rosuvastatin held due to myalgias.  Repeat LDL 108.  She called the office 07/27/2022 noting blood pressure not well controlled on hydrochlorothiazide-it was discontinued and she was started on spironolactone 25 mg daily.  Monitor preliminary report predominantly normal sinus rhythm average heart rate 60 bpm.  20 runs of SVT fastest 5 beats 190 bpm the longest  was 13 beats 104 bpm.  PAC 1.5% burden and PVC less than 1% burden.  Lab work including TSH, renin-aldosterone, catecholamines, metanephrines were unremarkable.   She presents today for follow-up. No lightheadedness, dizziness.  Notes sensation of voice coming and going since July with no congestion or sore throat which her primary care provider has contributed to GERD though unchanged since starting Prilosec.  Since last seen shares with me that her sister-in-law was killed in a car wreck and offered my condolences.  Blood  pressure at home routinely 150s. Denies chest pain, dyspnea, palpitations.  Notes her leg pain has improved since holding rosuvastatin.  EKGs/Labs/Other Studies Reviewed:   The following studies were reviewed today:  CT Abdomen 08/31/21 IMPRESSION: 1. Dilated small bowel within lower abdominal ventral hernia with resultant small bowel obstruction. Findings are concerning for strangulated or incarcerated hernia. There is mesenteric edema present which can be seen with vascular compromise. No pneumatosis or free air. 2. Small amount of free fluid in the hernia and pelvis. 3. Cholecystectomy.  Stable biliary ductal dilatation. 4. Colonic diverticulosis. 5.  Aortic Atherosclerosis (ICD10-I70.0). 6. These results were called by telephone at the time of interpretation on 08/31/2021 at 5:07 pm to provider Fredia Sorrow , who verbally acknowledged these results.   Echo 08/21/21 1. Left ventricular ejection fraction, by estimation, is 70 to 75%. Left  ventricular ejection fraction by 3D volume is 72 %. The left ventricle has  hyperdynamic function. The left ventricle has no regional wall motion  abnormalities. Left ventricular  diastolic parameters are consistent with Grade I diastolic dysfunction  (impaired relaxation).   2. Right ventricular systolic function is normal. The right ventricular  size is normal. There is normal pulmonary artery systolic pressure. The  estimated right ventricular systolic pressure is 03.4 mmHg.   3. Left atrial size was mildly dilated.   4. The mitral valve is normal in structure. No evidence of mitral valve  regurgitation. No evidence of mitral stenosis.   5. The aortic valve is calcified. Aortic valve regurgitation is not  visualized. Mild to moderate aortic valve sclerosis/calcification is  present, without any evidence of aortic stenosis.   6. The inferior vena cava is normal in size with greater than 50%  respiratory variability, suggesting right atrial  pressure of 3 mmHg.     EKG: No EKG today  Recent Labs: 07/18/2022: ALT 12; Hemoglobin 13.6; Magnesium 2.0; Platelets 340; TSH 0.872 08/07/2022: BUN 19; Creatinine, Ser 1.11; Potassium 5.0; Sodium 141  Recent Lipid Panel    Component Value Date/Time   CHOL 191 06/21/2022 1621   TRIG 116 06/21/2022 1621   HDL 63 06/21/2022 1621   CHOLHDL 3.0 06/21/2022 1621   LDLCALC 108 (H) 06/21/2022 1621      Home Medications   Current Meds  Medication Sig   amLODipine (NORVASC) 10 MG tablet Take 10 mg by mouth daily.    diclofenac Sodium (VOLTAREN) 1 % GEL Apply 2 g topically daily as needed (pain).   HYDROcodone-ibuprofen (VICOPROFEN) 7.5-200 MG per tablet Take 1 tablet by mouth every 6 (six) hours as needed for moderate pain.    loratadine (CLARITIN) 10 MG tablet Take 10 mg by mouth daily as needed for allergies.   Multiple Vitamins-Minerals (CENTRUM SILVER PO) Take 1 tablet by mouth daily.    omeprazole (PRILOSEC) 20 MG capsule Take 1 capsule (20 mg total) by mouth daily.   pravastatin (PRAVACHOL) 20 MG tablet Take 1 tablet (20 mg total) by mouth 3 (three) times  a week.   spironolactone (ALDACTONE) 25 MG tablet Take 1 tablet (25 mg total) by mouth daily.   Vitamin D, Ergocalciferol, (DRISDOL) 50000 units CAPS capsule Take 50,000 Units by mouth every 7 (seven) days.     Review of Systems      All other systems reviewed and are otherwise negative except as noted above.  Physical Exam    VS:  BP (!) 160/74 (BP Location: Right Arm, Patient Position: Sitting, Cuff Size: Normal)   Pulse 81   Ht '5\' 11"'$  (1.803 m)   Wt 174 lb 8 oz (79.2 kg)   BMI 24.34 kg/m  , BMI Body mass index is 24.34 kg/m.  Wt Readings from Last 3 Encounters:  08/07/22 174 lb 8 oz (79.2 kg)  07/04/22 176 lb 9.6 oz (80.1 kg)  04/24/22 180 lb (81.6 kg)    GEN: Well nourished, well developed, in no acute distress. HEENT: normal. Neck: Supple, no JVD, carotid bruits, or masses. Cardiac: RRR, no  rubs, or gallops.  Gr 2/6 systolic murmur. No clubbing, cyanosis, edema.  Radials/PT 2+ and equal bilaterally.  Respiratory:  Respirations regular and unlabored, clear to auscultation bilaterally. GI: Soft, nontender, nondistended. MS: No deformity or atrophy. Skin: Warm and dry, no rash. Neuro:  Strength and sensation are intact. Psych: Normal affect.  Assessment & Plan    CAD /aortic atherosclerosis /HLD-noted by CT scan.  Predominantly LAD calcification.  Did not tolerate rosuvastatin 10 mg of leg cramping.  Will Rx pravastatin 20 mg 3 times per week.  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  Gradients normal by echo 07/2021.  Slight murmur stable on exam   HTN -BP goal less than 130/80.  Continue amlodipine 10 mg daily, spironolactone 25 mg daily.  Update BMP today.  If BMP at goal plan to increase dose.  Valsartan and Imdur previously ineffective.   Due to persistently elevated blood pressure plan for carotid duplex to rule out coarctation of the aorta and renal artery duplex to rule out renal artery stenosis. TSH, metanephrines, catecholamines unremarkable.  GERD -continue omeprazole and follow-up with primary care provider.  Tobacco abuse - Smoking cessation encouraged. Recommend utilization of [id number].   Disposition: Follow up in 6 week(s) with Skeet Latch, MD or APP.  Signed, Loel Dubonnet, NP 08/09/2022, 12:10 PM [place]

## 2022-08-07 NOTE — Patient Instructions (Addendum)
Medication Instructions:  Your physician has recommended you make the following change in your medication:   START Pravastatin one '20mg'$  tablet three times per week  Pending your lab work today we may adjust your blood pressure medications   *If you need a refill on your cardiac medications before your next appointment, please call your pharmacy*   Lab Work: Your physician recommends that you return for lab work today: BMP  If you have labs (blood work) drawn today and your tests are completely normal, you will receive your results only by: [place] (if you have MyChart) OR A paper copy in the mail If you have any lab test that is abnormal or we need to change your treatment, we will call you to review the results.   Testing/Procedures: Your physician has requested that you have a carotid duplex. This test is an ultrasound of the carotid arteries in your neck. It looks at blood flow through these arteries that supply the brain with blood. Allow one hour for this exam. There are no restrictions or special instructions.   Your physician has requested that you have a renal artery duplex. During this test, an ultrasound is used to evaluate blood flow to the kidneys. Allow one hour for this exam. Do not eat after midnight the day before and avoid carbonated beverages. Take your medications as you usually do.    Follow-Up: At [facility], you and your health needs are our priority.  As part of our continuing mission to provide you with exceptional heart care, we have created designated Provider Care Teams.  These Care Teams include your primary Cardiologist (physician) and Advanced Practice Providers (APPs -  Physician Assistants and Nurse Practitioners) who all work together to provide you with the care you need, when you need it.  We recommend signing up for the patient portal called "MyChart".  Sign up information is provided on this After Visit Summary.  MyChart is used to  connect with patients for Virtual Visits (Telemedicine).  Patients are able to view lab/test results, encounter notes, upcoming appointments, etc.  Non-urgent messages can be sent to your provider as well.   To learn more about what you can do with MyChart, go to [email or web address].    Your next appointment:   6 week(s)  The format for your next appointment:   In Person  Provider:   Skeet Latch, MD or Laurann Montana, NP    Other Instructions  Heart Healthy Diet Recommendations: A low-salt diet is recommended. Meats should be grilled, baked, or boiled. Avoid fried foods. Focus on lean protein sources like fish or chicken with vegetables and fruits. The American Heart Association is a Microbiologist!  American Heart Association Diet and Lifeystyle Recommendations .  Exercise recommendations: The American Heart Association recommends 150 minutes of moderate intensity exercise weekly. Try 30 minutes of moderate intensity exercise 4-5 times per week. This could include walking, jogging, or swimming.   Important Information About Sugar

## 2022-08-08 LAB — BASIC METABOLIC PANEL
BUN/Creatinine Ratio: 17 (ref 12–28)
BUN: 19 mg/dL (ref 8–27)
CO2: 22 mmol/L (ref 20–29)
Calcium: 11 mg/dL — ABNORMAL HIGH (ref 8.7–10.3)
Chloride: 105 mmol/L (ref 96–106)
Creatinine, Ser: 1.11 mg/dL — ABNORMAL HIGH (ref 0.57–1.00)
Glucose: 100 mg/dL — ABNORMAL HIGH (ref 70–99)
Potassium: 5 mmol/L (ref 3.5–5.2)
Sodium: 141 mmol/L (ref 134–144)
eGFR: 50 mL/min/{1.73_m2} — ABNORMAL LOW (ref >59)

## 2022-08-09 ENCOUNTER — Telehealth (HOSPITAL_BASED_OUTPATIENT_CLINIC_OR_DEPARTMENT_OTHER): Payer: Self-pay

## 2022-08-09 DIAGNOSIS — I25118 Atherosclerotic heart disease of native coronary artery with other forms of angina pectoris: Secondary | ICD-10-CM

## 2022-08-09 DIAGNOSIS — I10 Essential (primary) hypertension: Secondary | ICD-10-CM

## 2022-08-09 MED ORDER — CHLORTHALIDONE 25 MG PO TABS: 12.5000 mg | ORAL_TABLET | Freq: Every day | ORAL | 3 refills | Status: DC

## 2022-08-09 NOTE — Telephone Encounter (Addendum)
Results called to patient who verbalizes understanding! Labs ordered and mailed, prescriptions updated and sent to pharmacy on file .    ----- Message from Loel Dubonnet, NP sent at 08/09/2022 12:15 PM EDT ----- Kidney function overall stable.  Potassium normal although close to high end of normal.  Hesitant to further increase spironolactone.  Instead recommend start chlorthalidone 12.5 mg daily with BMP in 1 week for monitoring.

## 2022-08-23 ENCOUNTER — Telehealth (HOSPITAL_BASED_OUTPATIENT_CLINIC_OR_DEPARTMENT_OTHER): Payer: Self-pay

## 2022-08-23 DIAGNOSIS — I10 Essential (primary) hypertension: Secondary | ICD-10-CM

## 2022-08-23 LAB — BASIC METABOLIC PANEL
BUN/Creatinine Ratio: 16 (ref 12–28)
BUN: 22 mg/dL (ref 8–27)
CO2: 22 mmol/L (ref 20–29)
Calcium: 11.8 mg/dL — ABNORMAL HIGH (ref 8.7–10.3)
Chloride: 100 mmol/L (ref 96–106)
Creatinine, Ser: 1.38 mg/dL — ABNORMAL HIGH (ref 0.57–1.00)
Glucose: 118 mg/dL — ABNORMAL HIGH (ref 70–99)
Potassium: 4.6 mmol/L (ref 3.5–5.2)
Sodium: 140 mmol/L (ref 134–144)
eGFR: 38 mL/min/{1.73_m2} — ABNORMAL LOW (ref >59)

## 2022-08-23 NOTE — Telephone Encounter (Addendum)
Results called to patient who verbalizes understanding! Patient states that she noticed after starting Pravastatin her appetite has been reduced. Advised patient that this is not a known side effect.     ----- Message from Loel Dubonnet, NP sent at 08/23/2022  7:42 AM EDT ----- Kidney function slightly decreased from previous.  Ensure staying well-hydrated.  Recommend discontinue chlorthalidone as likely etiology of her decreased renal function.  Repeat BMP in 2 to 3 weeks.

## 2022-09-07 ENCOUNTER — Ambulatory Visit (INDEPENDENT_AMBULATORY_CARE_PROVIDER_SITE_OTHER): Payer: Medicare Other

## 2022-09-07 DIAGNOSIS — I10 Essential (primary) hypertension: Secondary | ICD-10-CM | POA: Diagnosis not present

## 2022-09-07 DIAGNOSIS — I6523 Occlusion and stenosis of bilateral carotid arteries: Secondary | ICD-10-CM

## 2022-09-10 ENCOUNTER — Telehealth (HOSPITAL_BASED_OUTPATIENT_CLINIC_OR_DEPARTMENT_OTHER): Payer: Self-pay

## 2022-09-10 DIAGNOSIS — I700 Atherosclerosis of aorta: Secondary | ICD-10-CM

## 2022-09-10 DIAGNOSIS — I2510 Atherosclerotic heart disease of native coronary artery without angina pectoris: Secondary | ICD-10-CM

## 2022-09-10 DIAGNOSIS — I358 Other nonrheumatic aortic valve disorders: Secondary | ICD-10-CM

## 2022-09-10 NOTE — Telephone Encounter (Addendum)
Results called to patient who verbalizes understanding!  Orders placed!    ----- Message from Loel Dubonnet, NP sent at 09/09/2022 11:56 AM EDT ----- Bilateral 1-39% stenosis. Repeat duplex in one year for monitoring.   No renal artery stenosis which is good!   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 If has abdominal pain after eating - refer to vascular surgery. If no abdominal pain after eating - repeat duplex in one year. She should contact our office if she has abdominal pain after eating.

## 2022-09-11 IMAGING — CT CT CHEST HIGH RESOLUTION W/O CM
2 of 8 series · 14 of 36 positions shown, 17 images · non-contrast
Comparison: 06/23/2021 chest radiograph.

CLINICAL DATA: Chronic dyspnea on exertion since [REDACTED]. Former
smoker.

EXAM:
CT CHEST WITHOUT CONTRAST
TECHNIQUE: Multidetector CT imaging of the chest was performed following the
standard protocol without intravenous contrast. High resolution
imaging of the lungs, as well as inspiratory and expiratory imaging,
was performed.

[Series 5: high resolution · axial · 0.64mm/px · z∈[-334,-42]mm · 11 of 353 slices shown, 14 images]
[im 30/353  mediastinal]
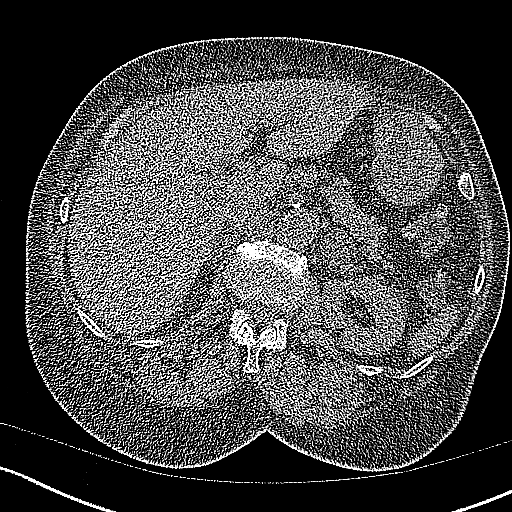
[im 30/353  lung]
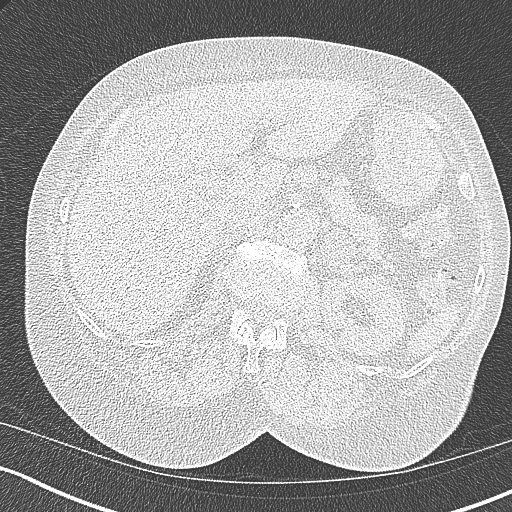
[im 59/353  lung]
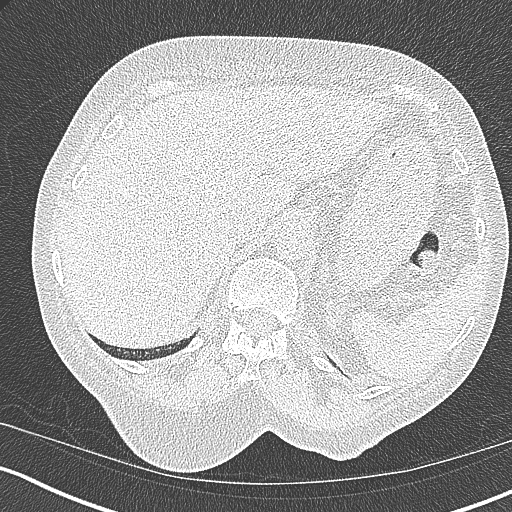
[im 89/353  lung]
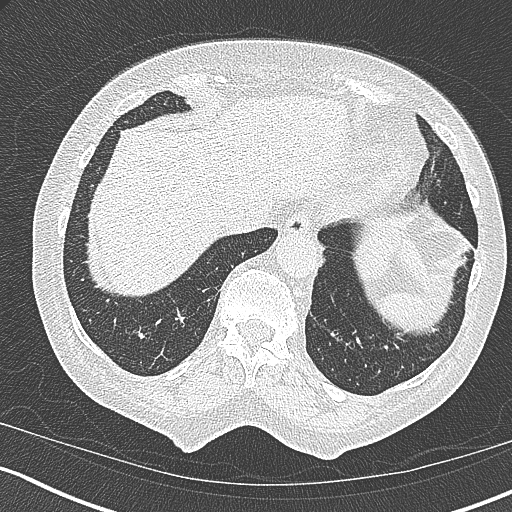
[im 118/353  lung]
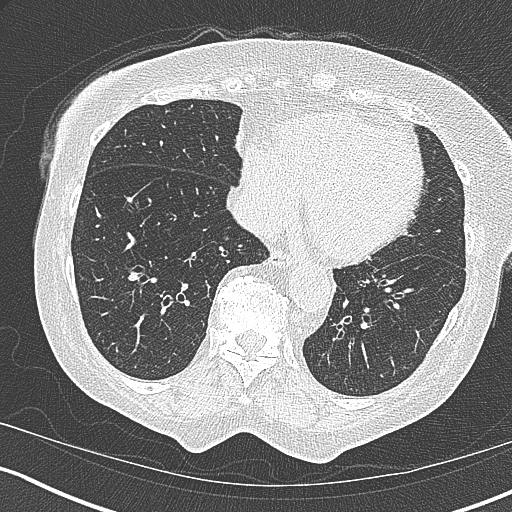
[im 147/353  mediastinal]
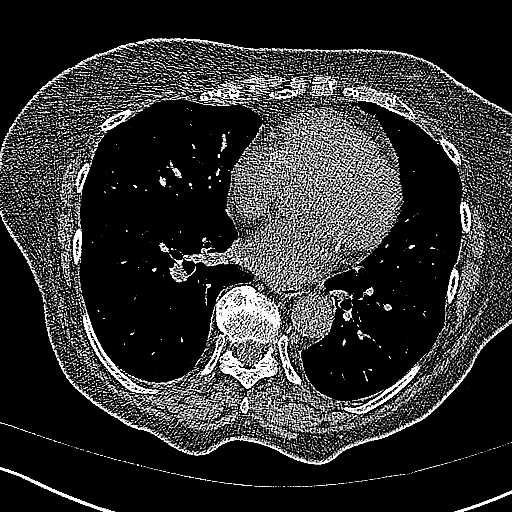
[im 147/353  lung]
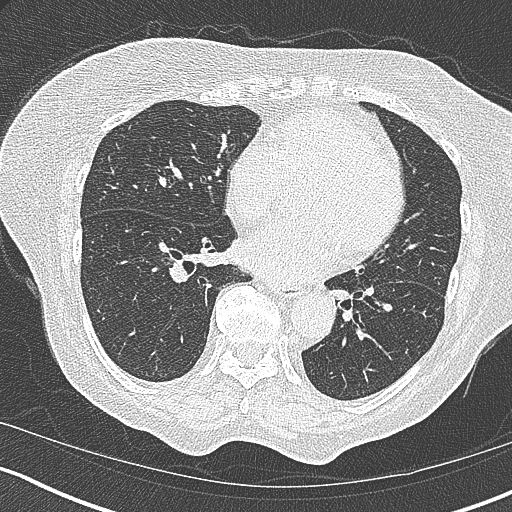
[im 177/353  lung]
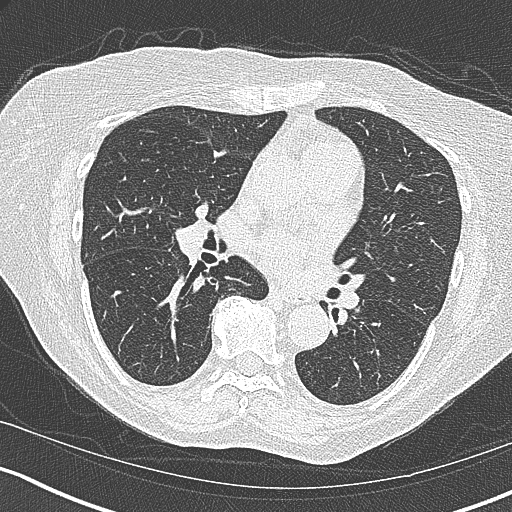
[im 206/353  lung]
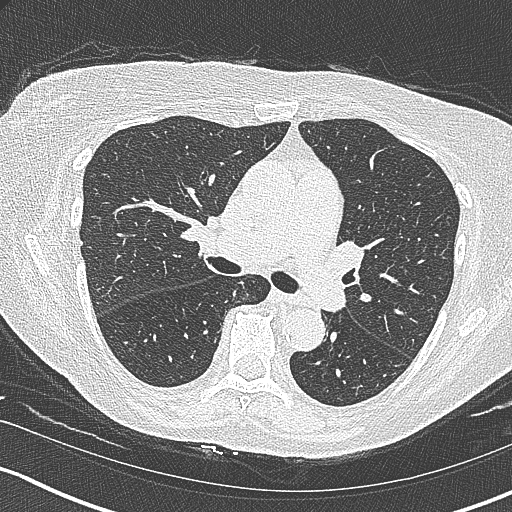
[im 235/353  lung]
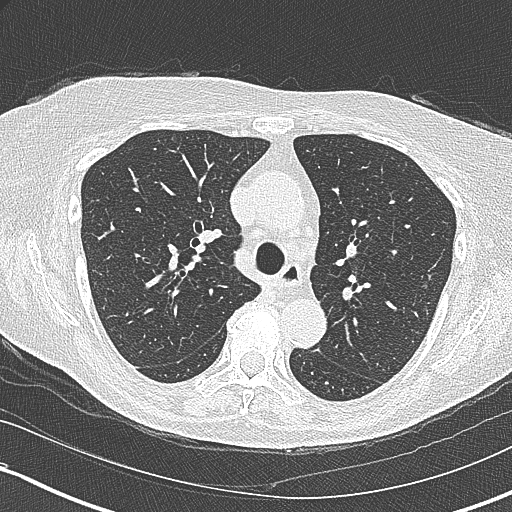
[im 265/353  mediastinal]
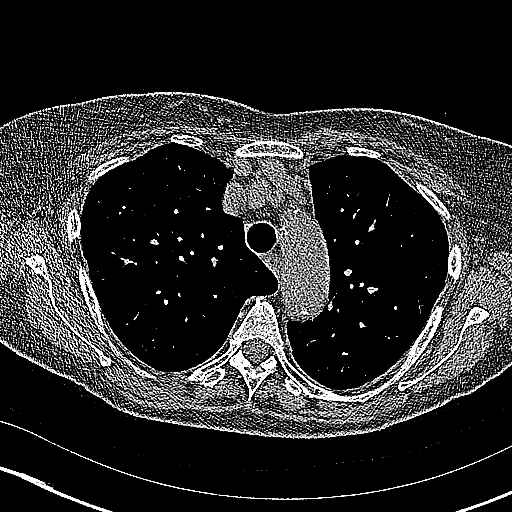
[im 265/353  lung]
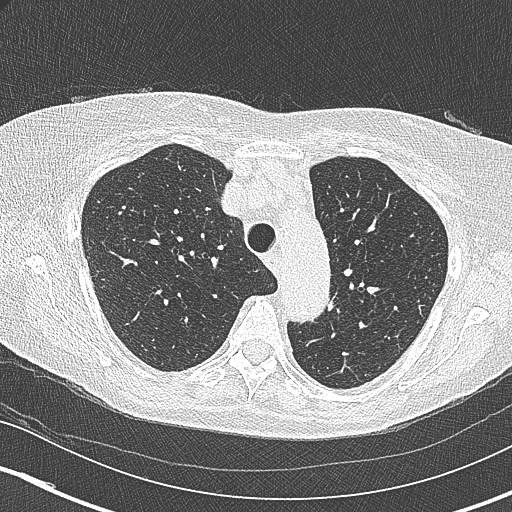
[im 294/353  lung]
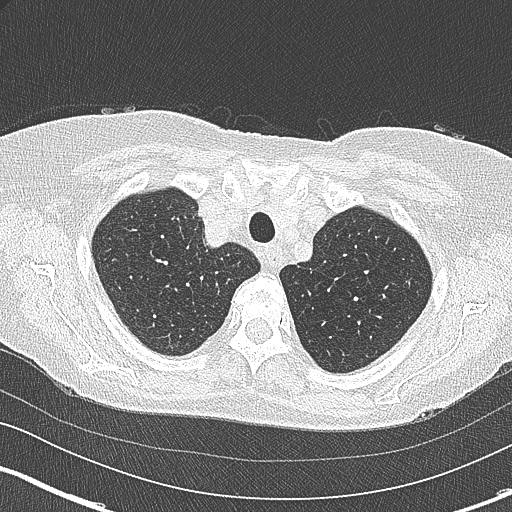
[im 323/353  lung]
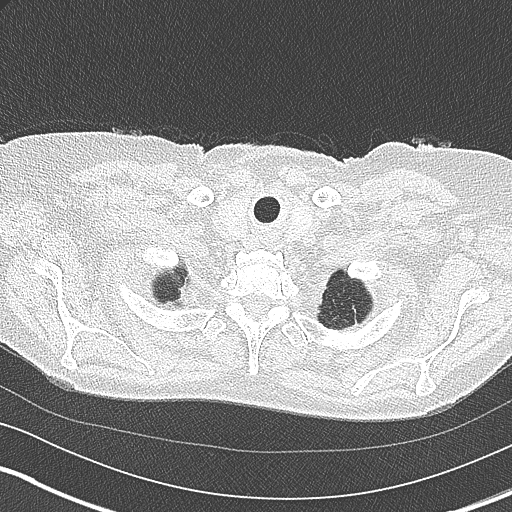

[Series 7: coronal · coronal · 0.63mm/px · 3 of 122 slices shown]
[im 25/122  lung]
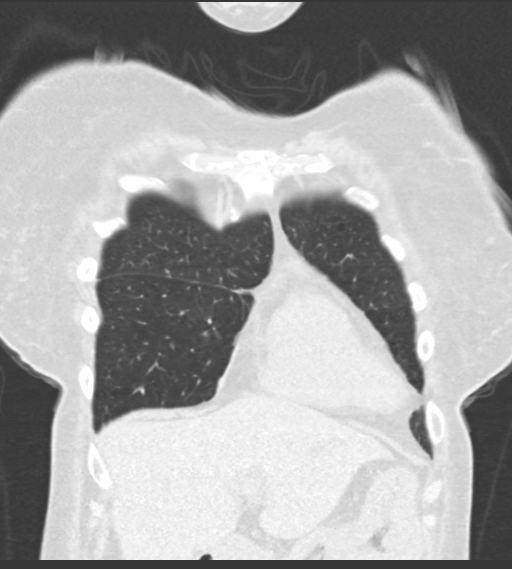
[im 49/122  lung]
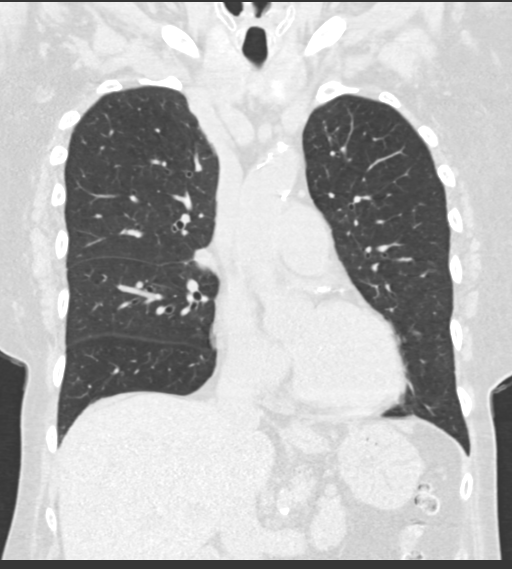
[im 73/122  lung]
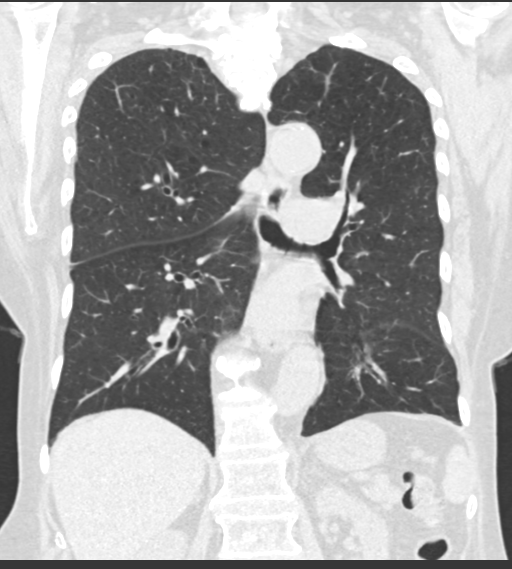

[14 of 36 positions shown; findings below may reference images not displayed]

FINDINGS: Cardiovascular: Normal heart size. No significant pericardial
effusion/thickening. Left anterior descending coronary
atherosclerosis. Atherosclerotic nonaneurysmal thoracic aorta.
Top-normal caliber main pulmonary artery (3.3 cm diameter).

Mediastinum/Nodes: Hypodense bilateral thyroid nodules, largest
cm on the right. Not clinically significant; no follow-up imaging
recommended (ref: [HOSPITAL]. [DATE]): 143-50).
Unremarkable esophagus. No pathologically enlarged axillary,
mediastinal or hilar lymph nodes, noting limited sensitivity for the
detection of hilar adenopathy on this noncontrast study.

Lungs/Pleura: No pneumothorax. No pleural effusion. Mild
centrilobular emphysema. No acute consolidative airspace disease,
lung masses or significant pulmonary nodules. Generalized mild
cylindrical bronchiolectasis in the basilar lower lobes bilaterally,
left greater than right, and in the medial right middle lobe and
inferior lingula. No significant regions of subpleural reticulation,
ground-glass attenuation, architectural distortion or frank
honeycombing. No significant lobular air trapping or evidence of
tracheobronchomalacia on the expiration sequence. Tiny calcified
peripheral right upper lobe granuloma.

Upper abdomen: Mild diffuse intrahepatic biliary ductal dilatation.
Left adrenal 2.2 cm nodule with density -12 HU, compatible with an
adenoma. Subcentimeter angiomyolipoma in the posteromedial upper
left kidney. Colonic diverticulosis.

Musculoskeletal: No aggressive appearing focal osseous lesions.
Moderate thoracic spondylosis.
IMPRESSION: 1. No evidence of interstitial lung disease.
2. Mild cylindrical bronchiolectasis in the mid to lower lungs
bilaterally, left greater than right.
3. One vessel coronary atherosclerosis.
4. Mild diffuse intrahepatic biliary ductal dilatation, presumably
due to chronic post cholecystectomy effect. Recommend correlation
with serum bilirubin levels.
5. Left adrenal adenoma.
6. Colonic diverticulosis.
7. Aortic Atherosclerosis (5M9I4-6T2.2) and Emphysema ([id number].A).

## 2022-09-19 ENCOUNTER — Telehealth (HOSPITAL_BASED_OUTPATIENT_CLINIC_OR_DEPARTMENT_OTHER): Payer: Self-pay

## 2022-09-19 LAB — BASIC METABOLIC PANEL
BUN/Creatinine Ratio: 20 (ref 12–28)
BUN: 20 mg/dL (ref 8–27)
CO2: 23 mmol/L (ref 20–29)
Calcium: 10.9 mg/dL — ABNORMAL HIGH (ref 8.7–10.3)
Chloride: 106 mmol/L (ref 96–106)
Creatinine, Ser: 1.01 mg/dL — ABNORMAL HIGH (ref 0.57–1.00)
Glucose: 101 mg/dL — ABNORMAL HIGH (ref 70–99)
Potassium: 4.3 mmol/L (ref 3.5–5.2)
Sodium: 143 mmol/L (ref 134–144)
eGFR: 56 mL/min/{1.73_m2} — ABNORMAL LOW (ref >59)

## 2022-09-19 NOTE — Telephone Encounter (Addendum)
Called results to patient and left results on VM (ok per DPR), instructions left to call office back if patient has any questions!      ----- Message from Loel Dubonnet, NP sent at 09/19/2022  7:55 AM EDT ----- Kidney function improving. Good result! Continue same medications. If she will provide [place] a BP update, please.

## 2022-09-21 NOTE — Telephone Encounter (Signed)
2nd call attempt, no answer, left message with instructions to call us back with her BP log.     "----- Message from Loel Dubonnet, NP sent at 09/19/2022  7:55 AM EDT ----- Kidney function improving. Good result! Continue same medications. If she will provide [place] a BP update, please."

## 2022-09-24 NOTE — Progress Notes (Unsigned)
Office Visit    Patient Name: Christina Branch Date of Encounter: 09/25/2022  PCP:  Vincente Liberty, [place]  Cardiologist:  Skeet Latch, MD  Advanced Practice Provider:  No care team member to display Electrophysiologist:  None   Chief Complaint    Christina Branch is a 81 y.o. female presents today for hypertension follow up.   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   Claudication ([place])    Coronary artery calcification 03/19/2022   DJD (degenerative joint disease) of knee    Essential hypertension 03/19/2022   GERD (gastroesophageal reflux disease)    H/O: varicose veins    Right Leg   Hyperlipidemia    Hypertension    Hypoxemia    Myalgia and myositis, unspecified    Pain in limb    Palpitations 07/04/2022   Peripheral neuropathy 03/07/2020   Varicose veins    Past Surgical History:  Procedure Laterality Date   CHOLECYSTECTOMY     Open cholecystectomy   COLON SURGERY     partial resection due to large polyps (Benign)   FRACTURE SURGERY  1953   ORIF right hip   HEMORRHOID SURGERY     INGUINAL HERNIA REPAIR N/A 08/31/2021   Procedure: PRIMARY REPAIR INCISIONAL HERNIA; EXCISION OF ABDOMINAL SKIN TAG;  Surgeon: Erroll Luna, MD;  Location: [facility];  Service: General;  Laterality: N/A;   LAPAROTOMY N/A 08/31/2021   Procedure: EXPLORATORY LAPAROTOMY;  Surgeon: Erroll Luna, MD;  Location: [facility];  Service: General;  Laterality: N/A;    Allergies  Allergies  Allergen Reactions   Penicillins Hives   Shellfish Allergy Swelling   Sulfa Antibiotics Nausea And Vomiting   Geralyn Flash [Fish Allergy] Swelling   History of Present Illness    Christina Branch is a 81 y.o. female with a hx of  hypertension, hyperlipidemia, emphysema, tobacco abuse, GERD, aortic valve calcification last seen 08/07/22.  She was seen in consult 03/19/2022 by Dr. Oval Linsey at the request of pulmonology.   Echo 07/2021 LVEF 39%, grade 1 diastolic dysfunction, normal pulmonary pressures.  Chest CT showed coronary and aortic atherosclerosis as well as adrenal adenoma.  She noted bilateral feet swelling with right greater than left which improved with elevation.  She was more short of breath with lying flat.  She was not exercising regularly due to arthritic pain in her knees and cervical spine.  Unable to take fluid pill due to incontinence.  Cooks predominantly at home.  Smokes 1 pack of cigarettes every 3 to 4 days.  Her BP was not well controlled and she was started on valsartan 160 mg daily.  Rosuvastatin 10 mg daily to achieve LDL goal less than 70.  At follow-up 03/2022 valsartan dose increased as blood pressure not at goal.  A follow-up 06/2022 BP remained above goal at home 140s.  Monitor was ordered due to palpitations.  She was started on 25 mg of hydrochlorothiazide.  Isosorbide discontinued.  Rosuvastatin held due to myalgias.  Repeat LDL 108.  She called the office 07/27/2022 noting blood pressure not well controlled on hydrochlorothiazide-it was discontinued and she was started on spironolactone 25 mg daily.  Monitor preliminary report predominantly normal sinus rhythm average heart rate 60 bpm.  20 runs of SVT fastest 5 beats 190 bpm the longest was 13 beats 104 bpm.  PAC 1.5% burden and PVC less than 1% burden.  Lab work  including TSH, renin-aldosterone, catecholamines, metanephrines were unremarkable.   Seen 08/07/22. She was started on Chlorthalidone but had to be stopped due to decline in renal function. Carotid duplex  with bilateral 1-39% stenosis in carotid arteries and 70-99% stenosis in mesenteric arteries which was asymptomatic.   She presents today for follow-up. BP at home has been 140s. Did miss her medication one day had had a reading in the 150s. Notes higher today she she just left visiting her brother who is in the hospital. She notes leg cramps and she stopped Pravastatin and  symptoms improved. Notes symptoms of yeast infection which has been ongoing since September per her report. She is not interested in additional oral medications at this time.   Previous antihypertensives: Valsartan -  kidney decline, ineffective Hydralazine - swelling, increased urine output Chlorthalidone - kidney decline Isosorbide - ineffective   EKGs/Labs/Other Studies Reviewed:   The following studies were reviewed today:  CT Abdomen 08/31/21 IMPRESSION: 1. Dilated small bowel within lower abdominal ventral hernia with resultant small bowel obstruction. Findings are concerning for strangulated or incarcerated hernia. There is mesenteric edema present which can be seen with vascular compromise. No pneumatosis or free air. 2. Small amount of free fluid in the hernia and pelvis. 3. Cholecystectomy.  Stable biliary ductal dilatation. 4. Colonic diverticulosis. 5.  Aortic Atherosclerosis (ICD10-I70.0). 6. These results were called by telephone at the time of interpretation on 08/31/2021 at 5:07 pm to provider Fredia Sorrow , who verbally acknowledged these results.   Echo 08/21/21 1. Left ventricular ejection fraction, by estimation, is 70 to 75%. Left  ventricular ejection fraction by 3D volume is 72 %. The left ventricle has  hyperdynamic function. The left ventricle has no regional wall motion  abnormalities. Left ventricular  diastolic parameters are consistent with Grade I diastolic dysfunction  (impaired relaxation).   2. Right ventricular systolic function is normal. The right ventricular  size is normal. There is normal pulmonary artery systolic pressure. The  estimated right ventricular systolic pressure is 31.5 mmHg.   3. Left atrial size was mildly dilated.   4. The mitral valve is normal in structure. No evidence of mitral valve  regurgitation. No evidence of mitral stenosis.   5. The aortic valve is calcified. Aortic valve regurgitation is not  visualized. Mild  to moderate aortic valve sclerosis/calcification is  present, without any evidence of aortic stenosis.   6. The inferior vena cava is normal in size with greater than 50%  respiratory variability, suggesting right atrial pressure of 3 mmHg.     EKG: No EKG today  Recent Labs: 07/18/2022: ALT 12; Hemoglobin 13.6; Magnesium 2.0; Platelets 340; TSH 0.872 09/18/2022: BUN 20; Creatinine, Ser 1.01; Potassium 4.3; Sodium 143  Recent Lipid Panel    Component Value Date/Time   CHOL 191 06/21/2022 1621   TRIG 116 06/21/2022 1621   HDL 63 06/21/2022 1621   CHOLHDL 3.0 06/21/2022 1621   LDLCALC 108 (H) 06/21/2022 1621   Home Medications   Current Meds  Medication Sig   amLODipine (NORVASC) 10 MG tablet Take 10 mg by mouth daily.    diclofenac Sodium (VOLTAREN) 1 % GEL Apply 2 g topically daily as needed (pain).   HYDROcodone-ibuprofen (VICOPROFEN) 7.5-200 MG per tablet Take 1 tablet by mouth every 6 (six) hours as needed for moderate pain.    loratadine (CLARITIN) 10 MG tablet Take 10 mg by mouth daily as needed for allergies.   Multiple Vitamins-Minerals (CENTRUM SILVER PO) Take 1 tablet by  mouth daily.    spironolactone (ALDACTONE) 25 MG tablet Take 1 tablet (25 mg total) by mouth daily.   Vitamin D, Ergocalciferol, (DRISDOL) 50000 units CAPS capsule Take 50,000 Units by mouth every 7 (seven) days.   [DISCONTINUED] pravastatin (PRAVACHOL) 20 MG tablet Take 1 tablet (20 mg total) by mouth 3 (three) times a week.    Review of Systems      All other systems reviewed and are otherwise negative except as noted above.  Physical Exam    VS:  BP (!) 185/88 (BP Location: Right Arm, Patient Position: Sitting, Cuff Size: Normal)   Pulse 71   Ht '5\' 11"'$  (1.803 m)   Wt 172 lb 4.8 oz (78.2 kg)   SpO2 96%   BMI 24.03 kg/m  , BMI Body mass index is 24.03 kg/m.  Wt Readings from Last 3 Encounters:  09/25/22 172 lb 4.8 oz (78.2 kg)  08/07/22 174 lb 8 oz (79.2 kg)  07/04/22 176 lb 9.6 oz (80.1  kg)    GEN: Well nourished, well developed, in no acute distress. HEENT: normal. Neck: Supple, no JVD, carotid bruits, or masses. Cardiac: RRR, no  rubs, or gallops. Gr 2/6 systolic murmur. No clubbing, cyanosis, edema.  Radials/PT 2+ and equal bilaterally.  Respiratory:  Respirations regular and unlabored, clear to auscultation bilaterally. GI: Soft, nontender, nondistended. MS: No deformity or atrophy. Skin: Warm and dry, no rash. Neuro:  Strength and sensation are intact. Psych: Normal affect.  Assessment & Plan    CAD /aortic atherosclerosis /HLD-noted by CT scan.  Predominantly LAD calcification.  Did not tolerate rosuvastatin 10 mg of leg cramping. Since pravastatin 20 mg 3 times per week noted leg cramps which resolved with discontinuation.  Will remove from her med list.  Offered Zetia or referral to lipid clinic.  She declines both and prefers to manage with lifestyle changes.  We discussed that GDMT for coronary artery disease, carotid and mesenteric artery stenosis includes lipid-lowering therapy though she again declines.  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  Gradients normal by echo 07/2021.  Slight murmur stable on exam.  No indication for repeat echo at this time.  HTN -BP goal less than 130/80.  Valsartan and Imdur previously ineffective.  Did not tolerate Hydralazine. Chlorthalidone discontinued due to renal function. BP at home 140s. She declines additional medication changes today despite discussion of BP goal <130/80- discussed possible addition of Zetia. 06/2022 TSH, metanephrines, catecholamines unremarkable. 08/2022 no renal artery stenosis.  Yeast infection - Offered diflucan which she declined. Recommended OTC monostat cream. Further follow up with PCP.   Carotid and mesenteric artery stenosis - Duplex 08/05/2022 bilateral 1-39% stenosis in carotid arteries and 70-99% stenosis of mesenteric artery. Repeat duplex in one year already  ordered. Lipid management as above.   GERD -continue omeprazole and follow-up with primary care provider.  Tobacco abuse - Smoking cessation encouraged. Recommend utilization of [id number].   Disposition: Follow up in 4 month(s)  with Skeet Latch, MD or APP.  Signed, Loel Dubonnet, NP 09/25/2022, 3:32 PM [facility]

## 2022-09-25 ENCOUNTER — Ambulatory Visit (HOSPITAL_BASED_OUTPATIENT_CLINIC_OR_DEPARTMENT_OTHER): Payer: Medicare Other | Admitting: Family

## 2022-09-25 ENCOUNTER — Encounter (HOSPITAL_BASED_OUTPATIENT_CLINIC_OR_DEPARTMENT_OTHER): Payer: Self-pay | Admitting: Family

## 2022-09-25 VITALS — BP 185/88 | HR 71 | Ht 71.0 in | Wt 172.3 lb

## 2022-09-25 DIAGNOSIS — I6523 Occlusion and stenosis of bilateral carotid arteries: Secondary | ICD-10-CM | POA: Diagnosis not present

## 2022-09-25 DIAGNOSIS — I10 Essential (primary) hypertension: Secondary | ICD-10-CM | POA: Diagnosis not present

## 2022-09-25 DIAGNOSIS — Z720 Tobacco use: Secondary | ICD-10-CM | POA: Diagnosis not present

## 2022-09-25 DIAGNOSIS — I700 Atherosclerosis of aorta: Secondary | ICD-10-CM

## 2022-09-25 DIAGNOSIS — I358 Other nonrheumatic aortic valve disorders: Secondary | ICD-10-CM

## 2022-09-25 DIAGNOSIS — K219 Gastro-esophageal reflux disease without esophagitis: Secondary | ICD-10-CM

## 2022-09-25 DIAGNOSIS — I25118 Atherosclerotic heart disease of native coronary artery with other forms of angina pectoris: Secondary | ICD-10-CM

## 2022-09-25 NOTE — Telephone Encounter (Signed)
Unable to reach patient via phone call, patient to be seen today in clinic, removing from basket.

## 2022-09-25 NOTE — Patient Instructions (Addendum)
Medication Instructions:  None  *If you need a refill on your cardiac medications before your next appointment, please call your pharmacy*   Lab Work: None    Testing/Procedures: None   Follow-Up: At [facility], you and your health needs are our priority.  As part of our continuing mission to provide you with exceptional heart care, we have created designated Provider Care Teams.  These Care Teams include your primary Cardiologist (physician) and Advanced Practice Providers (APPs -  Physician Assistants and Nurse Practitioners) who all work together to provide you with the care you need, when you need it.  We recommend signing up for the patient portal called "MyChart".  Sign up information is provided on this After Visit Summary.  MyChart is used to connect with patients for Virtual Visits (Telemedicine).  Patients are able to view lab/test results, encounter notes, upcoming appointments, etc.  Non-urgent messages can be sent to your provider as well.   To learn more about what you can do with MyChart, go to [email or web address].    Your next appointment:   4 month(s)  The format for your next appointment:   In Person  Provider:   Skeet Latch, MD    Other Instructions None  Exercise recommendations: The American Heart Association recommends 150 minutes of moderate intensity exercise weekly. Try 30 minutes of moderate intensity exercise 4-5 times per week. This could include walking, jogging, or swimming.   Heart Healthy Diet Recommendations: A low-salt diet is recommended. Meats should be grilled, baked, or boiled. Avoid fried foods. Focus on lean protein sources like fish or chicken with vegetables and fruits. The American Heart Association is a Microbiologist!  American Heart Association Diet and Lifeystyle Recommendations     May try Monostat cream over the counter for yeast infection.  We would recommend Zetia (Ezetimibe) for cholesterol. It helps  to lower cholesterol but does not cause muscle cramps the way statin medications can.   We would recommend adding Doxazosin to get your blood pressure to goal of less than 130/80.  If you wish to start either of these medications, please call our office.

## 2023-01-28 ENCOUNTER — Ambulatory Visit (HOSPITAL_BASED_OUTPATIENT_CLINIC_OR_DEPARTMENT_OTHER): Payer: Medicare Other | Admitting: Cardiovascular Disease

## 2023-01-28 NOTE — Progress Notes (Incomplete)
Cardiology Office Note:    Date:  01/28/2023   ID:  Christina Branch, DOB [birth year], MRN [id number]  PCP:  Vincente Liberty, MD  Cardiologist:  Skeet Latch, MD    Referring MD: Vincente Liberty, MD   No chief complaint on file.   History of Present Illness:    Christina Branch is a 82 y.o. female with a hx of hypertension, hyperlipidemia, emphysema, tobacco abuse, and GERD here today for follow-up.  She was initially seen 02/2022 for the evaluation and management of aortic valve calcification and chest pain. She saw Dr. Chase Caller 01/2021 and reported worsening shortness of breath. The SOB was worse with exertion and she denied chest pain. She declined an inhaler. Echo 07/2021 showed LVEF 72% with grade 1 diastolic dysfunction and normal pulmonary pressures. Chest CT showed coronary and aortic atherosclerosis as well as an adrenal adenoma.   At her first visit her BP was uncontrolled, so valsartan was added. She was also started on rosuvastatin. She followed up with Laurann Montana, NP 03/2022 and complained of GERD that improved with omeprazole. Her blood pressure was uncontrolled and she had leg cramping. Valsartan was increased.  On 06/2022 she reported frequent episodes of palpitations. She wore a monitor that showed 20 episodes of SVT and metoprolol was recommended. She followed up with Laurann Montana, NP and her blood pressure was above goal. There was concern for coarctation and she was referred for carotid dopplers that showed mild stenosis bilaterally. 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 At her visit 08/2022 blood pressure remained uncontrolled but she did not want to try any additional medications. Smoking cessation was again encouraged.  Today,  She denies any palpitations, chest pain, shortness of breath, or peripheral edema. No lightheadedness, headaches, syncope, orthopnea, or PND.  (+)   Past Medical History:  Diagnosis Date   Aortic  valve sclerosis 03/19/2022   Claudication ([place])    Coronary artery calcification 03/19/2022   DJD (degenerative joint disease) of knee    Essential hypertension 03/19/2022   GERD (gastroesophageal reflux disease)    H/O: varicose veins    Right Leg   Hyperlipidemia    Hypertension    Hypoxemia    Myalgia and myositis, unspecified    Pain in limb    Palpitations 07/04/2022   Peripheral neuropathy 03/07/2020   Varicose veins     Past Surgical History:  Procedure Laterality Date   CHOLECYSTECTOMY     Open cholecystectomy   COLON SURGERY     partial resection due to large polyps (Benign)   FRACTURE SURGERY  1953   ORIF right hip   HEMORRHOID SURGERY     INGUINAL HERNIA REPAIR N/A 08/31/2021   Procedure: PRIMARY REPAIR INCISIONAL HERNIA; EXCISION OF ABDOMINAL SKIN TAG;  Surgeon: Erroll Luna, MD;  Location: [facility];  Service: General;  Laterality: N/A;   LAPAROTOMY N/A 08/31/2021   Procedure: EXPLORATORY LAPAROTOMY;  Surgeon: Erroll Luna, MD;  Location: [facility];  Service: General;  Laterality: N/A;    Current Medications: No outpatient medications have been marked as taking for the 01/28/23 encounter (Appointment) with Skeet Latch, MD.     Allergies:   Penicillins, Shellfish allergy, Sulfa antibiotics, and Blain Pais allergy]   Social History   Socioeconomic History   Marital status: Divorced    Spouse name: Not on file   Number of children: Not on file   Years of education: Not on file   Highest education level: Not on file  Occupational History   Not on file  Tobacco Use   Smoking status: Every Day    Packs/day: 0.50    Years: 40.00    Total pack years: 20.00    Types: Cigarettes    Start date: 11/26/1962   Smokeless tobacco: Never   Tobacco comments:    Currently smoking 2-3 cigs occasionally as of 01/25/22  ep  Vaping Use   Vaping Use: Never used  Substance and Sexual Activity   Alcohol use: No   Drug use: No   Sexual activity: Not on file  Other Topics  Concern   Not on file  Social History Narrative   Not on file   Social Determinants of Health   Financial Resource Strain: Low Risk  (03/19/2022)   Overall Financial Resource Strain (CARDIA)    Difficulty of Paying Living Expenses: Not hard at all  Food Insecurity: No Food Insecurity (03/19/2022)   Hunger Vital Sign    Worried About Running Out of Food in the Last Year: Never true    Ran Out of Food in the Last Year: Never true  Transportation Needs: No Transportation Needs (03/19/2022)   PRAPARE - Hydrologist (Medical): No    Lack of Transportation (Non-Medical): No  Physical Activity: Inactive (03/19/2022)   Exercise Vital Sign    Days of Exercise per Week: 0 days    Minutes of Exercise per Session: 0 min  Stress: Not on file  Social Connections: Not on file     Family History: The patient's family history includes Breast cancer in her sister; Cancer in her mother; Hypertension in her son.  ROS:   Please see the history of present illness.     All other systems reviewed and negative.   EKGs/Labs/Other Studies Reviewed:    The following studies were reviewed today:  Bilateral Renal Artery Doppler  09/07/2022: Summary:  Largest Aortic Diameter: 2.0 cm    Renal:    Right: Normal size right kidney. Normal right Resisitive Index.         Normal cortical thickness of right kidney. No evidence of         right renal artery stenosis. RRV flow present.  Left:  Normal size of left kidney. Normal left Resistive Index.         Normal cortical thickness of the left kidney. No evidence of         left renal artery stenosis. LRV flow present.  Mesenteric:  70 to 99% stenosis in the superior mesenteric artery.   Bilateral Carotid Doppler  09/07/2022 Summary:  Right Carotid: Velocities in the right ICA are consistent with a 1-39%  stenosis.   Left Carotid: Velocities in the left ICA are consistent with a 1-39%  stenosis.   Vertebrals: Bilateral  vertebral arteries demonstrate antegrade flow.  Subclavians: Normal flow hemodynamics were seen in bilateral subclavian               arteries.   Monitor  07/2022: 14 Day Zio Monitor   Quality: Fair.  Baseline artifact. Predominant rhythm: sinus rhythm Average heart rate: 60 bpm Max heart rate: 121 bpm Min heart rate: 40 bpm Pauses >2.5 seconds: none   Occasional PACs 1.5% Occasional PVCs 1.8% Rare ventricular bigeminy   20 episodes of SVT up to 13 beats.  Maximum rate 190 bpm  CT Abdomen 08/31/21 IMPRESSION: 1. Dilated small bowel within lower abdominal ventral hernia with resultant small bowel obstruction. Findings are concerning for strangulated  or incarcerated hernia. There is mesenteric edema present which can be seen with vascular compromise. No pneumatosis or free air. 2. Small amount of free fluid in the hernia and pelvis. 3. Cholecystectomy.  Stable biliary ductal dilatation. 4. Colonic diverticulosis. 5.  Aortic Atherosclerosis (ICD10-I70.0). 6. These results were called by telephone at the time of interpretation on 08/31/2021 at 5:07 pm to provider Fredia Sorrow , who verbally acknowledged these results.  Echo 08/21/21 1. Left ventricular ejection fraction, by estimation, is 70 to 75%. Left  ventricular ejection fraction by 3D volume is 72 %. The left ventricle has  hyperdynamic function. The left ventricle has no regional wall motion  abnormalities. Left ventricular  diastolic parameters are consistent with Grade I diastolic dysfunction  (impaired relaxation).   2. Right ventricular systolic function is normal. The right ventricular  size is normal. There is normal pulmonary artery systolic pressure. The  estimated right ventricular systolic pressure is [id number] mmHg.   3. Left atrial size was mildly dilated.   4. The mitral valve is normal in structure. No evidence of mitral valve  regurgitation. No evidence of mitral stenosis.   5. The aortic valve is  calcified. Aortic valve regurgitation is not  visualized. Mild to moderate aortic valve sclerosis/calcification is  present, without any evidence of aortic stenosis.   6. The inferior vena cava is normal in size with greater than 50%  respiratory variability, suggesting right atrial pressure of 3 mmHg.   EKG:EKG is personally reviewed.  01/28/2023:  *** 07/04/2022: EKG was not ordered.  03/19/22: Sinus rhythm rate 62  Recent Labs: 07/18/2022: ALT 12; Hemoglobin 13.6; Magnesium 2.0; Platelets 340; TSH 0.872 09/18/2022: BUN 20; Creatinine, Ser 1.01; Potassium 4.3; Sodium 143   Recent Lipid Panel    Component Value Date/Time   CHOL 191 06/21/2022 1621   TRIG 116 06/21/2022 1621   HDL 63 06/21/2022 1621   CHOLHDL 3.0 06/21/2022 1621   LDLCALC 108 (H) 06/21/2022 1621    Physical Exam:    VS:  There were no vitals taken for this visit. , BMI There is no height or weight on file to calculate BMI. GENERAL:  Well appearing HEENT: Pupils equal round and reactive, fundi not visualized, oral mucosa unremarkable NECK:  No jugular venous distention, waveform within normal limits, carotid upstroke brisk and symmetric, no bruits, no thyromegaly LUNGS:  Clear to auscultation bilaterally HEART:  RRR.  PMI not displaced or sustained,S1 and S2 within normal limits, no S3, no S4, no clicks, no rubs, ***2/6 systolic murmur at the LUSB ABD:  Flat, positive bowel sounds normal in frequency in pitch, no bruits, no rebound, no guarding, no midline pulsatile mass, no hepatomegaly, no splenomegaly EXT:  ***1+ DP/TP bilaterally, ***trace LE edema, no cyanosis no clubbing SKIN:  No rashes no nodules NEURO:  Cranial nerves II through XII grossly intact, motor grossly intact throughout PSYCH:  Cognitively intact, oriented to person place and time  ASSESSMENT:    No diagnosis found.  PLAN:    No problem-specific Assessment & Plan notes found for this encounter.  ***Plan: -  Disposition:  FU with Tiffany  C. Oval Linsey, MD, [facility] in *** months.  Medication Adjustments/Labs and Tests Ordered: Current medicines are reviewed at length with the patient today.  Concerns regarding medicines are outlined above.   No orders of the defined types were placed in this encounter.  No orders of the defined types were placed in this encounter.  I,Mathew Stumpf,acting as a Education administrator for [employer] [place],  MD.,have documented all relevant documentation on the behalf of Skeet Latch, MD,as directed by  Skeet Latch, MD while in the presence of Skeet Latch, MD.  I, [place] Oval Linsey, MD have reviewed all documentation for this visit.  The documentation of the exam, diagnosis, procedures, and orders on 01/28/2023 are all accurate and complete.  Waynetta Pean  01/28/2023 2:14 PM    [facility]

## 2023-02-08 ENCOUNTER — Ambulatory Visit: Payer: Medicare Other | Admitting: Surgical

## 2023-02-08 DIAGNOSIS — M170 Bilateral primary osteoarthritis of knee: Secondary | ICD-10-CM

## 2023-02-08 DIAGNOSIS — M1712 Unilateral primary osteoarthritis, left knee: Secondary | ICD-10-CM | POA: Diagnosis not present

## 2023-02-10 ENCOUNTER — Encounter: Payer: Self-pay | Admitting: Surgical

## 2023-02-10 MED ORDER — METHYLPREDNISOLONE ACETATE 40 MG/ML IJ SUSP
40.0000 mg | INTRAMUSCULAR | Status: AC | PRN
  Administered 2023-02-08: 40 mg via INTRA_ARTICULAR

## 2023-02-10 MED ORDER — BUPIVACAINE HCL 0.25 % IJ SOLN
4.0000 mL | INTRAMUSCULAR | Status: AC | PRN
  Administered 2023-02-08: 4 mL via INTRA_ARTICULAR

## 2023-02-10 MED ORDER — LIDOCAINE HCL 1 % IJ SOLN
5.0000 mL | INTRAMUSCULAR | Status: AC | PRN
  Administered 2023-02-08: 5 mL

## 2023-02-10 NOTE — Progress Notes (Signed)
Office Visit Note   Patient: Christina Branch           Date of Birth: Apr 06, 1941           MRN: [id number] Visit Date: 02/08/2023 Requested by: Vincente Liberty, MD [address]  [place],  [place] [address] PCP: Vincente Liberty, MD  Subjective: Chief Complaint  Patient presents with  . Left Knee - Pain    HPI: Christina Branch is a 82 y.o. female who presents to the office reporting left knee pain and swelling.  Patient has history of left knee osteoarthritis.  She has had prior injections with most recent injection into both knees on 01/24/2022 that provided excellent relief for her.  She states that she began to have pain about a week ago and noticed swelling, primarily around the top of her knee and laterally.  She has no history of recent injury.  No groin pain or radicular pain.  She has stiffness with immobility and when bending her knee.  No fevers or chills.  No waking up at night..                ROS: All systems reviewed are negative as they relate to the chief complaint within the history of present illness.  Patient denies fevers or chills.  Assessment & Plan: Visit Diagnoses:  1. Bilateral primary osteoarthritis of knee     Plan: Patient is a 82 year old female who presents for evaluation of left knee swelling.  She states that she noticed swelling about 1 to 2 weeks ago and has had increased pain since noticing it.  She has had prior knee injections with great relief.  She would like to repeat this today.  The left knee was aspirated of 45 cc of inflammatory but nonpurulent synovial fluid.  Cortisone injection successfully delivered into the knee joint.  Additionally, ultrasound probe was placed over separate area of anechoic fluid that seems to have a stalk connecting to the knee joint.  This was located lateral to the suprapatellar region.  Aspirated this area of about 12 cc of blood-tinged nonpurulent fluid.  Will see how she does and recommend she return if she has  return of pain or swelling that bothers her.  Follow-Up Instructions: No follow-ups on file.   Orders:  No orders of the defined types were placed in this encounter.  No orders of the defined types were placed in this encounter.     Procedures: Large Joint Inj: L knee on 02/08/2023 9:31 AM Indications: diagnostic evaluation, joint swelling and pain Details: 18 G 1.5 in needle, superolateral approach  Arthrogram: No  Medications: 5 mL lidocaine 1 %; 40 mg methylPREDNISolone acetate 40 MG/ML; 4 mL bupivacaine 0.25 % Aspirate: 55 mL blood-tinged Outcome: tolerated well, no immediate complications Procedure, treatment alternatives, risks and benefits explained, specific risks discussed. Consent was given by the patient. Immediately prior to procedure a time out was called to verify the correct patient, procedure, equipment, support staff and site/side marked as required. Patient was prepped and draped in the usual sterile fashion.     Clinical Data: No additional findings.  Objective: Vital Signs: There were no vitals taken for this visit.  Physical Exam:  Constitutional: Patient appears well-developed HEENT:  Head: Normocephalic Eyes:EOM are normal Neck: Normal range of motion Cardiovascular: Normal rate Pulmonary/chest: Effort normal Neurologic: Patient is alert Skin: Skin is warm Psychiatric: Patient has normal mood and affect  Ortho Exam: Ortho exam demonstrates range of motion of the  left knee from 5 degrees extension to 110 degrees of knee flexion.  She has moderately large effusion as well as separate area of swelling that is just lateral to the suprapatellar region.  Ultrasound examination demonstrates anechoic fluid in this region with thin stalk connection to the suprapatellar pouch and her knee effusion.  She has tenderness over the medial and lateral joint lines.  No pain with hip range of motion.  Able to perform straight leg raise.  No cellulitis or skin changes  noted.  There is no warmth overlying this area or the knee in general.  Specialty Comments:  No specialty comments available.  Imaging: No results found.   PMFS History: Patient Active Problem List   Diagnosis Date Noted  . Palpitations 07/04/2022  . Essential hypertension 03/19/2022  . Aortic atherosclerosis ([place]) 03/19/2022  . Coronary artery calcification 03/19/2022  . 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  . Abdominal pain   . Ventral hernia with bowel obstruction   . Bowel obstruction ([place]) 08/31/2021  . Incarcerated hernia 08/31/2021  . Emphysema lung ([place]) 08/03/2021  . Tobacco abuse 08/03/2021  . Dyspnea 08/03/2021  . Peripheral neuropathy 03/07/2020  . Primary osteoarthritis of both knees 02/02/2020  . Primary osteoarthritis of both hands 02/02/2020  . Arthropathy of lumbar facet joint 02/02/2020  . 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 DDD (degenerative disc disease), cervical 02/02/2020  . Atherosclerosis of native artery of extremity with intermittent claudication ([place]) 08/01/2012  . Pain in limb 07/04/2012  . Peripheral vascular disease, unspecified ([place]) 07/04/2012  . Intermittent claudication ([place]) 06/29/2011   Past Medical History:  Diagnosis Date  . 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  . Claudication ([place])   . Coronary artery calcification 03/19/2022  . DJD (degenerative joint disease) of knee   . Essential hypertension 03/19/2022  . GERD (gastroesophageal reflux disease)   . H/O: varicose veins    Right Leg  . Hyperlipidemia   . Hypertension   . Hypoxemia   . Myalgia and myositis, unspecified   . Pain in limb   . Palpitations 07/04/2022  . Peripheral neuropathy 03/07/2020  . Varicose veins     Family History  Problem Relation Age of Onset  . Cancer Mother   . Breast cancer Sister   . Hypertension Son     Past Surgical History:  Procedure Laterality Date  . CHOLECYSTECTOMY     Open cholecystectomy  . COLON  SURGERY     partial resection due to large polyps (Benign)  . FRACTURE SURGERY  1953   ORIF right hip  . HEMORRHOID SURGERY    . INGUINAL HERNIA REPAIR N/A 08/31/2021   Procedure: PRIMARY REPAIR INCISIONAL HERNIA; EXCISION OF ABDOMINAL SKIN TAG;  Surgeon: Erroll Luna, MD;  Location: [facility];  Service: General;  Laterality: N/A;  . LAPAROTOMY N/A 08/31/2021   Procedure: EXPLORATORY LAPAROTOMY;  Surgeon: Erroll Luna, MD;  Location: [facility];  Service: General;  Laterality: N/A;   Social History   Occupational History  . Not on file  Tobacco Use  . Smoking status: Every Day    Packs/day: 0.50    Years: 40.00    Additional pack years: 0.00    Total pack years: 20.00    Types: Cigarettes    Start date: 11/26/1962  . Smokeless tobacco: Never  . Tobacco comments:    Currently smoking 2-3 cigs occasionally as of 01/25/22  ep  Vaping Use  . Vaping Use: Never used  Substance and Sexual  Activity  . Alcohol use: No  . Drug use: No  . Sexual activity: Not on file

## 2023-03-11 ENCOUNTER — Ambulatory Visit (HOSPITAL_BASED_OUTPATIENT_CLINIC_OR_DEPARTMENT_OTHER): Payer: Medicare Other | Admitting: Family

## 2023-03-11 ENCOUNTER — Encounter (HOSPITAL_BASED_OUTPATIENT_CLINIC_OR_DEPARTMENT_OTHER): Payer: Self-pay | Admitting: Family

## 2023-03-11 VITALS — BP 140/64 | HR 61 | Ht 71.0 in | Wt 170.3 lb

## 2023-03-11 DIAGNOSIS — E785 Hyperlipidemia, unspecified: Secondary | ICD-10-CM | POA: Diagnosis not present

## 2023-03-11 DIAGNOSIS — I700 Atherosclerosis of aorta: Secondary | ICD-10-CM | POA: Diagnosis not present

## 2023-03-11 DIAGNOSIS — I25118 Atherosclerotic heart disease of native coronary artery with other forms of angina pectoris: Secondary | ICD-10-CM | POA: Diagnosis not present

## 2023-03-11 DIAGNOSIS — I10 Essential (primary) hypertension: Secondary | ICD-10-CM

## 2023-03-11 DIAGNOSIS — I739 Peripheral vascular disease, unspecified: Secondary | ICD-10-CM

## 2023-03-11 MED ORDER — DOXAZOSIN MESYLATE 2 MG PO TABS: 2.0000 mg | ORAL_TABLET | Freq: Every day | ORAL | 2 refills | Status: DC

## 2023-03-11 NOTE — Patient Instructions (Signed)
Medication Instructions:  Your physician has recommended you make the following change in your medication:   Stop: spironolactone   Start: doxazosin 2mg  daily   Follow-Up: At [facility], you and your health needs are our priority.  As part of our continuing mission to provide you with exceptional heart care, we have created designated Provider Care Teams.  These Care Teams include your primary Cardiologist (physician) and Advanced Practice Providers (APPs -  Physician Assistants and Nurse Practitioners) who all work together to provide you with the care you need, when you need it.  We recommend signing up for the patient portal called "MyChart".  Sign up information is provided on this After Visit Summary.  MyChart is used to connect with patients for Virtual Visits (Telemedicine).  Patients are able to view lab/test results, encounter notes, upcoming appointments, etc.  Non-urgent messages can be sent to your provider as well.   To learn more about what you can do with MyChart, go to [email or web address].    Your next appointment:   3 month(s)  Provider:   Chilton Si, MD or [name], NP    Other Instructions Rutherford Guys will call you  in one week to check on BP

## 2023-03-11 NOTE — Progress Notes (Signed)
Office Visit    Patient Name: Christina Branch Date of Encounter: 03/11/2023  PCP:  Corine Shelter, MD   [facility]  Cardiologist:  Chilton Si, MD  Advanced Practice Provider:  No care team member to display Electrophysiologist:  None   Chief Complaint    Christina Branch is a 82 y.o. female presents today for hypertension follow up.   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   Claudication    Coronary artery calcification 03/19/2022   DJD (degenerative joint disease) of knee    Essential hypertension 03/19/2022   GERD (gastroesophageal reflux disease)    H/O: varicose veins    Right Leg   Hyperlipidemia    Hypertension    Hypoxemia    Myalgia and myositis, unspecified    Pain in limb    Palpitations 07/04/2022   Peripheral neuropathy 03/07/2020   Varicose veins    Past Surgical History:  Procedure Laterality Date   CHOLECYSTECTOMY     Open cholecystectomy   COLON SURGERY     partial resection due to large polyps (Benign)   FRACTURE SURGERY  1953   ORIF right hip   HEMORRHOID SURGERY     INGUINAL HERNIA REPAIR N/A 08/31/2021   Procedure: PRIMARY REPAIR INCISIONAL HERNIA; EXCISION OF ABDOMINAL SKIN TAG;  Surgeon: Harriette Bouillon, MD;  Location: [facility];  Service: General;  Laterality: N/A;   LAPAROTOMY N/A 08/31/2021   Procedure: EXPLORATORY LAPAROTOMY;  Surgeon: Harriette Bouillon, MD;  Location: [facility];  Service: General;  Laterality: N/A;    Allergies  Allergies  Allergen Reactions   Penicillins Hives   Shellfish Allergy Swelling   Sulfa Antibiotics Nausea And Vomiting   [place] Gum [Fish Allergy] Swelling   History of Present Illness    Christina Branch is a 82 y.o. female with a hx of  hypertension, hyperlipidemia, emphysema, tobacco abuse, GERD, aortic valve calcification last seen 08/07/22.  She was seen in consult 03/19/2022 by Dr. Duke Salvia at the request of pulmonology.  Echo  07/2021 LVEF 70%, grade 1 diastolic dysfunction, normal pulmonary pressures.  Chest CT showed coronary and aortic atherosclerosis as well as adrenal adenoma.  She noted bilateral feet swelling with right greater than left which improved with elevation.  She was more short of breath with lying flat.  She was not exercising regularly due to arthritic pain in her knees and cervical spine.  Unable to take fluid pill due to incontinence.  Cooks predominantly at home.  Smokes 1 pack of cigarettes every 3 to 4 days.  Her BP was not well controlled and she was started on valsartan 160 mg daily.  Rosuvastatin 10 mg daily to achieve LDL goal less than 70.  At follow-up 03/2022 valsartan dose increased as blood pressure not at goal.  A follow-up 06/2022 BP remained above goal at home 140s - started on 25 mg of hydrochlorothiazide.  Isosorbide discontinued.  Rosuvastatin held due to myalgias.  Repeat LDL 108.  She called the office 07/27/2022 noting blood pressure not well controlled on hydrochlorothiazide-it was discontinued and she was started on spironolactone 25 mg daily.  Monitor 07/2022  due to palpitations with predominantly normal sinus rhythm with rare PVC/PAC and 20 episodes of SVT which were brief.  No evidence of atrial fibrillation.  She was unaware of palpitations and politely declined addition of metoprolol.  Lab work for secondary hypertension 06/2022 including TSH, renin-aldosterone, catecholamines, metanephrines  were unremarkable.   Seen 08/07/22. She was started on Chlorthalidone but had to be stopped due to decline in renal function. Carotid duplex  with bilateral 1-39% stenosis in carotid arteries and 70-99% stenosis in mesenteric arteries which was asymptomatic.   Last seen 10/201/23.  Her blood pressure was markedly elevated.  Valsartan, Imdur previously ineffective.  Did not tolerate hydralazine.  Chlorthalidone discontinued due to renal function.  BP at home was in the 140s though in clinic was 180s.   She declined additional medical therapy at that time.  Presents today for follow up independently. She has been monitoring blood pressure at home with readings routinely in the 160s by home cuff. Reports cramps in bilateral legs thought right greater than left. Will happen with activity or at night. Tells me it feels like it "knots up" and makes her worry about her balance, stability. Notes it is painful. No noted aggravating nor relieving factors. Politely declines recheck of magnesium, potassium levels. She attributes her symptoms to Spironolactone and wishes to discontinue.No chest pain, dyspnea.  Previous antihypertensives: Valsartan -  kidney decline, ineffective Hydralazine - swelling, increased urine output Chlorthalidone - kidney decline Isosorbide - ineffective Spironolactone - reported leg cramps   EKGs/Labs/Other Studies Reviewed:   The following studies were reviewed today:  CT Abdomen 08/31/21 IMPRESSION: 1. Dilated small bowel within lower abdominal ventral hernia with resultant small bowel obstruction. Findings are concerning for strangulated or incarcerated hernia. There is mesenteric edema present which can be seen with vascular compromise. No pneumatosis or free air. 2. Small amount of free fluid in the hernia and pelvis. 3. Cholecystectomy.  Stable biliary ductal dilatation. 4. Colonic diverticulosis. 5.  Aortic Atherosclerosis (ICD10-I70.0). 6. These results were called by telephone at the time of interpretation on 08/31/2021 at 5:07 pm to provider Vanetta Mulders , who verbally acknowledged these results.   Echo 08/21/21 1. Left ventricular ejection fraction, by estimation, is 70 to 75%. Left  ventricular ejection fraction by 3D volume is 72 %. The left ventricle has  hyperdynamic function. The left ventricle has no regional wall motion  abnormalities. Left ventricular  diastolic parameters are consistent with Grade I diastolic dysfunction  (impaired  relaxation).   2. Right ventricular systolic function is normal. The right ventricular  size is normal. There is normal pulmonary artery systolic pressure. The  estimated right ventricular systolic pressure is 33.9 mmHg.   3. Left atrial size was mildly dilated.   4. The mitral valve is normal in structure. No evidence of mitral valve  regurgitation. No evidence of mitral stenosis.   5. The aortic valve is calcified. Aortic valve regurgitation is not  visualized. Mild to moderate aortic valve sclerosis/calcification is  present, without any evidence of aortic stenosis.   6. The inferior vena cava is normal in size with greater than 50%  respiratory variability, suggesting right atrial pressure of 3 mmHg.     EKG: No EKG today  Recent Labs: 07/18/2022: ALT 12; Hemoglobin 13.6; Magnesium 2.0; Platelets 340; TSH 0.872 09/18/2022: BUN 20; Creatinine, Ser 1.01; Potassium 4.3; Sodium 143  Recent Lipid Panel    Component Value Date/Time   CHOL 191 06/21/2022 1621   TRIG 116 06/21/2022 1621   HDL 63 06/21/2022 1621   CHOLHDL 3.0 06/21/2022 1621   LDLCALC 108 (H) 06/21/2022 1621   Home Medications   Current Meds  Medication Sig   amLODipine (NORVASC) 10 MG tablet Take 10 mg by mouth daily.    diclofenac Sodium (VOLTAREN) 1 %  GEL Apply 2 g topically daily as needed (pain).   doxazosin (CARDURA) 2 MG tablet Take 1 tablet (2 mg total) by mouth daily.   HYDROcodone-ibuprofen (VICOPROFEN) 7.5-200 MG per tablet Take 1 tablet by mouth every 6 (six) hours as needed for moderate pain.    loratadine (CLARITIN) 10 MG tablet Take 10 mg by mouth daily as needed for allergies.   Multiple Vitamins-Minerals (CENTRUM SILVER PO) Take 1 tablet by mouth daily.    Vitamin D, Ergocalciferol, (DRISDOL) 50000 units CAPS capsule Take 50,000 Units by mouth every 7 (seven) days.   [DISCONTINUED] spironolactone (ALDACTONE) 25 MG tablet Take 1 tablet (25 mg total) by mouth daily.    Review of Systems      All  other systems reviewed and are otherwise negative except as noted above.  Physical Exam    VS:  BP (!) 140/64 (BP Location: Left Arm, Patient Position: Sitting, Cuff Size: Normal)   Pulse 61   Ht  (1.803 m)   Wt 170 lb 4.8 oz (77.2 kg)   BMI 23.75 kg/m  , BMI Body mass index is 23.75 kg/m.  Wt Readings from Last 3 Encounters:  03/11/23 170 lb 4.8 oz (77.2 kg)  09/25/22 172 lb 4.8 oz (78.2 kg)  08/07/22 174 lb 8 oz (79.2 kg)    GEN: Well nourished, well developed, in no acute distress. HEENT: normal. Neck: Supple, no JVD, carotid bruits, or masses. Cardiac: RRR, no  rubs, or gallops. Gr 2/6 systolic murmur. No clubbing, cyanosis, edema.  Radials/PT 2+ and equal bilaterally.  Respiratory:  Respirations regular and unlabored, clear to auscultation bilaterally. GI: Soft, nontender, nondistended. MS: No deformity or atrophy. Skin: Warm and dry, no rash. Neuro:  Strength and sensation are intact. Psych: Normal affect.  Assessment & Plan    CAD /aortic atherosclerosis /HLD-noted by CT scan.  Predominantly LAD calcification.  Intolerant to rosuvastatin, pravastatin.  Previously declined Zetia or referral to lipid clinic.  Prefers to manage with lifestyle changes.  We discussed that GDMT for coronary artery disease, carotid and mesenteric artery stenosis includes lipid-lowering therapy though she again declines.  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  Gradients normal by echo 07/2021.  Slight murmur stable on exam.  No indication for repeat echo at this time.  HTN -BP goal less than 130/80.  Previous intolerance to valsartan, hydralazine, chlorthalidone, isosorbide. Today she attributes leg cramping to Spironolactone (she she has been taking for 6 months and was tolerating at previous visit). Per her request, will discontinue Spironolactone. Rx Doxazosin  QD. Discussed at length that would likely need to up-titrate. I 06/2022 TSH, metanephrines,  catecholamines unremarkable. 08/2022 no renal artery stenosis.  Carotid and mesenteric artery stenosis - Duplex 08/05/2022 bilateral 1-39% stenosis in carotid arteries and 70-99% stenosis of mesenteric artery. Repeat duplex in one year already ordered. Lipid management as above.   GERD -continue omeprazole and follow-up with primary care provider.  Tobacco abuse - Smoking cessation encouraged. Recommend utilization of [id number].   Disposition: Follow up in 3 month(s)  with Chilton Si, MD or APP.  Signed, Alver Sorrow, NP 03/11/2023, 4:45 PM [facility]

## 2023-03-18 ENCOUNTER — Telehealth (HOSPITAL_BASED_OUTPATIENT_CLINIC_OR_DEPARTMENT_OTHER): Payer: Self-pay

## 2023-03-18 NOTE — Telephone Encounter (Addendum)
Called patient to follow up on blood pressures since last office visit, no answer, left message to call back.   ----- Message from Marlene Lard, RN sent at 03/11/2023  3:35 PM EDT ----- Call to check on BP

## 2023-03-19 NOTE — Telephone Encounter (Signed)
2nd call attempt to patient, no answer, left message.

## 2023-03-21 NOTE — Telephone Encounter (Signed)
Returned call to patient, reviewed the following recommendations with the patient. When asked if she would be interested in trialing a higher dose of her medication she simply states "No," reminded patient of her blood pressure goal of being less than 130/80 she states she doesn't feel good at that low of a pressure and does not wish to make any changes at this time.     "BP not at goal <130/80. Recommend increase Doxazosin to  QHS. As discussed in clinic, we started a low dose and anticipated need to up-titrate. Glad her cramping is better. If possible, check BP at least an hour after medications.    Alver Sorrow, NP"

## 2023-03-21 NOTE — Telephone Encounter (Signed)
Patient returned call and asked to speak again. She said she didn't mean to give the impression that she wouldn't consider taking more medications. She wanted to let us know that somehow she twitched her body in the left side of her chest that is radiating. She said that it was constant but it is getting better with pain medications. She is seeing PCP today for the pain. She will call us back in a week or so to see how the pressures are doing.

## 2023-03-21 NOTE — Telephone Encounter (Signed)
Will address at follow up visit.   Alver Sorrow, NP

## 2023-03-21 NOTE — Telephone Encounter (Signed)
BP not at goal <130/80. Recommend increase Doxazosin to  QHS. As discussed in clinic, we started a low dose and anticipated need to up-titrate. Glad her cramping is better. If possible, check BP at least an hour after medications.   Alver Sorrow, NP

## 2023-03-21 NOTE — Telephone Encounter (Signed)
3rd call attempt to patient,   Patient provided the following log,   4/18 127/68 145/74- felt like she was flushed and damp but wasn't  4/22 144/56 156/71  153/72 154/70  4/25 152/64 148/70   Has been waking up with a dull headache, but the cramping in her legs is better. She states some days she takes it before her medications and some days she takes it after.

## 2023-05-22 ENCOUNTER — Ambulatory Visit: Payer: Medicare Other | Admitting: Orthopedic Surgery

## 2023-05-22 ENCOUNTER — Other Ambulatory Visit: Payer: Self-pay

## 2023-05-22 ENCOUNTER — Other Ambulatory Visit (INDEPENDENT_AMBULATORY_CARE_PROVIDER_SITE_OTHER): Payer: Medicare Other

## 2023-05-22 ENCOUNTER — Encounter: Payer: Self-pay | Admitting: Orthopedic Surgery

## 2023-05-22 DIAGNOSIS — M1712 Unilateral primary osteoarthritis, left knee: Secondary | ICD-10-CM | POA: Diagnosis not present

## 2023-05-22 DIAGNOSIS — M170 Bilateral primary osteoarthritis of knee: Secondary | ICD-10-CM | POA: Diagnosis not present

## 2023-05-22 DIAGNOSIS — M1711 Unilateral primary osteoarthritis, right knee: Secondary | ICD-10-CM | POA: Diagnosis not present

## 2023-05-22 NOTE — Progress Notes (Unsigned)
Office Visit Note   Patient: Christina Branch           Date of Birth: [birth year]           MRN: [id number] Visit Date: 05/22/2023 Requested by: Corine Shelter, MD [address] [place],  [place] [address] PCP: Corine Shelter, MD  Subjective: Chief Complaint  Patient presents with   Right Knee - Pain   Left Knee - Pain    HPI: Christina Branch is a 82 y.o. female who presents to the office reporting bilateral knee pain right worse than left.  The pain has increased.  Did have injection of cortisone in the left knee on 02/08/2023 which did not help her as much as she has had in the past.  Does have known severe bilateral knee arthritis little worse on the left radiographically than the right.  No interval history of injury or trauma.  Pain does not wake her from sleep at night.  She does report some weakness and giving way..                ROS: All systems reviewed are negative as they relate to the chief complaint within the history of present illness.  Patient denies fevers or chills.  Assessment & Plan: Visit Diagnoses:  1. Bilateral primary osteoarthritis of knee     Plan: Impression is bilateral knee arthritis with right knee worse than left.  Bilateral knee aspiration and injections performed today.  Could consider gel injections as a next step but typically cortisone has given her many months if not a year of relief.  Follow-Up Instructions: No follow-ups on file.   Orders:  Orders Placed This Encounter  Procedures   XR KNEE 3 VIEW RIGHT   XR Knee 1-2 Views Left   No orders of the defined types were placed in this encounter.     Procedures: Large Joint Inj: bilateral knee on 05/22/2023 2:51 PM Indications: diagnostic evaluation, joint swelling and pain Details: 18 G 1.5 in needle, superolateral approach  Arthrogram: No  Medications (Right): 5 mL lidocaine 1 %; 4 mL bupivacaine 0.25 %; 40 mg methylPREDNISolone acetate 40 MG/ML Medications (Left): 5 mL  lidocaine 1 %; 4 mL bupivacaine 0.25 %; 40 mg methylPREDNISolone acetate 40 MG/ML Outcome: tolerated well, no immediate complications Procedure, treatment alternatives, risks and benefits explained, specific risks discussed. Consent was given by the patient. Immediately prior to procedure a time out was called to verify the correct patient, procedure, equipment, support staff and site/side marked as required. Patient was prepped and draped in the usual sterile fashion.       Clinical Data: No additional findings.  Objective: Vital Signs: There were no vitals taken for this visit.  Physical Exam:  Constitutional: Patient appears well-developed HEENT:  Head: Normocephalic Eyes:EOM are normal Neck: Normal range of motion Cardiovascular: Normal rate Pulmonary/chest: Effort normal Neurologic: Patient is alert Skin: Skin is warm Psychiatric: Patient has normal mood and affect  Ortho Exam: Ortho exam demonstrates mild effusion left knee no effusion right knee.  Extensor mechanism intact.  Has about 5 degree flexion contractures bilaterally but can bend slightly past 90 with both knees.  No groin pain with internal/external rotation of the leg.  Both feet are warm and perfused.  No masses lymphadenopathy or skin changes noted in that knee region.  Specialty Comments:  No specialty comments available.  Imaging: XR KNEE 3 VIEW RIGHT  Result Date: 05/22/2023 AP lateral merchant radiographs right knee reviewed.  Severe end-stage tricompartmental arthritis is present in all 3 compartments.  Alignment intact.  No acute fracture.    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   Abdominal pain    Ventral hernia with bowel obstruction    Bowel obstruction (HCC) 08/31/2021    Incarcerated hernia 08/31/2021   Emphysema lung (HCC) 08/03/2021   Tobacco abuse 08/03/2021   Dyspnea 08/03/2021   Peripheral neuropathy 03/07/2020   Primary osteoarthritis of both knees 02/02/2020   Primary osteoarthritis of both hands 02/02/2020   Arthropathy of lumbar facet joint 02/02/2020   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, 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/2020   DDD (degenerative disc disease), cervical 02/02/2020   Atherosclerosis of native artery of extremity with intermittent claudication (HCC) 08/01/2012   Pain in limb 07/04/2012   Peripheral vascular disease, unspecified (HCC) 07/04/2012   Intermittent claudication (HCC) 06/29/2011   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   Claudication (HCC)    Coronary artery calcification 03/19/2022   DJD (degenerative joint disease) of knee    Essential hypertension 03/19/2022   GERD (gastroesophageal reflux disease)    H/O: varicose veins    Right Leg   Hyperlipidemia    Hypertension    Hypoxemia    Myalgia and myositis, unspecified    Pain in limb    Palpitations 07/04/2022   Peripheral neuropathy 03/07/2020   Varicose veins     Family History  Problem Relation Age of Onset   Cancer Mother    Breast cancer Sister    Hypertension Son     Past Surgical History:  Procedure Laterality Date   CHOLECYSTECTOMY     Open cholecystectomy   COLON SURGERY     partial resection due to large polyps (Benign)   FRACTURE SURGERY  1953   ORIF right hip   HEMORRHOID SURGERY     INGUINAL HERNIA REPAIR N/A 08/31/2021   Procedure: PRIMARY REPAIR INCISIONAL HERNIA; EXCISION OF ABDOMINAL SKIN TAG;  Surgeon: Harriette Bouillon, MD;  Location: [facility];  Service: General;  Laterality: N/A;   LAPAROTOMY N/A 08/31/2021   Procedure: EXPLORATORY LAPAROTOMY;  Surgeon: Harriette Bouillon, MD;  Location: [facility];  Service: General;  Laterality: N/A;   Social History   Occupational History   Not on file  Tobacco Use    Smoking status: Every Day    Packs/day: 0.50    Years: 40.00    Additional pack years: 0.00    Total pack years: 20.00    Types: Cigarettes    Start date: 11/26/1962   Smokeless tobacco: Never   Tobacco comments:    Currently smoking 2-3 cigs occasionally as of 01/25/22  ep  Vaping Use   Vaping Use: Never used  Substance and Sexual Activity   Alcohol use: No   Drug use: No   Sexual activity: Not on file

## 2023-05-23 MED ORDER — LIDOCAINE HCL 1 % IJ SOLN
5.0000 mL | INTRAMUSCULAR | Status: AC | PRN
  Administered 2023-05-22: 5 mL

## 2023-05-23 MED ORDER — METHYLPREDNISOLONE ACETATE 40 MG/ML IJ SUSP
40.0000 mg | INTRAMUSCULAR | Status: AC | PRN
Start: 2023-05-22 — End: 2023-05-22
  Administered 2023-05-22: 40 mg via INTRA_ARTICULAR

## 2023-05-23 MED ORDER — METHYLPREDNISOLONE ACETATE 40 MG/ML IJ SUSP
40.0000 mg | INTRAMUSCULAR | Status: AC | PRN
  Administered 2023-05-22: 40 mg via INTRA_ARTICULAR

## 2023-05-23 MED ORDER — BUPIVACAINE HCL 0.25 % IJ SOLN
4.0000 mL | INTRAMUSCULAR | Status: AC | PRN
  Administered 2023-05-22: 4 mL via INTRA_ARTICULAR

## 2023-06-13 ENCOUNTER — Encounter (HOSPITAL_BASED_OUTPATIENT_CLINIC_OR_DEPARTMENT_OTHER): Payer: Self-pay | Admitting: Family

## 2023-06-13 ENCOUNTER — Ambulatory Visit (HOSPITAL_BASED_OUTPATIENT_CLINIC_OR_DEPARTMENT_OTHER): Payer: Medicare Other | Admitting: Family

## 2023-06-13 VITALS — BP 140/62 | HR 57 | Ht 71.0 in | Wt 171.9 lb

## 2023-06-13 DIAGNOSIS — R5383 Other fatigue: Secondary | ICD-10-CM | POA: Diagnosis not present

## 2023-06-13 DIAGNOSIS — I6523 Occlusion and stenosis of bilateral carotid arteries: Secondary | ICD-10-CM

## 2023-06-13 DIAGNOSIS — I10 Essential (primary) hypertension: Secondary | ICD-10-CM | POA: Diagnosis not present

## 2023-06-13 DIAGNOSIS — I2584 Coronary atherosclerosis due to calcified coronary lesion: Secondary | ICD-10-CM

## 2023-06-13 DIAGNOSIS — I2510 Atherosclerotic heart disease of native coronary artery without angina pectoris: Secondary | ICD-10-CM

## 2023-06-13 DIAGNOSIS — Z720 Tobacco use: Secondary | ICD-10-CM

## 2023-06-13 DIAGNOSIS — E785 Hyperlipidemia, unspecified: Secondary | ICD-10-CM

## 2023-06-13 DIAGNOSIS — I358 Other nonrheumatic aortic valve disorders: Secondary | ICD-10-CM | POA: Diagnosis not present

## 2023-06-13 MED ORDER — DOXAZOSIN MESYLATE 2 MG PO TABS
2.0000 mg | ORAL_TABLET | Freq: Every day | ORAL | 1 refills | Status: DC
Start: 2023-06-13 — End: 2023-12-17

## 2023-06-13 NOTE — Patient Instructions (Signed)
Medication Instructions:  Your physician recommends that you continue on your current medications as directed. Please refer to the Current Medication list given to you today.  We have refilled your Doxazosin  *If you need a refill on your cardiac medications before your next appointment, please call your pharmacy*   Lab Work: Your physician recommends that you return for lab work today- BMP, thyroid panel   If you have labs (blood work) drawn today and your tests are completely normal, you will receive your results only by: MyChart Message (if you have MyChart) OR A paper copy in the mail If you have any lab test that is abnormal or we need to change your treatment, we will call you to review the results.  Follow-Up: At [facility], you and your health needs are our priority.  As part of our continuing mission to provide you with exceptional heart care, we have created designated Provider Care Teams.  These Care Teams include your primary Cardiologist (physician) and Advanced Practice Providers (APPs -  Physician Assistants and Nurse Practitioners) who all work together to provide you with the care you need, when you need it.  We recommend signing up for the patient portal called "MyChart".  Sign up information is provided on this After Visit Summary.  MyChart is used to connect with patients for Virtual Visits (Telemedicine).  Patients are able to view lab/test results, encounter notes, upcoming appointments, etc.  Non-urgent messages can be sent to your provider as well.   To learn more about what you can do with MyChart, go to [email or web address].    Your next appointment:   Follow up as scheduled

## 2023-06-13 NOTE — Progress Notes (Signed)
Cardiology Office Note:  .   Date:  06/13/2023  ID:  Christina Branch, DOB 1941-03-08, MRN [id number] PCP: Corine Shelter, MD  [facility] Providers Cardiologist:  Chilton Si, MD    History of Present Illness: .   Christina Branch is a 82 y.o. female with a hx of  hypertension, hyperlipidemia, emphysema, tobacco abuse, GERD, aortic valve calcification last seen 03/11/23   She was seen in consult 03/19/2022 by Dr. Duke Salvia at the request of pulmonology.  Echo 07/2021 LVEF 70%, grade 1 diastolic dysfunction, normal pulmonary pressures.  Chest CT showed coronary and aortic atherosclerosis as well as adrenal adenoma.  She noted bilateral feet swelling with right greater than left which improved with elevation. Unable to take fluid pill due to incontinence. Smokes 1 pack of cigarettes every 3 to 4 days.  Her BP was not well controlled and she was started on valsartan 160 mg daily.  Rosuvastatin 10 mg daily to achieve LDL goal less than 70.   At follow-up 03/2022 valsartan dose increased as blood pressure not at goal.  A follow-up 06/2022 BP remained above goal at home 140s - started on 25 mg of hydrochlorothiazide.  Isosorbide discontinued.  Rosuvastatin held due to myalgias.  She called the office 07/27/2022 noting blood pressure not well controlled on hydrochlorothiazide-it was discontinued and she was started on spironolactone 25 mg daily.   Monitor 07/2022  due to palpitations with predominantly normal sinus rhythm with rare PVC/PAC and 20 episodes of SVT which were brief.  No evidence of atrial fibrillation.  She was unaware of palpitations and politely declined addition of metoprolol.   Lab work for secondary hypertension 06/2022 including TSH, renin-aldosterone, catecholamines, metanephrines were unremarkable.    Seen 08/07/22. She was started on Chlorthalidone but had to be stopped due to decline in renal function. Carotid duplex  with bilateral 1-39% stenosis in carotid arteries and 70-99%  stenosis in mesenteric arteries which was asymptomatic.    At visit 08/2022 BP again markedly elevated.  Valsartan, Imdur previously ineffective.  Did not tolerate hydralazine.  Chlorthalidone discontinued due to renal function.  BP at home was in the 140s though in clinic was 180s.  She declined additional medical therapy at that time.  Last seen 03/11/23 Noted leg cramping on Spironolactone (despite being on it for 6 months) so it was stopped and Doxazosin 2mg  at bedtime initiated.   She presents today for follow up independently. She has a myriad of concerns. She notes no energy, no appetite, has days "I don't feel like getting out of bed", nauseous, sensation of something crawling on her skin. Reports low energy level has been ongoing since April and has been consistent. Wakes feeling tired. Reports trouble falling asleep and then waking to use the restroom 3-4 times per night. Has follow up with PCP Monday.   Took her last tablet of Doxazosin today. She reports her blood pressure is still labile despite checking at the same time each day with readings 130s-160s. Today at noon her blood pressure was 158/65 and in her left arm 143/60.   She has had a couple instances of swelling. Usually by end the day if she has been on her feet a lot.   She is still noting bad cramps in her right leg. She tells me it "twists" her right leg out of shape. It has not improved since stopping Spironolactone  Previous antihypertensives: Valsartan -  kidney decline, ineffective Hydralazine - swelling, increased urine output Chlorthalidone - kidney  decline Isosorbide - ineffective Spironolactone - reported leg cramps  ROS: Please see the history of present illness.    All other systems reviewed and are negative.   Studies Reviewed: .        Cardiac Studies & Procedures       ECHOCARDIOGRAM  ECHOCARDIOGRAM COMPLETE 08/21/2021  Narrative ECHOCARDIOGRAM REPORT    Patient Name:   Christina Branch Date of  Exam: 08/21/2021 Medical Rec #:  [id number]      Height:       70.0 in Accession #:    [id number]     Weight:       179.6 lb Date of Birth:  1941-09-15       BSA:          1.994 m Patient Age:    80 years       BP:           148/70 mmHg Patient Gender: F              HR:           55 bpm. Exam Location:  [address]  Procedure: 2D Echo, 3D Echo, Cardiac Doppler and Color Doppler  Indications:    R06.00 Dyspnea  History:        Patient has no prior history of Echocardiogram examinations. Signs/Symptoms:Edema; Risk Factors:Current Smoker, Hypertension and Dyslipidemia.  Sonographer:    Farrel Conners RDCS Referring Phys: Kalman Shan  IMPRESSIONS   1. Left ventricular ejection fraction, by estimation, is 70 to 75%. Left ventricular ejection fraction by 3D volume is 72 %. The left ventricle has hyperdynamic function. The left ventricle has no regional wall motion abnormalities. Left ventricular diastolic parameters are consistent with Grade I diastolic dysfunction (impaired relaxation). 2. Right ventricular systolic function is normal. The right ventricular size is normal. There is normal pulmonary artery systolic pressure. The estimated right ventricular systolic pressure is 33.9 mmHg. 3. Left atrial size was mildly dilated. 4. The mitral valve is normal in structure. No evidence of mitral valve regurgitation. No evidence of mitral stenosis. 5. The aortic valve is calcified. Aortic valve regurgitation is not visualized. Mild to moderate aortic valve sclerosis/calcification is present, without any evidence of aortic stenosis. 6. The inferior vena cava is normal in size with greater than 50% respiratory variability, suggesting right atrial pressure of 3 mmHg.  FINDINGS Left Ventricle: Left ventricular ejection fraction, by estimation, is 70 to 75%. Left ventricular ejection fraction by 3D volume is 72 %. The left ventricle has hyperdynamic function. The left ventricle has no regional  wall motion abnormalities. The left ventricular internal cavity size was normal in size. There is no left ventricular hypertrophy. Left ventricular diastolic parameters are consistent with Grade I diastolic dysfunction (impaired relaxation). Normal left ventricular filling pressure.  Right Ventricle: The right ventricular size is normal. No increase in right ventricular wall thickness. Right ventricular systolic function is normal. There is normal pulmonary artery systolic pressure. The tricuspid regurgitant velocity is 2.78 m/s, and with an assumed right atrial pressure of 3 mmHg, the estimated right ventricular systolic pressure is 33.9 mmHg.  Left Atrium: Left atrial size was mildly dilated.  Right Atrium: Right atrial size was normal in size.  Pericardium: There is no evidence of pericardial effusion.  Mitral Valve: The mitral valve is normal in structure. Mild mitral annular calcification. No evidence of mitral valve regurgitation. No evidence of mitral valve stenosis.  Tricuspid Valve: The tricuspid valve is normal in structure. Tricuspid valve  regurgitation is trivial. No evidence of tricuspid stenosis.  Aortic Valve: The aortic valve is calcified. Aortic valve regurgitation is not visualized. Mild to moderate aortic valve sclerosis/calcification is present, without any evidence of aortic stenosis.  Pulmonic Valve: The pulmonic valve was normal in structure. Pulmonic valve regurgitation is not visualized. No evidence of pulmonic stenosis.  Aorta: The aortic root is normal in size and structure.  Venous: The inferior vena cava is normal in size with greater than 50% respiratory variability, suggesting right atrial pressure of 3 mmHg.  IAS/Shunts: No atrial level shunt detected by color flow Doppler.   LEFT VENTRICLE PLAX 2D LVIDd:         3.80 cm         Diastology LVIDs:         1.80 cm         LV e' medial:    6.53 cm/s LV PW:         0.80 cm         LV E/e' medial:  11.6 LV  IVS:        0.90 cm         LV e' lateral:   13.70 cm/s LVOT diam:     2.20 cm         LV E/e' lateral: 5.5 LV SV:         92 LV SV Index:   46 LVOT Area:     3.80 cm        3D Volume EF LV 3D EF:    Left ventricul ar ejection fraction by 3D volume is 72 %.  3D Volume EF: 3D EF:        72 % LV EDV:       151 ml LV ESV:       42 ml LV SV:        110 ml  RIGHT VENTRICLE RV S prime:     13.20 cm/s TAPSE (M-mode): 1.9 cm  LEFT ATRIUM             Index       RIGHT ATRIUM           Index LA diam:        3.20 cm 1.60 cm/m  RA Area:     19.60 cm LA Vol (A2C):   65.8 ml 33.00 ml/m RA Volume:   54.00 ml  27.08 ml/m LA Vol (A4C):   68.4 ml 34.30 ml/m LA Biplane Vol: 70.2 ml 35.21 ml/m AORTIC VALVE LVOT Vmax:   99.70 cm/s LVOT Vmean:  68.600 cm/s LVOT VTI:    0.243 m  AORTA Ao Root diam: 3.10 cm Ao Asc diam:  3.45 cm  MITRAL VALVE                TRICUSPID VALVE MV Area (PHT): cm          TR Peak grad:   30.9 mmHg MV Decel Time: 280 msec     TR Vmax:        278.00 cm/s MV E velocity: 75.65 cm/s MV A velocity: 110.50 cm/s  SHUNTS MV E/A ratio:  0.68         Systemic VTI:  0.24 m Systemic Diam: 2.20 cm  Armanda Magic MD Electronically signed by Armanda Magic MD Signature Date/Time: 08/21/2021/3:50:22 PM    Final    MONITORS  LONG TERM MONITOR (3-14 DAYS) 07/31/2022  Narrative 14 Day Zio Monitor  Quality: Fair.  Baseline artifact.  Predominant rhythm: sinus rhythm Average heart rate: 60 bpm Max heart rate: 121 bpm Min heart rate: 40 bpm Pauses >2.5 seconds: none  Occasional PACs 1.5% Occasional PVCs 1.8% Rare ventricular bigeminy  20 episodes of SVT up to 13 beats.  Maximum rate 190 bpm   Tiffany C. Duke Salvia, MD, [facility] 08/19/2022 2:53 PM           Risk Assessment/Calculations:     HYPERTENSION CONTROL Vitals:   06/13/23 1459 06/13/23 1523  BP: (!) 154/65 (!) 140/62    The patient's blood pressure is elevated above target today.  In order to  address the patient's elevated BP: Follow up with general cardiology has been recommended.; Blood pressure will be monitored at home to determine if medication changes need to be made. (Miss Solinger declines additional medication changes today)          Physical Exam:   VS:  BP (!) 140/62   Pulse (!) 57   Ht 5\' 11"  (1.803 m)   Wt 171 lb 14.4 oz (78 kg)   SpO2 97%   BMI 23.98 kg/m    Wt Readings from Last 3 Encounters:  06/13/23 171 lb 14.4 oz (78 kg)  03/11/23 170 lb 4.8 oz (77.2 kg)  09/25/22 172 lb 4.8 oz (78.2 kg)    GEN: Well nourished, well developed in no acute distress NECK: No JVD; No carotid bruits CARDIAC: RRR, no rubs, gallops. Gr 2/6 murmur. RESPIRATORY:  Clear to auscultation without rales, wheezing or rhonchi  ABDOMEN: Soft, non-tender, non-distended EXTREMITIES:  No edema; No deformity   ASSESSMENT AND PLAN: .    Nocturia / Leg cramps / Fatigue / Nausea - Myriad of concerns today. Encouraged to discuss with her PCP next week. Leg cramps not improved since discontinuation of Spironolactone, suspect it was not contributory. Nocturia not improved and not on diuretic - may benefit from Oxybutin but defer to PCP. She notes 3 month history of fatigue, inactivity in doing things. Some concern for clinical depression. Will update thyroid panel to rule out hypothyroidism. Denies melena, hematuria concerning for anemia.   CAD /aortic atherosclerosis /HLD-noted by CT scan.  Predominantly LAD calcification. Stable with no anginal symptoms. No indication for ischemic evaluation.    Intolerant to rosuvastatin, pravastatin.  Continues to decline Zetia or referral to lipid clinic.  Prefers to manage with lifestyle changes.    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  Gradients normal by echo 07/2021.  Slight murmur stable on exam.  No indication for repeat echo at this time.   HTN -BP goal less than 130/80.  Previous intolerance to valsartan, hydralazine,  chlorthalidone, isosorbide, spironolactone.  Initial BP 1 5465 with repeat 140/62 without intervention.  We discussed increasing her doxazosin from 2 mg to 4 mg but she declines as she is worried about her BP dropping too low despite reassurance. Refill of Doxazosin 2mg  provided. Update BMP for monitoring of renal function per her request.  06/2022 TSH, metanephrines, catecholamines unremarkable. 08/2022 no renal artery stenosis. 08/2022 bilateral 1-39% stenosis   Carotid and mesenteric artery stenosis - Duplex 08/05/2022 bilateral 1-39% stenosis in carotid arteries and 70-99% stenosis of mesenteric artery. Repeat duplex in one year already ordered. Lipid management as above.    GERD -continue omeprazole and follow-up with primary care provider.   Tobacco abuse - Smoking cessation encouraged. Not interested in quitting at this time. Recommend utilization of [id number].         Dispo: follow up in 2  months  Signed, Alver Sorrow, NP

## 2023-06-14 ENCOUNTER — Telehealth (HOSPITAL_BASED_OUTPATIENT_CLINIC_OR_DEPARTMENT_OTHER): Payer: Self-pay

## 2023-06-14 LAB — BASIC METABOLIC PANEL
BUN/Creatinine Ratio: 12 (ref 12–28)
BUN: 13 mg/dL (ref 8–27)
CO2: 22 mmol/L (ref 20–29)
Calcium: 10.5 mg/dL — ABNORMAL HIGH (ref 8.7–10.3)
Chloride: 106 mmol/L (ref 96–106)
Creatinine, Ser: 1.08 mg/dL — ABNORMAL HIGH (ref 0.57–1.00)
Glucose: 101 mg/dL — ABNORMAL HIGH (ref 70–99)
Potassium: 4.9 mmol/L (ref 3.5–5.2)
Sodium: 141 mmol/L (ref 134–144)
eGFR: 51 mL/min/{1.73_m2} — ABNORMAL LOW (ref >59)

## 2023-06-14 LAB — THYROID PANEL WITH TSH
Free Thyroxine Index: 2 (ref 1.2–4.9)
T3 Uptake Ratio: 26 % (ref 24–39)
T4, Total: 7.7 ug/dL (ref 4.5–12.0)
TSH: 1.27 u[IU]/mL (ref 0.450–4.500)

## 2023-06-14 NOTE — Telephone Encounter (Addendum)
Called results to patient and left results on VM (ok per DPR), instructions left to call office back if patient has any questions!    ----- Message from Alver Sorrow sent at 06/14/2023 10:51 AM EDT ----- Stable kidney function compared to previous. No evidence of worsening kidney disease. Good result!

## 2023-06-17 ENCOUNTER — Telehealth (HOSPITAL_BASED_OUTPATIENT_CLINIC_OR_DEPARTMENT_OTHER): Payer: Self-pay | Admitting: Family

## 2023-06-17 NOTE — Telephone Encounter (Signed)
Sent labs as requested, PCP same as listed in chart

## 2023-06-17 NOTE — Telephone Encounter (Signed)
Tyler with the patient's PCP, Dr. Consuello Bossier office calling to request the patient's recent lab work be faxed to them. Fax: [phone] Phone: [phone]

## 2023-08-02 ENCOUNTER — Telehealth: Payer: Self-pay | Admitting: Physical Medicine and Rehabilitation

## 2023-08-02 NOTE — Telephone Encounter (Signed)
Pt requesting right hip injection, please advise

## 2023-08-05 ENCOUNTER — Other Ambulatory Visit: Payer: Self-pay | Admitting: Physical Medicine and Rehabilitation

## 2023-08-05 DIAGNOSIS — M25551 Pain in right hip: Secondary | ICD-10-CM

## 2023-08-12 ENCOUNTER — Other Ambulatory Visit: Payer: Self-pay

## 2023-08-12 ENCOUNTER — Ambulatory Visit: Payer: Medicare Other | Admitting: Physical Medicine and Rehabilitation

## 2023-08-12 DIAGNOSIS — M25551 Pain in right hip: Secondary | ICD-10-CM

## 2023-08-12 NOTE — Progress Notes (Unsigned)
Functional Pain Scale - descriptive words and definitions  Intense (8)    Cannot complete any ADLs without much assistance/cannot concentrate/conversation is difficult/unable to sleep and unable to use distraction. Severe range order  Average Pain 8  When she first get up and going he is having a lot of paint today and is ready for the injection.   +Driver, -BT, -Dye Allergies.

## 2023-08-12 NOTE — Progress Notes (Unsigned)
Christina Branch - 82 y.o. female MRN [id number]  Date of birth: 11-06-1941  Office Visit Note: Visit Date: 08/12/2023 PCP: Corine Shelter, MD Referred by: Corine Shelter, MD  Subjective: Chief Complaint  Patient presents with   Right Hip - Pain   HPI:  Christina Branch is a 82 y.o. female who comes in todayHPI ROS Otherwise per HPI.  Assessment & Plan: Visit Diagnoses: No diagnosis found.  Plan: No additional findings.   Meds & Orders: No orders of the defined types were placed in this encounter.  No orders of the defined types were placed in this encounter.   Follow-up: No follow-ups on file.   Procedures: Large Joint Inj: R hip joint on 08/12/2023 10:19 AM Indications: diagnostic evaluation and pain Details: 22 G 3.5 in needle, fluoroscopy-guided anterior approach  Arthrogram: No  Medications: 4 mL bupivacaine 0.25 %; 40 mg triamcinolone acetonide 40 MG/ML Outcome: tolerated well, no immediate complications  There was excellent flow of contrast producing a partial arthrogram of the hip. The patient did have relief of symptoms during the anesthetic phase of the injection. Procedure, treatment alternatives, risks and benefits explained, specific risks discussed. Consent was given by the patient. Immediately prior to procedure a time out was called to verify the correct patient, procedure, equipment, support staff and site/side marked as required. Patient was prepped and draped in the usual sterile fashion.          Clinical History: No specialty comments available.     Objective:  VS:  HT:    WT:   BMI:     BP:   HR: bpm  TEMP: ( )  RESP:  Physical Exam   Imaging: No results found.

## 2023-08-14 MED ORDER — TRIAMCINOLONE ACETONIDE 40 MG/ML IJ SUSP
40.0000 mg | INTRAMUSCULAR | Status: AC | PRN
Start: 2023-08-12 — End: 2023-08-12
  Administered 2023-08-12: 40 mg via INTRA_ARTICULAR

## 2023-08-14 MED ORDER — BUPIVACAINE HCL 0.25 % IJ SOLN
4.0000 mL | INTRAMUSCULAR | Status: AC | PRN
Start: 2023-08-12 — End: 2023-08-12
  Administered 2023-08-12: 4 mL via INTRA_ARTICULAR

## 2023-08-16 ENCOUNTER — Other Ambulatory Visit: Payer: Self-pay | Admitting: Physical Medicine and Rehabilitation

## 2023-08-16 ENCOUNTER — Telehealth: Payer: Self-pay | Admitting: Physical Medicine and Rehabilitation

## 2023-08-16 NOTE — Telephone Encounter (Signed)
Patient called advised the pain is still very painful. Patient said there is some swelling where she had the injection. Patient said when she stood up the pain was sharpe.  Patient asked for a call back as soon as possible. The number to contact patient is [phone]

## 2023-08-22 ENCOUNTER — Ambulatory Visit (HOSPITAL_BASED_OUTPATIENT_CLINIC_OR_DEPARTMENT_OTHER): Payer: Medicare Other | Admitting: Cardiovascular Disease

## 2023-08-22 NOTE — Progress Notes (Deleted)
Cardiology Office Note:  .   Date:  08/22/2023  ID:  Christina Branch, DOB 03/22/1941, MRN [id number] PCP: Corine Shelter, MD  [facility] Providers Cardiologist:  Chilton Si, MD { Click to update primary MD,subspecialty MD or APP then REFRESH:1}   History of Present Illness: .   Christina Branch is a 82 y.o. female with a hx of hypertension, hyperlipidemia, emphysema, tobacco abuse, and GERD here today for follow-up.  She was initially seen 02/2022 for the evaluation and management of aortic valve calcification and chest pain. She saw Dr. Marchelle Gearing 01/2021 and reported worsening shortness of breath. The SOB was worse with exertion and she denied chest pain. She declined an inhaler. Echo 07/2021 showed LVEF 72% with grade 1 diastolic dysfunction and normal pulmonary pressures. Chest CT showed coronary and aortic atherosclerosis as well as an adrenal adenoma.    At her first visit her BP was uncontrolled, so valsartan was added. She was also started on rosuvastatin. She followed up with [name], NP 03/2022 and complained of GERD that improved with omeprazole. Her blood pressure was uncontrolled and she had leg cramping. Valsartan was increased.  At her appointment 06/2022 she reported some episodes of palpitations and dizziness.  Blood pressure was uncontrolled in the office.  She was started on HCTZ.  She wore a 24-hour monitor that revealed occasional PACs and PVCs with 20 episodes of SVT lasting up to 13 beats.  He was seen 07/2022 and blood pressure remained uncontrolled.    ROS: ***  Studies Reviewed: .        *** Risk Assessment/Calculations:   {Does this patient have ATRIAL FIBRILLATION?:[phone]} No BP recorded.  {Refresh Note OR Click here to enter BP  :1}***       Physical Exam:   VS:  There were no vitals taken for this visit.   Wt Readings from Last 3 Encounters:  06/13/23 171 lb 14.4 oz (78 kg)  03/11/23 170 lb 4.8 oz (77.2 kg)  09/25/22 172 lb 4.8 oz (78.2  kg)    GEN: Well nourished, well developed in no acute distress NECK: No JVD; No carotid bruits CARDIAC: ***RRR, no murmurs, rubs, gallops RESPIRATORY:  Clear to auscultation without rales, wheezing or rhonchi  ABDOMEN: Soft, non-tender, non-distended EXTREMITIES:  No edema; No deformity   ASSESSMENT AND PLAN: .   ***    {Are you ordering a CV Procedure (e.g. stress test, cath, DCCV, TEE, etc)?   Press F2        :[id number]}  Dispo: ***  Signed, Chilton Si, MD

## 2023-08-29 ENCOUNTER — Other Ambulatory Visit: Payer: Self-pay

## 2023-08-29 ENCOUNTER — Other Ambulatory Visit (INDEPENDENT_AMBULATORY_CARE_PROVIDER_SITE_OTHER): Payer: Medicare Other

## 2023-08-29 ENCOUNTER — Ambulatory Visit: Payer: Medicare Other | Admitting: Orthopedic Surgery

## 2023-08-29 ENCOUNTER — Encounter: Payer: Self-pay | Admitting: Orthopedic Surgery

## 2023-08-29 DIAGNOSIS — M1611 Unilateral primary osteoarthritis, right hip: Secondary | ICD-10-CM | POA: Diagnosis not present

## 2023-08-29 DIAGNOSIS — M79604 Pain in right leg: Secondary | ICD-10-CM

## 2023-08-29 MED ORDER — BUPIVACAINE HCL 0.25 % IJ SOLN
4.0000 mL | INTRAMUSCULAR | Status: AC | PRN
Start: 2023-08-29 — End: 2023-08-29
  Administered 2023-08-29: 4 mL via INTRA_ARTICULAR

## 2023-08-29 MED ORDER — LIDOCAINE HCL 1 % IJ SOLN
5.0000 mL | INTRAMUSCULAR | Status: AC | PRN
Start: 2023-08-29 — End: 2023-08-29
  Administered 2023-08-29: 5 mL

## 2023-08-29 MED ORDER — METHYLPREDNISOLONE ACETATE 80 MG/ML IJ SUSP
80.0000 mg | INTRAMUSCULAR | Status: AC | PRN
Start: 2023-08-29 — End: 2023-08-29
  Administered 2023-08-29: 80 mg via INTRA_ARTICULAR

## 2023-08-29 NOTE — Progress Notes (Signed)
Office Visit Note   Patient: Christina Branch           Date of Birth: March 18, 1941           MRN: [id number] Visit Date: 08/29/2023 Requested by: Corine Shelter, MD [address] [place],  [place] [address] PCP: Corine Shelter, MD  Subjective: Chief Complaint  Patient presents with   Right Leg - Pain    HPI: Christina Branch is a 82 y.o. female who presents to the office reporting rather debilitating right leg pain and hip pain.  She describes swelling.  Had an injection 2 years ago with very good relief for 2 years but most recent injection about a month ago was not helpful.  She does report catching and pain in that right hip.  Has a history of slipped capital femoral epiphysis surgery at age 70.  Hurts her to weight-bear first thing in the morning.  Once started she actually does okay.  She does have to hold onto something to be able to walk.  Denies much in the way of radicular symptoms or back pain..                ROS: All systems reviewed are negative as they relate to the chief complaint within the history of present illness.  Patient denies fevers or chills.  Assessment & Plan: Visit Diagnoses:  1. Pain in right leg     Plan: Impression is end-stage right hip arthritis which has been helped in the past with injections but not recently.  Patient is adamant to avoid surgery.  Would like an intervention today.  I think we could try 1 more injection into the hip joint to see if that could help her pain.  If not she is going to have to live with what she has and likely use a walker or cane.  Hip replacement surgery would be challenging in this circumstance because of the retained hardware but may become necessary if her ambulatory ability decreases.  Follow-Up Instructions: No follow-ups on file.   Orders:  Orders Placed This Encounter  Procedures   XR HIP UNILAT W OR W/O PELVIS 2-3 VIEWS RIGHT   XR Lumbar Spine 2-3 Views   US Guided Needle Placement - No Linked Charges    No orders of the defined types were placed in this encounter.     Procedures: Large Joint Inj: R hip joint on 08/29/2023 9:03 PM Indications: pain and diagnostic evaluation Details: 22 G 3.5 in needle, ultrasound-guided anterior approach  Arthrogram: No  Medications: 5 mL lidocaine 1 %; 80 mg methylPREDNISolone acetate 80 MG/ML; 4 mL bupivacaine 0.25 % Outcome: tolerated well, no immediate complications Procedure, treatment alternatives, risks and benefits explained, specific risks discussed. Consent was given by the patient. Immediately prior to procedure a time out was called to verify the correct patient, procedure, equipment, support staff and site/side marked as required. Patient was prepped and draped in the usual sterile fashion.       Clinical Data: No additional findings.  Objective: Vital Signs: There were no vitals taken for this visit.  Physical Exam:  Constitutional: Patient appears well-developed HEENT:  Head: Normocephalic Eyes:EOM are normal Neck: Normal range of motion Cardiovascular: Normal rate Pulmonary/chest: Effort normal Neurologic: Patient is alert Skin: Skin is warm Psychiatric: Patient has normal mood and affect  Ortho Exam: Ortho exam demonstrates slightly antalgic gait to the right with diminished right hip range of motion both active and passive.  No nerve  root tension signs or paresthesias L1 S1 bilaterally.  Pedal pulses trace palpable in both feet are perfused.  Patient has good ankle dorsiflexion plantarflexion quad and hamstring strength.  Specialty Comments:  No specialty comments available.  Imaging: XR HIP UNILAT W OR W/O PELVIS 2-3 VIEWS RIGHT  Result Date: 08/29/2023 AP pelvis lateral right hip reviewed.  Severe end-stage arthritis with joint space obliteration is present.  No acute fracture.  Hardware in position from prior slipped capital femoral epiphysis surgery.  XR Lumbar Spine 2-3 Views  Result Date: 08/29/2023 AP  lateral radiographs lumbar spine reviewed.  Mild facet arthritis and degenerative disc disease is present throughout the lumbar spine without compression fractures or spondylolisthesis.  US Guided Needle Placement - No Linked Charges  Result Date: 08/29/2023 Ultrasound imaging demonstrates needle placement into the right hip joint with extravasation of fluid into the joint and no complicating features    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   Abdominal pain    Ventral hernia with bowel obstruction    Bowel obstruction (HCC) 08/31/2021   Incarcerated hernia 08/31/2021   Emphysema lung (HCC) 08/03/2021   Tobacco abuse 08/03/2021   Dyspnea 08/03/2021   Peripheral neuropathy 03/07/2020   Primary osteoarthritis of both knees 02/02/2020   Primary osteoarthritis of both hands 02/02/2020   Arthropathy of lumbar facet joint 02/02/2020   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, 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/2020   DDD (degenerative disc disease), cervical 02/02/2020   Atherosclerosis of native artery of extremity with intermittent claudication (HCC) 08/01/2012   Pain in limb 07/04/2012   Peripheral vascular disease, unspecified (HCC) 07/04/2012   Intermittent claudication (HCC) 06/29/2011   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   Claudication (HCC)    Coronary artery calcification 03/19/2022   DJD (degenerative joint disease) of knee    Essential hypertension 03/19/2022   GERD (gastroesophageal reflux disease)    H/O: varicose veins    Right Leg   Hyperlipidemia    Hypertension    Hypoxemia    Myalgia and myositis, unspecified    Pain in limb    Palpitations 07/04/2022   Peripheral neuropathy 03/07/2020   Varicose veins     Family  History  Problem Relation Age of Onset   Cancer Mother    Breast cancer Sister    Hypertension Son     Past Surgical History:  Procedure Laterality Date   CHOLECYSTECTOMY     Open cholecystectomy   COLON SURGERY     partial resection due to large polyps (Benign)   FRACTURE SURGERY  1953   ORIF right hip   HEMORRHOID SURGERY     INGUINAL HERNIA REPAIR N/A 08/31/2021   Procedure: PRIMARY REPAIR INCISIONAL HERNIA; EXCISION OF ABDOMINAL SKIN TAG;  Surgeon: Harriette Bouillon, MD;  Location: [facility];  Service: General;  Laterality: N/A;   LAPAROTOMY N/A 08/31/2021   Procedure: EXPLORATORY LAPAROTOMY;  Surgeon: Harriette Bouillon, MD;  Location: [facility];  Service: General;  Laterality: N/A;   Social History   Occupational History   Not on file  Tobacco Use   Smoking status: Every Day    Current packs/day: 0.50    Average packs/day: 0.5 packs/day for 60.8 years (30.4 ttl pk-yrs)    Types: Cigarettes    Start date:  11/26/1962   Smokeless tobacco: Never   Tobacco comments:    Currently smoking 2-3 cigs occasionally as of 01/25/22  ep  Vaping Use   Vaping status: Never Used  Substance and Sexual Activity   Alcohol use: No   Drug use: No   Sexual activity: Not on file

## 2023-08-30 ENCOUNTER — Encounter (HOSPITAL_COMMUNITY): Payer: Medicare Other

## 2023-09-02 ENCOUNTER — Ambulatory Visit (HOSPITAL_COMMUNITY): Admission: RE | Admit: 2023-09-02 | Payer: Medicare Other | Source: Ambulatory Visit

## 2023-09-02 ENCOUNTER — Ambulatory Visit (HOSPITAL_COMMUNITY): Payer: Medicare Other

## 2023-09-03 ENCOUNTER — Other Ambulatory Visit (HOSPITAL_BASED_OUTPATIENT_CLINIC_OR_DEPARTMENT_OTHER): Payer: Self-pay | Admitting: Family

## 2023-09-03 DIAGNOSIS — I700 Atherosclerosis of aorta: Secondary | ICD-10-CM

## 2023-09-03 DIAGNOSIS — I6523 Occlusion and stenosis of bilateral carotid arteries: Secondary | ICD-10-CM

## 2023-09-03 DIAGNOSIS — I358 Other nonrheumatic aortic valve disorders: Secondary | ICD-10-CM

## 2023-09-03 DIAGNOSIS — I2510 Atherosclerotic heart disease of native coronary artery without angina pectoris: Secondary | ICD-10-CM

## 2023-09-05 ENCOUNTER — Inpatient Hospital Stay (HOSPITAL_COMMUNITY): Admission: RE | Admit: 2023-09-05 | Payer: Medicare Other | Source: Ambulatory Visit

## 2023-09-13 ENCOUNTER — Ambulatory Visit (HOSPITAL_COMMUNITY)
Admission: RE | Admit: 2023-09-13 | Discharge: 2023-09-13 | Disposition: A | Discharge status: Home / Self Care | Payer: Medicare Other | Source: Ambulatory Visit | Attending: Internal Medicine

## 2023-09-13 ENCOUNTER — Ambulatory Visit (HOSPITAL_BASED_OUTPATIENT_CLINIC_OR_DEPARTMENT_OTHER)
Admission: RE | Admit: 2023-09-13 | Discharge: 2023-09-13 | Disposition: A | Discharge status: Home / Self Care | Payer: Medicare Other | Source: Ambulatory Visit | Attending: Internal Medicine

## 2023-09-13 ENCOUNTER — Telehealth (HOSPITAL_BASED_OUTPATIENT_CLINIC_OR_DEPARTMENT_OTHER): Payer: Self-pay

## 2023-09-13 DIAGNOSIS — I700 Atherosclerosis of aorta: Secondary | ICD-10-CM | POA: Insufficient documentation

## 2023-09-13 DIAGNOSIS — I10 Essential (primary) hypertension: Secondary | ICD-10-CM | POA: Insufficient documentation

## 2023-09-13 DIAGNOSIS — I701 Atherosclerosis of renal artery: Secondary | ICD-10-CM

## 2023-09-13 DIAGNOSIS — I6523 Occlusion and stenosis of bilateral carotid arteries: Secondary | ICD-10-CM | POA: Diagnosis present

## 2023-09-13 NOTE — Telephone Encounter (Addendum)
Results called to patient who verbalizes understanding! Repeat carotid ordered, ref to Vasc placed. Patient aware to expect a scheduling call.     ----- Message from Alver Sorrow sent at 09/13/2023  1:14 PM EDT ----- Renal duplex with evidence of significant renal stenosis.  Recommend referral to peripheral vascular (Dr. Kirke Corin or Dr. Allyson Sabal) for further evaluation.   Alver Sorrow, NP  Marlene Lard, RN Carotid duplex with bilateral mild stenosis.  This is stable from previous which is a good result!  Recommend repeat carotid duplex in 1 year for monitoring.

## 2023-09-19 ENCOUNTER — Telehealth: Payer: Self-pay | Admitting: Cardiovascular Disease

## 2023-09-19 NOTE — Telephone Encounter (Signed)
PCP office request recents labs be faxed to PCP office. Pt is in the office now.

## 2023-09-19 NOTE — Telephone Encounter (Signed)
Labs printed and faxed as requested  

## 2023-10-14 ENCOUNTER — Other Ambulatory Visit (HOSPITAL_BASED_OUTPATIENT_CLINIC_OR_DEPARTMENT_OTHER): Payer: Self-pay | Admitting: Family

## 2023-10-14 DIAGNOSIS — I701 Atherosclerosis of renal artery: Secondary | ICD-10-CM

## 2023-10-15 ENCOUNTER — Encounter: Payer: Self-pay | Admitting: Cardiovascular Disease

## 2023-10-15 ENCOUNTER — Ambulatory Visit: Payer: Medicare Other | Attending: Cardiovascular Disease | Admitting: Cardiovascular Disease

## 2023-10-15 VITALS — BP 136/66 | HR 65 | Ht 70.0 in | Wt 169.4 lb

## 2023-10-15 DIAGNOSIS — Z720 Tobacco use: Secondary | ICD-10-CM

## 2023-10-15 DIAGNOSIS — E785 Hyperlipidemia, unspecified: Secondary | ICD-10-CM

## 2023-10-15 DIAGNOSIS — I25118 Atherosclerotic heart disease of native coronary artery with other forms of angina pectoris: Secondary | ICD-10-CM | POA: Diagnosis not present

## 2023-10-15 DIAGNOSIS — M79604 Pain in right leg: Secondary | ICD-10-CM

## 2023-10-15 DIAGNOSIS — I6523 Occlusion and stenosis of bilateral carotid arteries: Secondary | ICD-10-CM | POA: Diagnosis not present

## 2023-10-15 DIAGNOSIS — K551 Chronic vascular disorders of intestine: Secondary | ICD-10-CM

## 2023-10-15 DIAGNOSIS — I701 Atherosclerosis of renal artery: Secondary | ICD-10-CM | POA: Diagnosis not present

## 2023-10-15 NOTE — Patient Instructions (Signed)
Medication Instructions:  No changes *If you need a refill on your cardiac medications before your next appointment, please call your pharmacy*   Lab Work: None ordered If you have labs (blood work) drawn today and your tests are completely normal, you will receive your results only by: MyChart Message (if you have MyChart) OR A paper copy in the mail If you have any lab test that is abnormal or we need to change your treatment, we will call you to review the results.   Testing/Procedures: Your physician has requested that you have a lower extremity segmental doppler. This will take place at [address] [facility], [address].  Please note: We ask at that you not bring children with you during ultrasound (echo/ vascular) testing. Due to room size and safety concerns, children are not allowed in the ultrasound rooms during exams. Our front office staff cannot provide observation of children in our lobby area while testing is being conducted. An adult accompanying a patient to their appointment will only be allowed in the ultrasound room at the discretion of the ultrasound technician under special circumstances. We apologize for any inconvenience.    Follow-Up: At [facility], you and your health needs are our priority.  As part of our continuing mission to provide you with exceptional heart care, we have created designated Provider Care Teams.  These Care Teams include your primary Cardiologist (physician) and Advanced Practice Providers (APPs -  Physician Assistants and Nurse Practitioners) who all work together to provide you with the care you need, when you need it.  We recommend signing up for the patient portal called "MyChart".  Sign up information is provided on this After Visit Summary.  MyChart is used to connect with patients for Virtual Visits (Telemedicine).  Patients are able to view lab/test results, encounter notes, upcoming appointments, etc.  Non-urgent messages can be  sent to your provider as well.   To learn more about what you can do with MyChart, go to [email or web address].    Your next appointment:   3 month(s)  Provider:   Dr. Kirke Corin

## 2023-10-15 NOTE — Progress Notes (Signed)
Cardiology Office Note   Date:  10/15/2023   ID:  Danaisa, Guyette [birth year], MRN [id number]  PCP:  Corine Shelter, MD  Cardiologist:  Dr. Duke Salvia  No chief complaint on file.     History of Present Illness: Christina Branch is a 82 y.o. female who was referred by Dr. Duke Salvia for evaluation of renal artery stenosis.  She has known history of hypertension, hyperlipidemia, COPD, tobacco use, GERD and aortic valve calcifications. Echocardiogram in 2022 showed normal LV systolic function.  Previous CT imaging showed coronary and aortic atherosclerosis as well as adrenal adenoma. She has known history of resistant hypertension.  Workup for secondary hypertension was negative.  Use of thiazide diuretics was associated with worsening renal function.  She has intolerance to antihypertensive medications. Previous antihypertensives: Valsartan -  kidney decline, ineffective Hydralazine - swelling, increased urine output Chlorthalidone - kidney decline Isosorbide - ineffective Spironolactone - reported leg cramps  Renal artery duplex was done in October which showed borderline bilateral renal artery stenosis with peak velocity of 318 on the right and 250 on the left.  Kidney size was equal on both sides at 10 cm.  Most recent labs showed a creatinine of 1.08.  Her blood pressure is now controlled with amlodipine and doxazosin.  She denies chest pain.  She reports stable exertional dyspnea.  She complains of cramping in the right calf and foot especially after she was started on doxazosin.  Symptoms are present at rest and do not worsen with exertion.  She has no history of peripheral arterial disease.  She denies abdominal pain or weight loss.  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   Claudication [facility])    Coronary artery calcification 03/19/2022   DJD (degenerative joint disease) of knee    Essential hypertension 03/19/2022   GERD  (gastroesophageal reflux disease)    H/O: varicose veins    Right Leg   Hyperlipidemia    Hypertension    Hypoxemia    Myalgia and myositis, unspecified    Pain in limb    Palpitations 07/04/2022   Peripheral neuropathy 03/07/2020   Varicose veins     Past Surgical History:  Procedure Laterality Date   CHOLECYSTECTOMY     Open cholecystectomy   COLON SURGERY     partial resection due to large polyps (Benign)   FRACTURE SURGERY  1953   ORIF right hip   HEMORRHOID SURGERY     INGUINAL HERNIA REPAIR N/A 08/31/2021   Procedure: PRIMARY REPAIR INCISIONAL HERNIA; EXCISION OF ABDOMINAL SKIN TAG;  Surgeon: Harriette Bouillon, MD;  Location: [facility];  Service: General;  Laterality: N/A;   LAPAROTOMY N/A 08/31/2021   Procedure: EXPLORATORY LAPAROTOMY;  Surgeon: Harriette Bouillon, MD;  Location: [facility];  Service: General;  Laterality: N/A;     Current Outpatient Medications  Medication Sig Dispense Refill   amLODipine (NORVASC) 10 MG tablet Take 10 mg by mouth daily.      diclofenac Sodium (VOLTAREN) 1 % GEL Apply 2 g topically daily as needed (pain).     doxazosin (CARDURA) 2 MG tablet Take 1 tablet (2 mg total) by mouth daily. 90 tablet 1   HYDROcodone-ibuprofen (VICOPROFEN) 7.5-200 MG per tablet Take 1 tablet by mouth every 6 (six) hours as needed for moderate pain.      loratadine (CLARITIN) 10 MG tablet Take 10 mg by mouth daily as needed for allergies.  4   Multiple Vitamins-Minerals (CENTRUM SILVER PO)  Take 1 tablet by mouth daily.      Vitamin D, Ergocalciferol, (DRISDOL) 50000 units CAPS capsule Take 50,000 Units by mouth every 7 (seven) days.  4   No current facility-administered medications for this visit.    Allergies:   Penicillins, Shellfish allergy, Sulfa antibiotics, and Tuna [fish allergy]    Social History:  The patient  reports that she has been smoking cigarettes. She started smoking about 60 years ago. She has a 30.4 pack-year smoking history. She has never used smokeless  tobacco. She reports that she does not drink alcohol and does not use drugs.   Family History:  The patient's family history includes Breast cancer in her sister; Cancer in her mother; Hypertension in her son.    ROS:  Please see the history of present illness.   Otherwise, review of systems are positive for none.   All other systems are reviewed and negative.    PHYSICAL EXAM: VS:  BP 136/66 (BP Location: Left Arm, Patient Position: Sitting, Cuff Size: Normal)   Pulse 65   Ht 5\' 10"  (1.778 m)   Wt 169 lb 6.4 oz (76.8 kg)   SpO2 98%   BMI 24.31 kg/m  , BMI Body mass index is 24.31 kg/m. GEN: Well nourished, well developed, in no acute distress  HEENT: normal  Neck: no JVD, carotid bruits, or masses Cardiac: RRR; no rubs, or gallops,no edema .  1 out of 6 systolic murmur in the aortic area. Respiratory:  clear to auscultation bilaterally, normal work of breathing GI: soft, nontender, nondistended, + BS MS: no deformity or atrophy  Skin: warm and dry, no rash Neuro:  Strength and sensation are intact Psych: euthymic mood, full affect Vascular: Dorsalis pedis is not palpable on the right and +2 on the left.  Posterior tibial is not palpable bilaterally   EKG:  EKG is not ordered today.    Recent Labs: 06/13/2023: BUN 13; Creatinine, Ser 1.08; Potassium 4.9; Sodium 141; TSH 1.270    Lipid Panel    Component Value Date/Time   CHOL 191 06/21/2022 1621   TRIG 116 06/21/2022 1621   HDL 63 06/21/2022 1621   CHOLHDL 3.0 06/21/2022 1621   LDLCALC 108 (H) 06/21/2022 1621      Wt Readings from Last 3 Encounters:  10/15/23 169 lb 6.4 oz (76.8 kg)  06/13/23 171 lb 14.4 oz (78 kg)  03/11/23 170 lb 4.8 oz (77.2 kg)          No data to display            ASSESSMENT AND PLAN:  1.  Renal artery stenosis: I reviewed renal artery duplex.  She has borderline stenosis in bilateral renal arteries that I do not think is critical.  In addition, her blood pressure is now  controlled on 2 medications and renal function is stable.  Thus, there is no indication for renal artery revascularization.  Recommend continuing medical therapy.  2.  Coronary atherosclerosis: No convincing symptoms of angina.  3.  Carotid artery disease: Carotid Doppler showed mild nonobstructive disease bilaterally.  4.  Mesenteric artery stenosis: Completely asymptomatic with no evidence of chronic venous insufficiency.  5.  Hyperlipidemia: Her LDL was 107 last year.  Given evidence of atherosclerosis, I do recommend treatment with a statin with a target LDL of less than 70 but the patient is very hesitant today about starting treatment.  6.  Tobacco use: I disc the importance of smoking cessation.  7.  Atypical right  leg claudication: Abnormal vascular exam with diminished pulses especially on the right side.  I requested lower extremity arterial Doppler.   Disposition:   FU with me in 3 months  Signed,  Lorine Bears, MD  10/15/2023 1:11 PM    [facility]

## 2023-10-29 ENCOUNTER — Ambulatory Visit (HOSPITAL_COMMUNITY)
Admission: RE | Admit: 2023-10-29 | Discharge: 2023-10-29 | Disposition: A | Discharge status: Home / Self Care | Payer: Medicare Other | Source: Ambulatory Visit | Attending: Cardiology | Admitting: Cardiology

## 2023-10-29 DIAGNOSIS — M79604 Pain in right leg: Secondary | ICD-10-CM | POA: Insufficient documentation

## 2023-10-29 LAB — VAS US LOWER EXT ART SEG MULTI (SEGMENTALS & LE RAYNAUDS)
Left ABI: 0.76
Right ABI: 0.57

## 2023-11-01 ENCOUNTER — Telehealth: Payer: Self-pay | Admitting: Cardiovascular Disease

## 2023-11-01 DIAGNOSIS — I739 Peripheral vascular disease, unspecified: Secondary | ICD-10-CM

## 2023-11-01 NOTE — Telephone Encounter (Signed)
The patient has been made aware and verbalized her understanding. Orders have been placed and message sent to scheduling.   Iran Ouch, MD  Sandi Mariscal, RN ABI significantly abnormal especially on the right side which confirms peripheral arterial disease.  Schedule an LEA and follow-up with me as planned to see if any intervention is needed.

## 2023-11-01 NOTE — Telephone Encounter (Signed)
Patient returned RN's call regarding results. 

## 2023-12-05 ENCOUNTER — Ambulatory Visit (HOSPITAL_COMMUNITY)
Admission: RE | Admit: 2023-12-05 | Discharge: 2023-12-05 | Disposition: A | Discharge status: Home / Self Care | Payer: Medicare Other | Source: Ambulatory Visit | Attending: Cardiology | Admitting: Cardiology

## 2023-12-05 ENCOUNTER — Ambulatory Visit (HOSPITAL_BASED_OUTPATIENT_CLINIC_OR_DEPARTMENT_OTHER)
Admission: RE | Admit: 2023-12-05 | Discharge: 2023-12-05 | Disposition: A | Discharge status: Home / Self Care | Payer: Medicare Other | Source: Ambulatory Visit | Attending: Cardiology | Admitting: Cardiology

## 2023-12-05 ENCOUNTER — Other Ambulatory Visit: Payer: Self-pay | Admitting: Cardiovascular Disease

## 2023-12-05 ENCOUNTER — Ambulatory Visit (HOSPITAL_COMMUNITY): Admission: RE | Admit: 2023-12-05 | Payer: Medicare Other | Source: Ambulatory Visit

## 2023-12-05 DIAGNOSIS — I739 Peripheral vascular disease, unspecified: Secondary | ICD-10-CM | POA: Diagnosis present

## 2023-12-05 LAB — VAS US ABI WITH/WO TBI
Left ABI: 0.77
Right ABI: 0.58

## 2023-12-10 ENCOUNTER — Ambulatory Visit: Payer: Medicare Other | Attending: Cardiovascular Disease | Admitting: Cardiovascular Disease

## 2023-12-10 ENCOUNTER — Encounter: Payer: Self-pay | Admitting: Cardiovascular Disease

## 2023-12-10 VITALS — BP 130/66 | HR 60 | Ht 70.0 in | Wt 171.6 lb

## 2023-12-10 DIAGNOSIS — I739 Peripheral vascular disease, unspecified: Secondary | ICD-10-CM

## 2023-12-10 DIAGNOSIS — E785 Hyperlipidemia, unspecified: Secondary | ICD-10-CM

## 2023-12-10 DIAGNOSIS — K551 Chronic vascular disorders of intestine: Secondary | ICD-10-CM | POA: Diagnosis not present

## 2023-12-10 DIAGNOSIS — I6523 Occlusion and stenosis of bilateral carotid arteries: Secondary | ICD-10-CM | POA: Diagnosis not present

## 2023-12-10 DIAGNOSIS — I2510 Atherosclerotic heart disease of native coronary artery without angina pectoris: Secondary | ICD-10-CM

## 2023-12-10 DIAGNOSIS — I701 Atherosclerosis of renal artery: Secondary | ICD-10-CM

## 2023-12-10 DIAGNOSIS — Z720 Tobacco use: Secondary | ICD-10-CM

## 2023-12-10 MED ORDER — ATORVASTATIN CALCIUM 20 MG PO TABS
20.0000 mg | ORAL_TABLET | Freq: Every day | ORAL | 3 refills | Status: DC
Start: 2023-12-10 — End: 2024-01-10

## 2023-12-10 MED ORDER — ASPIRIN 81 MG PO TBEC: 81.0000 mg | DELAYED_RELEASE_TABLET | Freq: Every day | ORAL | Status: AC

## 2023-12-10 NOTE — Progress Notes (Signed)
 Cardiology Office Note   Date:  12/10/2023   ID:  Christina, Branch January 16, 1941, MRN [id number]  PCP:  Hillman Bare, MD  Cardiologist:  Dr. Raford  No chief complaint on file.     History of Present Illness: Christina Branch is a 83 y.o. female who is here today for a follow-up visit regarding renal artery stenosis and peripheral arterial disease.   She has known history of hypertension, hyperlipidemia, COPD, tobacco use, GERD and aortic valve calcifications. Echocardiogram in 2022 showed normal LV systolic function.  Previous CT imaging showed coronary and aortic atherosclerosis as well as adrenal adenoma. She has known history of resistant hypertension.  Workup for secondary hypertension was negative.  She has intolerance to antihypertensive medications.  Renal artery duplex was done in October which showed borderline bilateral renal artery stenosis with peak velocity of 318 on the right and 250 on the left.  Kidney size was equal on both sides at 10 cm.  I recommended medical therapy for renal artery stenosis given that her blood pressure was controlled on 2 medications. She did complain of bilateral leg claudication that was worse on the right side with abnormal vascular exam.  I requested lower extremity arterial Doppler studies which showed an ABI of 0.58 on the right and 0.77 on the left. Duplex on the right showed occluded proximal SFA.  On the left, there was severe stenosis in the mid common femoral artery.   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   Claudication [facility])    Coronary artery calcification 03/19/2022   DJD (degenerative joint disease) of knee    Essential hypertension 03/19/2022   GERD (gastroesophageal reflux disease)    H/O: varicose veins    Right Leg   Hyperlipidemia    Hypertension    Hypoxemia    Myalgia and myositis, unspecified    Pain in limb    Palpitations 07/04/2022   Peripheral neuropathy 03/07/2020    Varicose veins     Past Surgical History:  Procedure Laterality Date   CHOLECYSTECTOMY     Open cholecystectomy   COLON SURGERY     partial resection due to large polyps (Benign)   FRACTURE SURGERY  1953   ORIF right hip   HEMORRHOID SURGERY     INGUINAL HERNIA REPAIR N/A 08/31/2021   Procedure: PRIMARY REPAIR INCISIONAL HERNIA; EXCISION OF ABDOMINAL SKIN TAG;  Surgeon: Vanderbilt Ned, MD;  Location: [facility];  Service: General;  Laterality: N/A;   LAPAROTOMY N/A 08/31/2021   Procedure: EXPLORATORY LAPAROTOMY;  Surgeon: Vanderbilt Ned, MD;  Location: [facility];  Service: General;  Laterality: N/A;     Current Outpatient Medications  Medication Sig Dispense Refill   amLODipine  (NORVASC ) 10 MG tablet Take 10 mg by mouth daily.      aspirin  EC 81 MG tablet Take 1 tablet (81 mg total) by mouth daily. Swallow whole.     atorvastatin  (LIPITOR) 20 MG tablet Take 1 tablet (20 mg total) by mouth daily. 90 tablet 3   diclofenac  Sodium (VOLTAREN ) 1 % GEL Apply 2 g topically daily as needed (pain).     doxazosin  (CARDURA ) 2 MG tablet Take 1 tablet (2 mg total) by mouth daily. 90 tablet 1   HYDROcodone -ibuprofen  (VICOPROFEN) 7.5-200 MG per tablet Take 1 tablet by mouth every 6 (six) hours as needed for moderate pain.      loratadine (CLARITIN) 10 MG tablet Take 10 mg by mouth daily as needed  for allergies.  4   Multiple Vitamins-Minerals (CENTRUM SILVER PO) Take 1 tablet by mouth daily.      Vitamin D, Ergocalciferol, (DRISDOL) 50000 units CAPS capsule Take 50,000 Units by mouth every 7 (seven) days.  4   No current facility-administered medications for this visit.    Allergies:   Penicillins, Shellfish allergy, Sulfa antibiotics, and Tuna [fish allergy]    Social History:  The patient  reports that she has been smoking cigarettes. She started smoking about 61 years ago. She has a 30.5 pack-year smoking history. She has never used smokeless tobacco. She reports that she does not drink alcohol and  does not use drugs.   Family History:  The patient's family history includes Breast cancer in her sister; Cancer in her mother; Hypertension in her son.    ROS:  Please see the history of present illness.   Otherwise, review of systems are positive for none.   All other systems are reviewed and negative.    PHYSICAL EXAM: VS:  BP 130/66 (BP Location: Left Arm, Patient Position: Sitting)   Pulse 60   Ht 5' 10 (1.778 m)   Wt 171 lb 9.6 oz (77.8 kg)   SpO2 93%   BMI 24.62 kg/m  , BMI Body mass index is 24.62 kg/m. GEN: Well nourished, well developed, in no acute distress  HEENT: normal  Neck: no JVD, carotid bruits, or masses Cardiac: RRR; no rubs, or gallops,no edema .  1 out of 6 systolic murmur in the aortic area. Respiratory:  clear to auscultation bilaterally, normal work of breathing GI: soft, nontender, nondistended, + BS MS: no deformity or atrophy  Skin: warm and dry, no rash Neuro:  Strength and sensation are intact Psych: euthymic mood, full affect Vascular: Dorsalis pedis is not palpable on the right and +2 on the left.  Posterior tibial is not palpable bilaterally   EKG:  EKG is ordered today. EKG showed: Normal sinus rhythm Normal ECG     Recent Labs: 06/13/2023: BUN 13; Creatinine, Ser 1.08; Potassium 4.9; Sodium 141; TSH 1.270    Lipid Panel    Component Value Date/Time   CHOL 191 06/21/2022 1621   TRIG 116 06/21/2022 1621   HDL 63 06/21/2022 1621   CHOLHDL 3.0 06/21/2022 1621   LDLCALC 108 (H) 06/21/2022 1621      Wt Readings from Last 3 Encounters:  12/10/23 171 lb 9.6 oz (77.8 kg)  10/15/23 169 lb 6.4 oz (76.8 kg)  06/13/23 171 lb 14.4 oz (78 kg)          No data to display            ASSESSMENT AND PLAN:  1.  Renal artery stenosis: I reviewed renal artery duplex.  She has borderline stenosis in bilateral renal arteries that I do not think is critical.  Her blood pressure is controlled on 2 medications and she has no chronic  kidney disease.  Recommend continuing medical therapy.  2.  Peripheral arterial disease with moderate claudication: I discussed the natural history of claudication and the importance of risk factor management. I added aspirin  81 mg once daily. I referred her to a structured exercise therapy program. The patient was educated on risk factors surrounding PAD. I discussed the importance of controlling risk factors and importance of regular exercise to help reduce pain. I discussed the effects of exercise on reducing claudications and improving his risk factors including hyperlipidemia and hypertension.   3.  Carotid artery disease: Carotid  Doppler showed mild nonobstructive disease bilaterally.  4.  Mesenteric artery stenosis: Completely asymptomatic with no evidence of chronic mesenteric ischemia.  5.  Hyperlipidemia: Her LDL was 107 last year.  She was hesitant to start treatment before but I discussed the rationale for treatment with a statin and she is agreeable.  I added atorvastatin  20 mg daily.  6.  Tobacco use: I I again discussed the importance of smoking cessation.  7.  Coronary atherosclerosis.  No angina at the present time.   Disposition:   FU with me in 6 months  Signed,  Deatrice Cage, MD  12/10/2023 11:28 AM    [facility]

## 2023-12-10 NOTE — Patient Instructions (Addendum)
 Medication Instructions:  START  Aspirin  81 mg once daily  START Atorvastatin  20 mg once daily  *If you need a refill on your cardiac medications before your next appointment, please call your pharmacy*   Lab Work: None ordered If you have labs (blood work) drawn today and your tests are completely normal, you will receive your results only by: MyChart Message (if you have MyChart) OR A paper copy in the mail If you have any lab test that is abnormal or we need to change your treatment, we will call you to review the results.   Testing/Procedures: None ordered   Follow-Up: At [facility], you and your health needs are our priority.  As part of our continuing mission to provide you with exceptional heart care, we have created designated Provider Care Teams.  These Care Teams include your primary Cardiologist (physician) and Advanced Practice Providers (APPs -  Physician Assistants and Nurse Practitioners) who all work together to provide you with the care you need, when you need it.  We recommend signing up for the patient portal called MyChart.  Sign up information is provided on this After Visit Summary.  MyChart is used to connect with patients for Virtual Visits (Telemedicine).  Patients are able to view lab/test results, encounter notes, upcoming appointments, etc.  Non-urgent messages can be sent to your provider as well.   To learn more about what you can do with MyChart, go to [email or web address].    Your next appointment:   6 month(s)  Provider:   Dr. Darron Other Instructions A referral has been placed to [facility]. They will call you to make an appointment  Managing the Challenge of Quitting Smoking Quitting smoking is a physical and mental challenge. You may have cravings, withdrawal symptoms, and temptation to smoke. Before quitting, work with your health care provider to make a plan that can help you manage quitting. Making a plan before you quit  may keep you from smoking when you have the urge to smoke while trying to quit. How to manage lifestyle changes Managing stress Stress can make you want to smoke, and wanting to smoke may cause stress. It is important to find ways to manage your stress. You could try some of the following: Practice relaxation techniques. Breathe slowly and deeply, in through your nose and out through your mouth. Listen to music. Soak in a bath or take a shower. Imagine a peaceful place or vacation. Get some support. Talk with family or friends about your stress. Join a support group. Talk with a counselor or therapist. Get some physical activity. Go for a walk, run, or bike ride. Play a favorite sport. Practice yoga.  Medicines Talk with your health care provider about medicines that might help you deal with cravings and make quitting easier for you. Relationships Social situations can be difficult when you are quitting smoking. To manage this, you can: Avoid parties and other social situations where people might be smoking. Avoid alcohol. Leave right away if you have the urge to smoke. Explain to your family and friends that you are quitting smoking. Ask for support and let them know you might be a bit grumpy. Plan activities where smoking is not an option. General instructions Be aware that many people gain weight after they quit smoking. However, not everyone does. To keep from gaining weight, have a plan in place before you quit, and stick to the plan after you quit. Your plan should include: Eating healthy snacks. When  you have a craving, it may help to: Eat popcorn, or try carrots, celery, or other cut vegetables. Chew sugar-free gum. Changing how you eat. Eat small portion sizes at meals. Eat 4-6 small meals throughout the day instead of 1-2 large meals a day. Be mindful when you eat. You should avoid watching television or doing other things that might distract you as you eat. Exercising  regularly. Make time to exercise each day. If you do not have time for a long workout, do short bouts of exercise for 5-10 minutes several times a day. Do some form of strengthening exercise, such as weight lifting. Do some exercise that gets your heart beating and causes you to breathe deeply, such as walking fast, running, swimming, or biking. This is very important. Drinking plenty of water or other low-calorie or no-calorie drinks. Drink enough fluid to keep your urine pale yellow.  How to recognize withdrawal symptoms Your body and mind may experience discomfort as you try to get used to not having nicotine in your system. These effects are called withdrawal symptoms. They may include: Feeling hungrier than normal. Having trouble concentrating. Feeling irritable or restless. Having trouble sleeping. Feeling depressed. Craving a cigarette. These symptoms may surprise you, but they are normal to have when quitting smoking. To manage withdrawal symptoms: Avoid places, people, and activities that trigger your cravings. Remember why you want to quit. Get plenty of sleep. Avoid coffee and other drinks that contain caffeine. These may worsen some of your symptoms. How to manage cravings Come up with a plan for how to deal with your cravings. The plan should include the following: A definition of the specific situation you want to deal with. An activity or action you will take to replace smoking. A clear idea for how this action will help. The name of someone who could help you with this. Cravings usually last for 5-10 minutes. Consider taking the following actions to help you with your plan to deal with cravings: Keep your mouth busy. Chew sugar-free gum. Suck on hard candies or a straw. Brush your teeth. Keep your hands and body busy. Change to a different activity right away. Squeeze or play with a ball. Do an activity or a hobby, such as making bead jewelry, practicing needlepoint,  or working with wood. Mix up your normal routine. Take a short exercise break. Go for a quick walk, or run up and down stairs. Focus on doing something kind or helpful for someone else. Call a friend or family member to talk during a craving. Join a support group. Contact a quitline. Where to find support To get help or find a support group: Call the National Cancer Institute's Smoking Quitline: 1-800-QUIT-NOW 252-022-2768) Text QUIT to SmokefreeTXT: 521151 Where to find more information Visit these websites to find more information on quitting smoking: U.S. Department of Health and Human Services: www.smokefree.gov American Lung Association: www.freedomfromsmoking.org Centers for Disease Control and Prevention (CDC): footballexhibition.com.br American Heart Association: www.heart.org Contact a health care provider if: You want to change your plan for quitting. The medicines you are taking are not helping. Your eating feels out of control or you cannot sleep. You feel depressed or become very anxious. Summary Quitting smoking is a physical and mental challenge. You will face cravings, withdrawal symptoms, and temptation to smoke again. Preparation can help you as you go through these challenges. Try different techniques to manage stress, handle social situations, and prevent weight gain. You can deal with cravings by keeping your mouth  busy (such as by chewing gum), keeping your hands and body busy, calling family or friends, or contacting a quitline for people who want to quit smoking. You can deal with withdrawal symptoms by avoiding places where people smoke, getting plenty of rest, and avoiding drinks that contain caffeine. This information is not intended to replace advice given to you by your health care provider. Make sure you discuss any questions you have with your health care provider. Document Revised: 11/03/2021 Document Reviewed: 11/03/2021 Elsevier Patient Education  2024 [employer].

## 2023-12-13 ENCOUNTER — Telehealth (HOSPITAL_COMMUNITY): Payer: Self-pay

## 2023-12-13 NOTE — Telephone Encounter (Signed)
Attempted to reach pt to inform her of SET for claudication. Pt did not answer. I left VM.

## 2023-12-17 ENCOUNTER — Telehealth (HOSPITAL_COMMUNITY): Payer: Self-pay

## 2023-12-17 ENCOUNTER — Other Ambulatory Visit (HOSPITAL_BASED_OUTPATIENT_CLINIC_OR_DEPARTMENT_OTHER): Payer: Self-pay | Admitting: Family

## 2023-12-17 DIAGNOSIS — I10 Essential (primary) hypertension: Secondary | ICD-10-CM

## 2023-12-17 NOTE — Telephone Encounter (Signed)
Pt insurance is active and benefits verified through [facility]. Co-pay $40.00, DED $0.00/$0.00 met, out of pocket $3,900.00/$0.00 met, co-insurance 0%. No pre-authorization required. Christina A./[employer], 12/17/23 @ 3:32PM, FAO#[id number]

## 2023-12-17 NOTE — Telephone Encounter (Signed)
Received referral from Dr. 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  Pt seen in follow up on  12/10/23. The patient received information regarding cardiovascular disease and PAD risk factor reduction, which could include education, counseling, behavior interventions, and outcome assessments. Pt had ABI completed on 12/05/23 which showed 0.58 Right and 0.77 Left.  I spoke with with Ms Adair on the phone to discuss supervised exercise therapy and how it could help alleviate her claudication pain. Pt voiced she is interested in joining the program. She is tentatively scheduled for orientation on 12/24/23 at 11:45am and will join the 12:30pm class.   Faustino Congress 12/17/2023 3:00 PM

## 2023-12-23 ENCOUNTER — Telehealth (HOSPITAL_COMMUNITY): Payer: Self-pay

## 2023-12-23 NOTE — Telephone Encounter (Signed)
Pt called and stated she will not be attending the SET program due to cost. Also, stated she talked to her insurance company and she can/will be going to the [facility] to exercise.

## 2023-12-24 ENCOUNTER — Ambulatory Visit (HOSPITAL_COMMUNITY): Payer: Medicare Other

## 2023-12-25 ENCOUNTER — Ambulatory Visit (HOSPITAL_COMMUNITY): Payer: Medicare Other

## 2023-12-27 ENCOUNTER — Ambulatory Visit (HOSPITAL_COMMUNITY): Payer: Medicare Other

## 2023-12-30 ENCOUNTER — Ambulatory Visit (HOSPITAL_COMMUNITY): Payer: Medicare Other

## 2024-01-01 ENCOUNTER — Ambulatory Visit (HOSPITAL_COMMUNITY): Payer: Medicare Other

## 2024-01-03 ENCOUNTER — Ambulatory Visit (HOSPITAL_COMMUNITY): Payer: Medicare Other

## 2024-01-06 ENCOUNTER — Ambulatory Visit (HOSPITAL_COMMUNITY): Payer: Medicare Other

## 2024-01-08 ENCOUNTER — Ambulatory Visit (HOSPITAL_COMMUNITY): Payer: Medicare Other

## 2024-01-10 ENCOUNTER — Ambulatory Visit (HOSPITAL_COMMUNITY): Payer: Medicare Other

## 2024-01-10 ENCOUNTER — Ambulatory Visit (HOSPITAL_BASED_OUTPATIENT_CLINIC_OR_DEPARTMENT_OTHER): Payer: Medicare Other | Admitting: Cardiovascular Disease

## 2024-01-10 VITALS — BP 142/64 | HR 67 | Ht 70.0 in | Wt 172.0 lb

## 2024-01-10 DIAGNOSIS — I2510 Atherosclerotic heart disease of native coronary artery without angina pectoris: Secondary | ICD-10-CM

## 2024-01-10 DIAGNOSIS — I358 Other nonrheumatic aortic valve disorders: Secondary | ICD-10-CM

## 2024-01-10 DIAGNOSIS — Z720 Tobacco use: Secondary | ICD-10-CM

## 2024-01-10 DIAGNOSIS — I10 Essential (primary) hypertension: Secondary | ICD-10-CM

## 2024-01-10 DIAGNOSIS — I739 Peripheral vascular disease, unspecified: Secondary | ICD-10-CM

## 2024-01-10 DIAGNOSIS — I700 Atherosclerosis of aorta: Secondary | ICD-10-CM

## 2024-01-10 DIAGNOSIS — J438 Other emphysema: Secondary | ICD-10-CM

## 2024-01-10 MED ORDER — REPATHA SURECLICK 140 MG/ML ~~LOC~~ SOAJ: 140.0000 mg | SUBCUTANEOUS | Status: DC

## 2024-01-10 NOTE — Patient Instructions (Addendum)
Medication Instructions: Your physician has recommended you make the following change in your medication:   Stop: atorvastatin   We are going to try a repatha sample to see if you tolerate   *If you need a refill on your cardiac medications before your next appointment, please call your pharmacy*   Lab Work: Your physician recommends that you return for lab work today for Urinalysis    Follow-Up: At [facility], you and your health needs are our priority.  As part of our continuing mission to provide you with exceptional heart care, we have created designated Provider Care Teams.  These Care Teams include your primary Cardiologist (physician) and Advanced Practice Providers (APPs -  Physician Assistants and Nurse Practitioners) who all work together to provide you with the care you need, when you need it.  We recommend signing up for the patient portal called "MyChart".  Sign up information is provided on this After Visit Summary.  MyChart is used to connect with patients for Virtual Visits (Telemedicine).  Patients are able to view lab/test results, encounter notes, upcoming appointments, etc.  Non-urgent messages can be sent to your provider as well.   To learn more about what you can do with MyChart, go to [email or web address].    Your next appointment:   6 months with Dr. Duke Salvia, [name], NP or Eligha Bridegroom, NP   Other Instructions Managing the Challenge of Quitting Smoking Quitting smoking is a physical and mental challenge. You may have cravings, withdrawal symptoms, and temptation to smoke. Before quitting, work with your health care provider to make a plan that can help you manage quitting. Making a plan before you quit may keep you from smoking when you have the urge to smoke while trying to quit. How to manage lifestyle changes Managing stress Stress can make you want to smoke, and wanting to smoke may cause stress. It is important to find ways to manage  your stress. You could try some of the following: Practice relaxation techniques. Breathe slowly and deeply, in through your nose and out through your mouth. Listen to music. Soak in a bath or take a shower. Imagine a peaceful place or vacation. Get some support. Talk with family or friends about your stress. Join a support group. Talk with a counselor or therapist. Get some physical activity. Go for a walk, run, or bike ride. Play a favorite sport. Practice yoga.  Medicines Talk with your health care provider about medicines that might help you deal with cravings and make quitting easier for you. Relationships Social situations can be difficult when you are quitting smoking. To manage this, you can: Avoid parties and other social situations where people might be smoking. Avoid alcohol. Leave right away if you have the urge to smoke. Explain to your family and friends that you are quitting smoking. Ask for support and let them know you might be a bit grumpy. Plan activities where smoking is not an option. General instructions Be aware that many people gain weight after they quit smoking. However, not everyone does. To keep from gaining weight, have a plan in place before you quit, and stick to the plan after you quit. Your plan should include: Eating healthy snacks. When you have a craving, it may help to: Eat popcorn, or try carrots, celery, or other cut vegetables. Chew sugar-free gum. Changing how you eat. Eat small portion sizes at meals. Eat 4-6 small meals throughout the day instead of 1-2 large meals a day. Be  mindful when you eat. You should avoid watching television or doing other things that might distract you as you eat. Exercising regularly. Make time to exercise each day. If you do not have time for a long workout, do short bouts of exercise for 5-10 minutes several times a day. Do some form of strengthening exercise, such as weight lifting. Do some exercise that gets  your heart beating and causes you to breathe deeply, such as walking fast, running, swimming, or biking. This is very important. Drinking plenty of water or other low-calorie or no-calorie drinks. Drink enough fluid to keep your urine pale yellow.  How to recognize withdrawal symptoms Your body and mind may experience discomfort as you try to get used to not having nicotine in your system. These effects are called withdrawal symptoms. They may include: Feeling hungrier than normal. Having trouble concentrating. Feeling irritable or restless. Having trouble sleeping. Feeling depressed. Craving a cigarette. These symptoms may surprise you, but they are normal to have when quitting smoking. To manage withdrawal symptoms: Avoid places, people, and activities that trigger your cravings. Remember why you want to quit. Get plenty of sleep. Avoid coffee and other drinks that contain caffeine. These may worsen some of your symptoms. How to manage cravings Come up with a plan for how to deal with your cravings. The plan should include the following: A definition of the specific situation you want to deal with. An activity or action you will take to replace smoking. A clear idea for how this action will help. The name of someone who could help you with this. Cravings usually last for 5-10 minutes. Consider taking the following actions to help you with your plan to deal with cravings: Keep your mouth busy. Chew sugar-free gum. Suck on hard candies or a straw. Brush your teeth. Keep your hands and body busy. Change to a different activity right away. Squeeze or play with a ball. Do an activity or a hobby, such as making bead jewelry, practicing needlepoint, or working with wood. Mix up your normal routine. Take a short exercise break. Go for a quick walk, or run up and down stairs. Focus on doing something kind or helpful for someone else. Call a friend or family member to talk during a  craving. Join a support group. Contact a quitline. Where to find support To get help or find a support group: Call the National Cancer Institute's Smoking Quitline: 1-800-QUIT-NOW (586)581-5373) Text QUIT to SmokefreeTXT: 147829 Where to find more information Visit these websites to find more information on quitting smoking: U.S. Department of Health and Human Services: www.smokefree.gov American Lung Association: www.freedomfromsmoking.org Centers for Disease Control and Prevention (CDC): FootballExhibition.com.br American Heart Association: www.heart.org Contact a health care provider if: You want to change your plan for quitting. The medicines you are taking are not helping. Your eating feels out of control or you cannot sleep. You feel depressed or become very anxious. Summary Quitting smoking is a physical and mental challenge. You will face cravings, withdrawal symptoms, and temptation to smoke again. Preparation can help you as you go through these challenges. Try different techniques to manage stress, handle social situations, and prevent weight gain. You can deal with cravings by keeping your mouth busy (such as by chewing gum), keeping your hands and body busy, calling family or friends, or contacting a quitline for people who want to quit smoking. You can deal with withdrawal symptoms by avoiding places where people smoke, getting plenty of rest, and avoiding drinks  that contain caffeine. This information is not intended to replace advice given to you by your health care provider. Make sure you discuss any questions you have with your health care provider. Document Revised: 11/03/2021 Document Reviewed: 11/03/2021 Elsevier Patient Education  2024 [employer].

## 2024-01-10 NOTE — Progress Notes (Signed)
 Cardiology Office Note:  .   Date:  01/29/2024  ID:  Christina Branch, DOB 29-Aug-1941, MRN [id number] PCP: Corine Shelter, MD  [facility] Providers Cardiologist:  Chilton Si, MD PV Cardiologist:  Lorine Bears, MD     History of Present Illness: .    Christina Branch is a 83 y.o. female with a hx of aortic atherosclerosis, PAD, hypertension, hyperlipidemia, emphysema, tobacco abuse, and GERD here today for follow-up.  She was initially seen 02/2022 for the evaluation and management of aortic valve calcification and chest pain. She saw Dr. Marchelle Gearing 01/2021 and reported worsening shortness of breath. The SOB was worse with exertion and she denied chest pain. She declined an inhaler. Echo 07/2021 showed LVEF 72% with grade 1 diastolic dysfunction and normal pulmonary pressures. Chest CT showed coronary and aortic atherosclerosis as well as an adrenal adenoma.    At her first visit her BP was uncontrolled, so valsartan was added. She was also started on rosuvastatin. She followed up with [name], NP 03/2022 and complained of GERD that improved with omeprazole. Her blood pressure was uncontrolled and she had leg cramping. Valsartan was increased.  At her visit 06/2022 she was not doing well.  On 06/20/22, she had an episode of her heart racing.  She wore a 14-day monitor that revealed 20 episodes of SVT and occasional PACs and PVCs.  Blood pressure remained uncontrolled so chlorthalidone was added.  However this was discontinued due to worsening renal function.  Given her palpitations and adrenal adenoma lab work including thyroid, renin, aldosterone, catecholamines, and metanephrines were checked but were all within normal limits.  Carotid Dopplers revealed mild stenosis and she had 70-99% stenosis of the mesenteric arteries but was asymptomatic so medical management was recommended.  She followed up with [name], NP and blood pressure remained uncontrolled.  She declined additional  medication changes.  She reported leg cramping since starting rosuvastatin so it was discontinued.  Leg cramping improved and she was started on pravastatin.  At follow-up she was started on spironolactone but noted leg cramping so it was discontinued and switched to doxazosin.  She declined additional medication changes.  She saw Dr. Kirke Corin for claudication and had ABIs which were abnormal (0.58 on the right 0.77 on the left).  Arterial Dopplers indicated occlusion of the proximal SFA on the right and severe stenosis of the mid common femoral on the left.  Her renal artery stenosis was not thought to be critical and medical management was recommended.  Aspirin 81 was added and she was referred to a structured exercise program.  He convinced her to start atorvastatin.  [name] presents with abdominal discomfort and urinary symptoms.  She experiences a sensation of pressure in her abdominal area since starting atorvastatin, describing it as a pressing feeling in the stomach area, associated with increased reflux symptoms. Discontinuing the medication for three days alleviated the discomfort, but symptoms returned upon resuming the medication.  She has increased urinary frequency and a noticeable odor in her urine since starting the medication. No burning sensation during urination is noted. She has a history of urinary tract infections but states that her current symptoms do not resemble those of a UTI.  She has high cholesterol and has been on atorvastatin, which she associates with her current symptoms. She previously tried a different cholesterol medication that caused muscle cramps.  She has peripheral artery disease with a known blockage in her right leg, causing occasional pain in the thigh  area during exertion. The left leg does not cause pain, although it sometimes swells. She finds it challenging to exercise due to knee pain and financial constraints related to exercise programs.  She has  high blood pressure, which she attributes to a head injury in 2012, and has been on blood pressure medication since then. Home blood pressure readings are usually around 140, attributed to stress and lack of sleep.  She has a history of smoking, with increased frequency during bad weather.     Previous antihypertensives: Valsartan -  kidney decline, ineffective Hydralazine - swelling, increased urine output Chlorthalidone - kidney decline Isosorbide - ineffective Spironolactone - reported leg cramps  Arterial Dopplers 11/2023:   Summary:  Right: Resting right ankle-brachial index indicates moderate right lower  extremity arterial disease. The right toe-brachial index is abnormal.   Left: Resting left ankle-brachial index indicates moderate left lower  extremity arterial disease. The left toe-brachial index is abnormal.   ROS:  As per HPI  Studies Reviewed: .       Echo 07/2021: 1. Left ventricular ejection fraction, by estimation, is 70 to 75%. Left  ventricular ejection fraction by 3D volume is 72 %. The left ventricle has  hyperdynamic function. The left ventricle has no regional wall motion  abnormalities. Left ventricular  diastolic parameters are consistent with Grade I diastolic dysfunction  (impaired relaxation).   2. Right ventricular systolic function is normal. The right ventricular  size is normal. There is normal pulmonary artery systolic pressure. The  estimated right ventricular systolic pressure is 33.9 mmHg.   3. Left atrial size was mildly dilated.   4. The mitral valve is normal in structure. No evidence of mitral valve  regurgitation. No evidence of mitral stenosis.   5. The aortic valve is calcified. Aortic valve regurgitation is not  visualized. Mild to moderate aortic valve sclerosis/calcification is  present, without any evidence of aortic stenosis.   6. The inferior vena cava is normal in size with greater than 50%  respiratory variability, suggesting  right atrial pressure of 3 mmHg.   Risk Assessment/Calculations:        Physical Exam:   VS:  BP (!) 142/64   Pulse 67   Ht 5\' 10"  (1.778 m)   Wt 172 lb (78 kg)   SpO2 95%   BMI 24.68 kg/m  , BMI Body mass index is 24.68 kg/m. GENERAL:  Well appearing HEENT: Pupils equal round and reactive, fundi not visualized, oral mucosa unremarkable NECK:  No jugular venous distention, waveform within normal limits, carotid upstroke brisk and symmetric, no bruits, no thyromegaly LUNGS:  Clear to auscultation bilaterally HEART:  RRR.  PMI not displaced or sustained,S1 and S2 within normal limits, no S3, no S4, no clicks, no rubs, no murmurs ABD:  Flat, positive bowel sounds normal in frequency in pitch, no bruits, no rebound, no guarding, no midline pulsatile mass, no hepatomegaly, no splenomegaly EXT:  Noo edema, no cyanosis no clubbing SKIN:  No rashes no nodules NEURO:  Cranial nerves II through XII grossly intact, motor grossly intact throughout PSYCH:  Cognitively intact, oriented to person place and time   ASSESSMENT AND PLAN: .    # Hyperlipidemia Patient reports discomfort in the abdominal region since starting Atorvastatin. Patient has previously experienced muscle cramps with a different statin. Discussed the importance of cholesterol control given the patient's history of arterial blockages. -Discontinue Atorvastatin. -Provide samples of Repatha or Praluent for trial.  # Gastroesophageal Reflux Disease (GERD) Increased symptoms  possibly related to Aspirin use. -Hold Aspirin for a week to assess if symptoms improve.  # Possible Urinary Tract Infection (UTI) Patient reports increased urinary frequency and odor. Patient does not believe it is a UTI based on previous experience. -Collect urine sample for urinalysis to rule out UTI.  # Hypertension Blood pressure measured at 142/64, which is above the target of 130/80. Patient reports lower readings at home. Discussed the importance  of blood pressure control given the patient's history of arterial blockages. -Continue current management.  # Arthritis Patient reports knee pain, particularly in the right knee. Pain is managed with periodic injections by Dr. August Saucer. -Consider stronger strength of pain cream. -Consider lidocaine patches for knee pain. -Encourage low-impact exercise such as using an exercise bike.  # Smoking Patient continues to smoke. Discussed the increased risk of arterial blockages, heart attacks, and strokes. -Continue to encourage smoking cessation.  Follow-up -Check cholesterol levels. -Reassess blood pressure control. -Review results of urine analysis. -Consider knee injections if pain worsens.      Dispo: f/u 6 months  Signed, Chilton Si, MD

## 2024-01-11 LAB — URINALYSIS
Glucose, UA: NEGATIVE
Nitrite, UA: NEGATIVE
RBC, UA: NEGATIVE
Specific Gravity, UA: 1.027 (ref 1.005–1.030)
Urobilinogen, Ur: 1 mg/dL (ref 0.2–1.0)
pH, UA: 5.5 (ref 5.0–7.5)

## 2024-01-13 ENCOUNTER — Ambulatory Visit (HOSPITAL_COMMUNITY): Payer: Medicare Other

## 2024-01-15 ENCOUNTER — Ambulatory Visit (HOSPITAL_COMMUNITY): Payer: Medicare Other

## 2024-01-17 ENCOUNTER — Ambulatory Visit (HOSPITAL_COMMUNITY): Payer: Medicare Other

## 2024-01-19 ENCOUNTER — Encounter: Payer: Self-pay | Admitting: Internal Medicine

## 2024-01-20 ENCOUNTER — Ambulatory Visit (HOSPITAL_COMMUNITY): Payer: Medicare Other

## 2024-01-21 ENCOUNTER — Ambulatory Visit: Payer: Medicare Other | Admitting: Cardiovascular Disease

## 2024-01-22 ENCOUNTER — Ambulatory Visit (HOSPITAL_COMMUNITY): Payer: Medicare Other

## 2024-01-24 ENCOUNTER — Ambulatory Visit (HOSPITAL_COMMUNITY): Payer: Medicare Other

## 2024-01-27 ENCOUNTER — Ambulatory Visit (HOSPITAL_COMMUNITY): Payer: Medicare Other

## 2024-01-29 ENCOUNTER — Encounter (HOSPITAL_BASED_OUTPATIENT_CLINIC_OR_DEPARTMENT_OTHER): Payer: Self-pay | Admitting: Cardiovascular Disease

## 2024-01-29 ENCOUNTER — Ambulatory Visit (HOSPITAL_COMMUNITY): Payer: Medicare Other

## 2024-01-31 ENCOUNTER — Ambulatory Visit (HOSPITAL_COMMUNITY): Payer: Medicare Other

## 2024-02-03 ENCOUNTER — Ambulatory Visit (HOSPITAL_COMMUNITY): Payer: Medicare Other

## 2024-02-05 ENCOUNTER — Ambulatory Visit (HOSPITAL_COMMUNITY): Payer: Medicare Other

## 2024-02-07 ENCOUNTER — Ambulatory Visit (HOSPITAL_COMMUNITY): Payer: Medicare Other

## 2024-02-10 ENCOUNTER — Ambulatory Visit (HOSPITAL_COMMUNITY): Payer: Medicare Other

## 2024-02-12 ENCOUNTER — Ambulatory Visit (HOSPITAL_COMMUNITY): Payer: Medicare Other

## 2024-02-14 ENCOUNTER — Ambulatory Visit (HOSPITAL_COMMUNITY): Payer: Medicare Other

## 2024-02-17 ENCOUNTER — Ambulatory Visit (HOSPITAL_COMMUNITY): Payer: Medicare Other

## 2024-02-19 ENCOUNTER — Ambulatory Visit (HOSPITAL_COMMUNITY): Payer: Medicare Other

## 2024-02-21 ENCOUNTER — Ambulatory Visit (HOSPITAL_COMMUNITY): Payer: Medicare Other

## 2024-02-24 ENCOUNTER — Ambulatory Visit (HOSPITAL_COMMUNITY): Payer: Medicare Other

## 2024-02-26 ENCOUNTER — Ambulatory Visit (HOSPITAL_COMMUNITY): Payer: Medicare Other

## 2024-02-28 ENCOUNTER — Ambulatory Visit (HOSPITAL_COMMUNITY): Payer: Medicare Other

## 2024-03-02 ENCOUNTER — Ambulatory Visit (HOSPITAL_COMMUNITY): Payer: Medicare Other

## 2024-03-04 ENCOUNTER — Ambulatory Visit (HOSPITAL_COMMUNITY): Payer: Medicare Other

## 2024-03-06 ENCOUNTER — Ambulatory Visit (HOSPITAL_COMMUNITY): Payer: Medicare Other

## 2024-03-09 ENCOUNTER — Ambulatory Visit (HOSPITAL_COMMUNITY): Payer: Medicare Other

## 2024-03-11 ENCOUNTER — Ambulatory Visit (HOSPITAL_COMMUNITY): Payer: Medicare Other

## 2024-03-13 ENCOUNTER — Ambulatory Visit (HOSPITAL_COMMUNITY): Payer: Medicare Other

## 2024-06-30 ENCOUNTER — Encounter: Payer: Self-pay | Admitting: Cardiovascular Disease

## 2024-06-30 ENCOUNTER — Ambulatory Visit: Attending: Cardiovascular Disease | Admitting: Cardiovascular Disease

## 2024-06-30 VITALS — BP 120/60 | HR 68 | Ht 70.0 in | Wt 170.0 lb

## 2024-06-30 DIAGNOSIS — K551 Chronic vascular disorders of intestine: Secondary | ICD-10-CM

## 2024-06-30 DIAGNOSIS — I6523 Occlusion and stenosis of bilateral carotid arteries: Secondary | ICD-10-CM | POA: Diagnosis not present

## 2024-06-30 DIAGNOSIS — E785 Hyperlipidemia, unspecified: Secondary | ICD-10-CM

## 2024-06-30 DIAGNOSIS — I701 Atherosclerosis of renal artery: Secondary | ICD-10-CM | POA: Diagnosis not present

## 2024-06-30 DIAGNOSIS — I2510 Atherosclerotic heart disease of native coronary artery without angina pectoris: Secondary | ICD-10-CM

## 2024-06-30 DIAGNOSIS — I739 Peripheral vascular disease, unspecified: Secondary | ICD-10-CM

## 2024-06-30 DIAGNOSIS — Z720 Tobacco use: Secondary | ICD-10-CM

## 2024-06-30 MED ORDER — ATORVASTATIN CALCIUM 20 MG PO TABS: 20.0000 mg | ORAL_TABLET | Freq: Every day | ORAL | 3 refills | Status: DC

## 2024-06-30 NOTE — Patient Instructions (Signed)
 Medication Instructions:  No changes *If you need a refill on your cardiac medications before your next appointment, please call your pharmacy*  Lab Work: Your provider would like for you to have the following labs today: Lipid and Liver  If you have labs (blood work) drawn today and your tests are completely normal, you will receive your results only by: MyChart Message (if you have MyChart) OR A paper copy in the mail If you have any lab test that is abnormal or we need to change your treatment, we will call you to review the results.  Testing/Procedures: Your physician has requested that you have a carotid duplex. This test is an ultrasound of the carotid arteries in your neck. It looks at blood flow through these arteries that supply the brain with blood. Allow one hour for this exam. There are no restrictions or special instructions. This will take place at [address] 4th floor  Please note: We ask at that you not bring children with you during ultrasound (echo/ vascular) testing. Due to room size and safety concerns, children are not allowed in the ultrasound rooms during exams. Our front office staff cannot provide observation of children in our lobby area while testing is being conducted. An adult accompanying a patient to their appointment will only be allowed in the ultrasound room at the discretion of the ultrasound technician under special circumstances. We apologize for any inconvenience.   Follow-Up: At [facility], you and your health needs are our priority.  As part of our continuing mission to provide you with exceptional heart care, our providers are all part of one team.  This team includes your primary Cardiologist (physician) and Advanced Practice Providers or APPs (Physician Assistants and Nurse Practitioners) who all work together to provide you with the care you need, when you need it.  Your next appointment:   12 month(s)  Provider:   Dr. Darron  We  recommend signing up for the patient portal called MyChart.  Sign up information is provided on this After Visit Summary.  MyChart is used to connect with patients for Virtual Visits (Telemedicine).  Patients are able to view lab/test results, encounter notes, upcoming appointments, etc.  Non-urgent messages can be sent to your provider as well.   To learn more about what you can do with MyChart, go to [email or web address].

## 2024-06-30 NOTE — Progress Notes (Signed)
 Cardiology Office Note   Date:  06/30/2024   ID:  Christina, Branch 06-Sep-1941, MRN [id number]  PCP:  Hillman Bare, MD  Cardiologist:  Dr. Raford  No chief complaint on file.     History of Present Illness: Christina Branch is a 83 y.o. female who is here today for a follow-up visit regarding renal artery stenosis and peripheral arterial disease.   She has known history of hypertension, hyperlipidemia, COPD, tobacco use, GERD and aortic valve calcifications. Echocardiogram in 2022 showed normal LV systolic function.  Previous CT imaging showed coronary and aortic atherosclerosis as well as adrenal adenoma. She has known history of resistant hypertension.  Workup for secondary hypertension was negative.  She has intolerance to antihypertensive medications.  Renal artery duplex in October 2024 showed borderline bilateral renal artery stenosis with peak velocity of 318 on the right and 250 on the left.  Kidney size was equal on both sides at 10 cm.  I recommended medical therapy for renal artery stenosis given that her blood pressure was controlled on 2 medications. She did complain of bilateral leg claudication that was worse on the right side with abnormal vascular exam.  I requested lower extremity arterial Doppler studies which showed an ABI of 0.58 on the right and 0.77 on the left. Duplex on the right showed occluded proximal SFA.  On the left, there was severe stenosis in the mid common femoral artery.  She was referred to a structured exercise therapy program but did not attend due to $120 weekly co-pay.  Instead, she purchased a home exercise machine and has been doing well with that.  She has no severe leg claudication.  No lower extremity ulceration.  She does have some swelling in the lower extremities but she is on high dose amlodipine . She initially complained of abdominal pain with atorvastatin  but then resumed the medication with no issues.   Past Medical History:   Diagnosis Date   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   Claudication [facility])    Coronary artery calcification 03/19/2022   DJD (degenerative joint disease) of knee    Essential hypertension 03/19/2022   GERD (gastroesophageal reflux disease)    H/O: varicose veins    Right Leg   Hyperlipidemia    Hypertension    Hypoxemia    Myalgia and myositis, unspecified    Pain in limb    Palpitations 07/04/2022   Peripheral neuropathy 03/07/2020   Varicose veins     Past Surgical History:  Procedure Laterality Date   CHOLECYSTECTOMY     Open cholecystectomy   COLON SURGERY     partial resection due to large polyps (Benign)   FRACTURE SURGERY  1953   ORIF right hip   HEMORRHOID SURGERY     INGUINAL HERNIA REPAIR N/A 08/31/2021   Procedure: PRIMARY REPAIR INCISIONAL HERNIA; EXCISION OF ABDOMINAL SKIN TAG;  Surgeon: Vanderbilt Ned, MD;  Location: [facility]S;  Service: General;  Laterality: N/A;   LAPAROTOMY N/A 08/31/2021   Procedure: EXPLORATORY LAPAROTOMY;  Surgeon: Vanderbilt Ned, MD;  Location: [facility];  Service: General;  Laterality: N/A;     Current Outpatient Medications  Medication Sig Dispense Refill   amLODipine  (NORVASC ) 10 MG tablet Take 10 mg by mouth daily.      aspirin  EC 81 MG tablet Take 1 tablet (81 mg total) by mouth daily. Swallow whole.     atorvastatin  (LIPITOR) 20 MG tablet Take 20 mg by mouth daily.     doxazosin  (CARDURA )  2 MG tablet TAKE 1 TABLET(2 MG) BY MOUTH DAILY 90 tablet 3   HYDROcodone -ibuprofen  (VICOPROFEN) 7.5-200 MG per tablet Take 1 tablet by mouth every 6 (six) hours as needed for moderate pain.      loratadine (CLARITIN) 10 MG tablet Take 10 mg by mouth daily as needed for allergies.  4   Multiple Vitamins-Minerals (CENTRUM SILVER PO) Take 1 tablet by mouth daily.      Vitamin D, Ergocalciferol, (DRISDOL) 50000 units CAPS capsule Take 50,000 Units by mouth every 7 (seven) days.  4   diclofenac  Sodium (VOLTAREN ) 1 % GEL Apply 2 g topically daily as needed  (pain). (Patient not taking: Reported on 06/30/2024)     Evolocumab  (REPATHA  SURECLICK) 140 MG/ML SOAJ Inject 140 mg into the skin every 14 (fourteen) days. (Patient not taking: Reported on 06/30/2024)     No current facility-administered medications for this visit.    Allergies:   Atorvastatin , Penicillins, Rosuvastatin , Shellfish allergy, Sulfa antibiotics, and Tuna [fish allergy]    Social History:  The patient  reports that she has been smoking cigarettes. She started smoking about 61 years ago. She has a 30.8 pack-year smoking history. She has never used smokeless tobacco. She reports that she does not drink alcohol and does not use drugs.   Family History:  The patient's family history includes Breast cancer in her sister; Cancer in her mother; Hypertension in her son.    ROS:  Please see the history of present illness.   Otherwise, review of systems are positive for none.   All other systems are reviewed and negative.    PHYSICAL EXAM: VS:  BP 120/60   Pulse 68   Ht 5' 10 (1.778 m)   Wt 170 lb (77.1 kg)   SpO2 97%   BMI 24.39 kg/m  , BMI Body mass index is 24.39 kg/m. GEN: Well nourished, well developed, in no acute distress  HEENT: normal  Neck: no JVD, carotid bruits, or masses Cardiac: RRR; no rubs, or gallops,no edema .  1 /6 systolic murmur in the aortic area. Respiratory:  clear to auscultation bilaterally, normal work of breathing GI: soft, nontender, nondistended, + BS MS: no deformity or atrophy  Skin: warm and dry, no rash Neuro:  Strength and sensation are intact Psych: euthymic mood, full affect Vascular: Dorsalis pedis is not palpable on the right and +2 on the left.  Posterior tibial is not palpable bilaterally   EKG:  EKG is ordered today. EKG showed: Normal sinus rhythm Normal ECG     Recent Labs: No results found for requested labs within last 365 days.    Lipid Panel    Component Value Date/Time   CHOL 191 06/21/2022 1621   TRIG 116  06/21/2022 1621   HDL 63 06/21/2022 1621   CHOLHDL 3.0 06/21/2022 1621   LDLCALC 108 (H) 06/21/2022 1621      Wt Readings from Last 3 Encounters:  06/30/24 170 lb (77.1 kg)  01/10/24 172 lb (78 kg)  12/10/23 171 lb 9.6 oz (77.8 kg)          No data to display            ASSESSMENT AND PLAN:  1.  Renal artery stenosis: She has borderline stenosis in bilateral renal arteries which is not critical.  Her blood pressure is controlled on 2 medications and she has no chronic kidney disease.  No indication for revascularization.  Recommend continuing medical therapy.  2.  Peripheral arterial disease with  moderate claudication: She has claudication that does not seem to be lifestyle limiting and improved with exercise.  Continue treatment of risk factors and low-dose aspirin .  3.  Carotid artery disease: Carotid Doppler showed borderline 40% stenosis in the left side.  I requested a follow-up carotid Doppler.  4.  Mesenteric artery stenosis: Completely asymptomatic with no evidence of chronic mesenteric ischemia.  5.  Hyperlipidemia: Her LDL was 107 last year.  She is taking atorvastatin  20 mg once daily.  She initially complained of abdominal pain but her symptoms resolved.  Check lipid and liver profile today  6.  Tobacco use: She cut down significantly and some days she does not smoke at all.  I discussed the importance of abstinence.  7.  Coronary atherosclerosis.  No angina at the present time.   Disposition:   FU with me in 12 months  Signed,  Deatrice Cage, MD  06/30/2024 8:24 AM    [facility]

## 2024-07-01 ENCOUNTER — Ambulatory Visit: Payer: Self-pay | Admitting: *Deleted

## 2024-07-01 LAB — HEPATIC FUNCTION PANEL
ALT: 17 IU/L (ref 0–32)
AST: 22 IU/L (ref 0–40)
Albumin: 3.8 g/dL (ref 3.7–4.7)
Alkaline Phosphatase: 80 IU/L (ref 44–121)
Bilirubin Total: 0.4 mg/dL (ref 0.0–1.2)
Bilirubin, Direct: 0.15 mg/dL (ref 0.00–0.40)
Total Protein: 5.7 g/dL — ABNORMAL LOW (ref 6.0–8.5)

## 2024-07-01 LAB — LIPID PANEL
Chol/HDL Ratio: 2.1 ratio (ref 0.0–4.4)
Cholesterol, Total: 137 mg/dL (ref 100–199)
HDL: 65 mg/dL (ref >39)
LDL Chol Calc (NIH): 56 mg/dL (ref 0–99)
Triglycerides: 85 mg/dL (ref 0–149)
VLDL Cholesterol Cal: 16 mg/dL (ref 5–40)

## 2024-07-07 ENCOUNTER — Ambulatory Visit (HOSPITAL_COMMUNITY)
Admission: RE | Admit: 2024-07-07 | Discharge: 2024-07-07 | Disposition: A | Discharge status: Home / Self Care | Source: Ambulatory Visit | Attending: Cardiovascular Disease | Admitting: Cardiovascular Disease

## 2024-07-07 DIAGNOSIS — I6523 Occlusion and stenosis of bilateral carotid arteries: Secondary | ICD-10-CM | POA: Diagnosis not present

## 2024-09-18 ENCOUNTER — Encounter (HOSPITAL_BASED_OUTPATIENT_CLINIC_OR_DEPARTMENT_OTHER): Payer: Self-pay | Admitting: Family

## 2024-09-18 ENCOUNTER — Ambulatory Visit (HOSPITAL_BASED_OUTPATIENT_CLINIC_OR_DEPARTMENT_OTHER): Admitting: Family

## 2024-09-18 VITALS — BP 126/78 | HR 61 | Ht 70.0 in | Wt 165.3 lb

## 2024-09-18 DIAGNOSIS — I358 Other nonrheumatic aortic valve disorders: Secondary | ICD-10-CM

## 2024-09-18 DIAGNOSIS — I25118 Atherosclerotic heart disease of native coronary artery with other forms of angina pectoris: Secondary | ICD-10-CM | POA: Diagnosis not present

## 2024-09-18 DIAGNOSIS — I739 Peripheral vascular disease, unspecified: Secondary | ICD-10-CM | POA: Diagnosis not present

## 2024-09-18 NOTE — Patient Instructions (Signed)
 Medication Instructions:  Continue your current medications *If you need a refill on your cardiac medications before your next appointment, please call your pharmacy*  Lab Work: Your cholesterol in August looked well controlled on Atorvastatin  in August.   Follow-Up: At [facility], you and your health needs are our priority.  As part of our continuing mission to provide you with exceptional heart care, our providers are all part of one team.  This team includes your primary Cardiologist (physician) and Advanced Practice Providers or APPs (Physician Assistants and Nurse Practitioners) who all work together to provide you with the care you need, when you need it.  Your next appointment:   1 year(s)  Provider:   Annabella Scarce, MD or Reche GORMAN Finder, NP    We recommend signing up for the patient portal called MyChart.  Sign up information is provided on this After Visit Summary.  MyChart is used to connect with patients for Virtual Visits (Telemedicine).  Patients are able to view lab/test results, encounter notes, upcoming appointments, etc.  Non-urgent messages can be sent to your provider as well.   To learn more about what you can do with MyChart, go to [email or web address].   Other Instructions

## 2024-09-18 NOTE — Progress Notes (Signed)
 Cardiology Office Note:  .   Date:  09/18/2024  ID:  Christina Branch, DOB 01-28-1941, MRN [id number] PCP: Hillman Bare, MD  [facility] Providers Cardiologist:  Annabella Scarce, MD PV Cardiologist:  Deatrice Cage, MD    History of Present Illness: .   Christina Branch is a 83 y.o. female with a hx of  hypertension, hyperlipidemia, emphysema, tobacco abuse, GERD, aortic valve calcification last seen 03/11/23   She was seen in consult 03/19/2022 by Dr. Scarce at the request of pulmonology.  Echo 07/2021 LVEF 70%, grade 1 diastolic dysfunction, normal pulmonary pressures.  Chest CT showed coronary and aortic atherosclerosis as well as adrenal adenoma.  She noted bilateral feet swelling with right greater than left which improved with elevation. Unable to take fluid pill due to incontinence. Smokes 1 pack of cigarettes every 3 to 4 days.  Her BP was not well controlled and she was started on valsartan  160 mg daily.  Rosuvastatin  10 mg daily to achieve LDL goal less than 70.   At follow-up 03/2022 valsartan  dose increased as blood pressure not at goal.  A follow-up 06/2022 BP remained above goal at home 140s - started on 25 mg of hydrochlorothiazide .  Isosorbide  discontinued.  Rosuvastatin  held due to myalgias.  She called the office 07/27/2022 noting blood pressure not well controlled on hydrochlorothiazide -it was discontinued and she was started on spironolactone  25 mg daily.   Monitor 07/2022  due to palpitations with predominantly normal sinus rhythm with rare PVC/PAC and 20 episodes of SVT which were brief.  No evidence of atrial fibrillation.  She was unaware of palpitations and politely declined addition of metoprolol.   Lab work for secondary hypertension 06/2022 including TSH, renin-aldosterone, catecholamines, metanephrines were unremarkable.    Seen 08/07/22. She was started on Chlorthalidone  but had to be stopped due to decline in renal function. Carotid duplex  with bilateral 1-39%  stenosis in carotid arteries and 70-99% stenosis in mesenteric arteries which was asymptomatic.    At visit 08/2022 BP again markedly elevated.  Valsartan , Imdur  previously ineffective.  Did not tolerate hydralazine .  Chlorthalidone  discontinued due to renal function.  BP at home was in the 140s though in clinic was 180s.  She declined additional medical therapy at that time.  At visit 03/11/23 Noted leg cramping on Spironolactone  (despite being on it for 6 months) so it was stopped and Doxazosin  2mg  at bedtime initiated.   Updated carotid 06/2024 bilateral 1-39% stenosis.   Presents today for follow up. Has not been monitoring BP at home. Attributes ~5 lb weight loss to appetite decrease which has since improved. Numbness in bilaterall legs when she lays down. Particularly in her feet. No leg pain with ambulation. Exercising on elliptical and by walking. Rare lower extremity which resolves with elevation. Notes she never took repatha  as had concerns about side effects. She is taking atorvastatin  20mg  daily routinely and tolerating.    Previous antihypertensives: Valsartan  -  kidney decline, ineffective Hydralazine  - swelling, increased urine output Chlorthalidone  - kidney decline Isosorbide  - ineffective Spironolactone  - reported leg cramps  ROS: Please see the history of present illness.    All other systems reviewed and are negative.   Studies Reviewed: .        Cardiac Studies & Procedures   ______________________________________________________________________________________________     ECHOCARDIOGRAM  ECHOCARDIOGRAM COMPLETE 08/21/2021  Narrative ECHOCARDIOGRAM REPORT    Patient Name:   Christina Branch Date of Exam: 08/21/2021 Medical Rec #:  [id number]  Height:       70.0 in Accession #:    [id number]     Weight:       179.6 lb Date of Birth:  October 24, 1941       BSA:          1.994 m Patient Age:    80 years       BP:           148/70 mmHg Patient Gender: F               HR:           55 bpm. Exam Location:  [address]  Procedure: 2D Echo, 3D Echo, Cardiac Doppler and Color Doppler  Indications:    R06.00 Dyspnea  History:        Patient has no prior history of Echocardiogram examinations. Signs/Symptoms:Edema; Risk Factors:Current Smoker, Hypertension and Dyslipidemia.  Sonographer:    Heather Hawks RDCS Referring Phys: DORETHIA CAVE  IMPRESSIONS   1. Left ventricular ejection fraction, by estimation, is 70 to 75%. Left ventricular ejection fraction by 3D volume is 72 %. The left ventricle has hyperdynamic function. The left ventricle has no regional wall motion abnormalities. Left ventricular diastolic parameters are consistent with Grade I diastolic dysfunction (impaired relaxation). 2. Right ventricular systolic function is normal. The right ventricular size is normal. There is normal pulmonary artery systolic pressure. The estimated right ventricular systolic pressure is 33.9 mmHg. 3. Left atrial size was mildly dilated. 4. The mitral valve is normal in structure. No evidence of mitral valve regurgitation. No evidence of mitral stenosis. 5. The aortic valve is calcified. Aortic valve regurgitation is not visualized. Mild to moderate aortic valve sclerosis/calcification is present, without any evidence of aortic stenosis. 6. The inferior vena cava is normal in size with greater than 50% respiratory variability, suggesting right atrial pressure of 3 mmHg.  FINDINGS Left Ventricle: Left ventricular ejection fraction, by estimation, is 70 to 75%. Left ventricular ejection fraction by 3D volume is 72 %. The left ventricle has hyperdynamic function. The left ventricle has no regional wall motion abnormalities. The left ventricular internal cavity size was normal in size. There is no left ventricular hypertrophy. Left ventricular diastolic parameters are consistent with Grade I diastolic dysfunction (impaired relaxation). Normal left ventricular  filling pressure.  Right Ventricle: The right ventricular size is normal. No increase in right ventricular wall thickness. Right ventricular systolic function is normal. There is normal pulmonary artery systolic pressure. The tricuspid regurgitant velocity is 2.78 m/s, and with an assumed right atrial pressure of 3 mmHg, the estimated right ventricular systolic pressure is 33.9 mmHg.  Left Atrium: Left atrial size was mildly dilated.  Right Atrium: Right atrial size was normal in size.  Pericardium: There is no evidence of pericardial effusion.  Mitral Valve: The mitral valve is normal in structure. Mild mitral annular calcification. No evidence of mitral valve regurgitation. No evidence of mitral valve stenosis.  Tricuspid Valve: The tricuspid valve is normal in structure. Tricuspid valve regurgitation is trivial. No evidence of tricuspid stenosis.  Aortic Valve: The aortic valve is calcified. Aortic valve regurgitation is not visualized. Mild to moderate aortic valve sclerosis/calcification is present, without any evidence of aortic stenosis.  Pulmonic Valve: The pulmonic valve was normal in structure. Pulmonic valve regurgitation is not visualized. No evidence of pulmonic stenosis.  Aorta: The aortic root is normal in size and structure.  Venous: The inferior vena cava is normal in size with greater than 50% respiratory  variability, suggesting right atrial pressure of 3 mmHg.  IAS/Shunts: No atrial level shunt detected by color flow Doppler.   LEFT VENTRICLE PLAX 2D LVIDd:         3.80 cm         Diastology LVIDs:         1.80 cm         LV e' medial:    6.53 cm/s LV PW:         0.80 cm         LV E/e' medial:  11.6 LV IVS:        0.90 cm         LV e' lateral:   13.70 cm/s LVOT diam:     2.20 cm         LV E/e' lateral: 5.5 LV SV:         92 LV SV Index:   46 LVOT Area:     3.80 cm        3D Volume EF LV 3D EF:    Left ventricul ar ejection fraction by 3D volume is 72  %.  3D Volume EF: 3D EF:        72 % LV EDV:       151 ml LV ESV:       42 ml LV SV:        110 ml  RIGHT VENTRICLE RV S prime:     13.20 cm/s TAPSE (M-mode): 1.9 cm  LEFT ATRIUM             Index       RIGHT ATRIUM           Index LA diam:        3.20 cm 1.60 cm/m  RA Area:     19.60 cm LA Vol (A2C):   65.8 ml 33.00 ml/m RA Volume:   54.00 ml  27.08 ml/m LA Vol (A4C):   68.4 ml 34.30 ml/m LA Biplane Vol: 70.2 ml 35.21 ml/m AORTIC VALVE LVOT Vmax:   99.70 cm/s LVOT Vmean:  68.600 cm/s LVOT VTI:    0.243 m  AORTA Ao Root diam: 3.10 cm Ao Asc diam:  3.45 cm  MITRAL VALVE                TRICUSPID VALVE MV Area (PHT): cm          TR Peak grad:   30.9 mmHg MV Decel Time: 280 msec     TR Vmax:        278.00 cm/s MV E velocity: 75.65 cm/s MV A velocity: 110.50 cm/s  SHUNTS MV E/A ratio:  0.68         Systemic VTI:  0.24 m Systemic Diam: 2.20 cm  Wilbert Bihari MD Electronically signed by Wilbert Bihari MD Signature Date/Time: 08/21/2021/3:50:22 PM    Final    MONITORS  LONG TERM MONITOR (3-14 DAYS) 07/31/2022  Narrative 14 Day Zio Monitor  Quality: Fair.  Baseline artifact. Predominant rhythm: sinus rhythm Average heart rate: 60 bpm Max heart rate: 121 bpm Min heart rate: 40 bpm Pauses >2.5 seconds: none  Occasional PACs 1.5% Occasional PVCs 1.8% Rare ventricular bigeminy  20 episodes of SVT up to 13 beats.  Maximum rate 190 bpm   Tiffany C. Raford, MD, [facility] 08/19/2022 2:53 PM       ______________________________________________________________________________________________        Risk Assessment/Calculations:  Physical Exam:   VS:  BP 126/78   Pulse 61   Ht 5' 10 (1.778 m)   Wt 165 lb 4.8 oz (75 kg)   SpO2 97%   BMI 23.72 kg/m    Wt Readings from Last 3 Encounters:  09/18/24 165 lb 4.8 oz (75 kg)  06/30/24 170 lb (77.1 kg)  01/10/24 172 lb (78 kg)    GEN: Well nourished, well developed in no acute distress NECK: No  JVD; No carotid bruits CARDIAC: RRR, no rubs, gallops. Gr 2/6 murmur. RESPIRATORY:  Clear to auscultation without rales, wheezing or rhonchi  ABDOMEN: Soft, non-tender, non-distended EXTREMITIES:  No edema; No deformity   ASSESSMENT AND PLAN: .     CAD /aortic atherosclerosis /HLD-noted by CT scan.  Predominantly LAD calcification. Stable with no anginal symptoms. No indication for ischemic evaluation.   06/2024 LDL 56. Continue atorvastatin  20mg  daily. She never started Repatha  due to concerns about side effects, as LDL at goal will not start.  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  Gradients normal by echo 07/2021.  Slight murmur stable on exam.  No indication for repeat echo at this time.   HTN - BP well controlled. Continue current antihypertensive regimen.  Doxaozsin 2mg  daily, amlodipine  10mg  daily.  06/2022 TSH, metanephrines, catecholamines unremarkable. 08/2022 no renal artery stenosis. 08/2022 bilateral 1-39% stenosis   Carotid and mesenteric artery stenosis / Carotid stenosis- Follows with Dr. Darron. Stable by most recent imaging. No new claudication.   Tobacco abuse - Smoking cessation encouraged. . Recommend utilization of [id number].         Dispo: follow up in 12 months  Signed, Reche GORMAN Finder, NP

## 2024-12-05 ENCOUNTER — Other Ambulatory Visit: Payer: Self-pay | Admitting: Cardiovascular Disease

## 2024-12-05 ENCOUNTER — Other Ambulatory Visit (HOSPITAL_BASED_OUTPATIENT_CLINIC_OR_DEPARTMENT_OTHER): Payer: Self-pay | Admitting: Cardiovascular Disease

## 2024-12-05 DIAGNOSIS — I10 Essential (primary) hypertension: Secondary | ICD-10-CM
